# Patient Record
Sex: Female | Born: 1950 | Race: Black or African American | Hispanic: No | Smoking: Never smoker
Health system: Southern US, Community
[De-identification: ages and names within clinical notes are randomized; demographics above are authoritative.]

## PROBLEM LIST (undated history)

## (undated) DIAGNOSIS — M2550 Pain in unspecified joint: Secondary | ICD-10-CM

## (undated) DIAGNOSIS — Z531 Procedure and treatment not carried out because of patient's decision for reasons of belief and group pressure: Secondary | ICD-10-CM

## (undated) DIAGNOSIS — R112 Nausea with vomiting, unspecified: Secondary | ICD-10-CM

## (undated) DIAGNOSIS — J329 Chronic sinusitis, unspecified: Secondary | ICD-10-CM

## (undated) DIAGNOSIS — K219 Gastro-esophageal reflux disease without esophagitis: Secondary | ICD-10-CM

## (undated) DIAGNOSIS — IMO0001 Reserved for inherently not codable concepts without codable children: Secondary | ICD-10-CM

## (undated) DIAGNOSIS — H6690 Otitis media, unspecified, unspecified ear: Secondary | ICD-10-CM

## (undated) DIAGNOSIS — Z9289 Personal history of other medical treatment: Secondary | ICD-10-CM

## (undated) DIAGNOSIS — T7840XA Allergy, unspecified, initial encounter: Secondary | ICD-10-CM

## (undated) DIAGNOSIS — M1711 Unilateral primary osteoarthritis, right knee: Secondary | ICD-10-CM

## (undated) DIAGNOSIS — Z8489 Family history of other specified conditions: Secondary | ICD-10-CM

## (undated) DIAGNOSIS — M542 Cervicalgia: Secondary | ICD-10-CM

## (undated) DIAGNOSIS — M1990 Unspecified osteoarthritis, unspecified site: Secondary | ICD-10-CM

## (undated) DIAGNOSIS — T4145XA Adverse effect of unspecified anesthetic, initial encounter: Secondary | ICD-10-CM

## (undated) DIAGNOSIS — M549 Dorsalgia, unspecified: Secondary | ICD-10-CM

## (undated) DIAGNOSIS — R030 Elevated blood-pressure reading, without diagnosis of hypertension: Secondary | ICD-10-CM

## (undated) DIAGNOSIS — B360 Pityriasis versicolor: Secondary | ICD-10-CM

## (undated) DIAGNOSIS — T8859XA Other complications of anesthesia, initial encounter: Secondary | ICD-10-CM

## (undated) DIAGNOSIS — R002 Palpitations: Secondary | ICD-10-CM

## (undated) DIAGNOSIS — I10 Essential (primary) hypertension: Secondary | ICD-10-CM

## (undated) DIAGNOSIS — E2839 Other primary ovarian failure: Secondary | ICD-10-CM

## (undated) DIAGNOSIS — Z9889 Other specified postprocedural states: Secondary | ICD-10-CM

## (undated) DIAGNOSIS — Z8619 Personal history of other infectious and parasitic diseases: Secondary | ICD-10-CM

## (undated) DIAGNOSIS — R7611 Nonspecific reaction to tuberculin skin test without active tuberculosis: Secondary | ICD-10-CM

## (undated) DIAGNOSIS — E7800 Pure hypercholesterolemia, unspecified: Secondary | ICD-10-CM

## (undated) DIAGNOSIS — B353 Tinea pedis: Secondary | ICD-10-CM

## (undated) HISTORY — PX: APPENDECTOMY: SHX54

## (undated) HISTORY — DX: Personal history of other infectious and parasitic diseases: Z86.19

## (undated) HISTORY — PX: BACK SURGERY: SHX140

## (undated) HISTORY — DX: Nonspecific reaction to tuberculin skin test without active tuberculosis: R76.11

## (undated) HISTORY — DX: Palpitations: R00.2

## (undated) HISTORY — DX: Other primary ovarian failure: E28.39

## (undated) HISTORY — DX: Pure hypercholesterolemia, unspecified: E78.00

## (undated) HISTORY — PX: JOINT REPLACEMENT: SHX530

## (undated) HISTORY — DX: Allergy, unspecified, initial encounter: T78.40XA

---
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
## 1993-09-18 HISTORY — PX: MENISCUS REPAIR: SHX5179

## 1995-09-19 HISTORY — PX: KNEE ARTHROSCOPY: SHX127

## 1996-09-18 HISTORY — PX: ABDOMINAL HYSTERECTOMY: SHX81

## 1997-09-18 HISTORY — PX: OTHER SURGICAL HISTORY: SHX169

## 1998-04-14 ENCOUNTER — Ambulatory Visit (HOSPITAL_COMMUNITY): Admission: RE | Admit: 1998-04-14 | Discharge: 1998-04-14 | Payer: Self-pay | Admitting: Infectious Diseases

## 2009-03-25 ENCOUNTER — Emergency Department (HOSPITAL_BASED_OUTPATIENT_CLINIC_OR_DEPARTMENT_OTHER): Admission: EM | Admit: 2009-03-25 | Discharge: 2009-03-25 | Payer: Self-pay | Admitting: Emergency Medicine

## 2009-09-20 ENCOUNTER — Emergency Department (HOSPITAL_BASED_OUTPATIENT_CLINIC_OR_DEPARTMENT_OTHER): Admission: EM | Admit: 2009-09-20 | Discharge: 2009-09-20 | Payer: Self-pay | Admitting: Emergency Medicine

## 2009-09-20 ENCOUNTER — Ambulatory Visit: Payer: Self-pay | Admitting: Diagnostic Radiology

## 2010-07-21 ENCOUNTER — Ambulatory Visit: Payer: Self-pay | Admitting: Diagnostic Radiology

## 2010-07-21 ENCOUNTER — Emergency Department (HOSPITAL_BASED_OUTPATIENT_CLINIC_OR_DEPARTMENT_OTHER): Admission: EM | Admit: 2010-07-21 | Discharge: 2010-07-21 | Payer: Self-pay | Admitting: Emergency Medicine

## 2010-08-06 ENCOUNTER — Ambulatory Visit: Payer: Self-pay | Admitting: Diagnostic Radiology

## 2010-08-06 ENCOUNTER — Emergency Department (HOSPITAL_BASED_OUTPATIENT_CLINIC_OR_DEPARTMENT_OTHER): Admission: EM | Admit: 2010-08-06 | Discharge: 2010-08-06 | Payer: Self-pay | Admitting: Emergency Medicine

## 2010-11-29 LAB — POCT CARDIAC MARKERS
Myoglobin, poc: 49.5 ng/mL (ref 12–200)
Myoglobin, poc: 58.8 ng/mL (ref 12–200)
Troponin i, poc: <0.05 ng/mL (ref 0.00–0.09)

## 2010-11-29 LAB — BASIC METABOLIC PANEL
BUN: 11 mg/dL (ref 6–23)
CO2: 25 mEq/L (ref 19–32)
Calcium: 9.4 mg/dL (ref 8.4–10.5)
Chloride: 108 mEq/L (ref 96–112)
Creatinine, Ser: 0.9 mg/dL (ref 0.4–1.2)
Glucose, Bld: 85 mg/dL (ref 70–99)

## 2010-11-29 LAB — DIFFERENTIAL
Basophils Relative: 1 % (ref 0–1)
Eosinophils Absolute: 0.3 10*3/uL (ref 0.0–0.7)
Eosinophils Relative: 5 % (ref 0–5)
Lymphs Abs: 1.9 10*3/uL (ref 0.7–4.0)
Neutrophils Relative %: 52 % (ref 43–77)

## 2010-11-29 LAB — CBC
MCH: 31.5 pg (ref 26.0–34.0)
MCHC: 34.4 g/dL (ref 30.0–36.0)
MCV: 91.5 fL (ref 78.0–100.0)
Platelets: 250 10*3/uL (ref 150–400)
RBC: 4.09 MIL/uL (ref 3.87–5.11)

## 2011-12-08 ENCOUNTER — Encounter (HOSPITAL_BASED_OUTPATIENT_CLINIC_OR_DEPARTMENT_OTHER): Payer: Self-pay | Admitting: *Deleted

## 2011-12-08 ENCOUNTER — Emergency Department (HOSPITAL_BASED_OUTPATIENT_CLINIC_OR_DEPARTMENT_OTHER)
Admission: EM | Admit: 2011-12-08 | Discharge: 2011-12-08 | Disposition: A | Discharge status: Home / Self Care | Payer: BC Managed Care – PPO | Attending: Emergency Medicine | Admitting: Emergency Medicine

## 2011-12-08 DIAGNOSIS — J069 Acute upper respiratory infection, unspecified: Secondary | ICD-10-CM | POA: Insufficient documentation

## 2011-12-08 MED ORDER — HYDROCODONE-ACETAMINOPHEN 5-325 MG PO TABS
1.0000 | ORAL_TABLET | Freq: Once | ORAL | Status: AC
  Administered 2011-12-08: 1 via ORAL
  Filled 2011-12-08: qty 1

## 2011-12-08 MED ORDER — AZITHROMYCIN 250 MG PO TABS: 250.0000 mg | ORAL_TABLET | Freq: Every day | ORAL | Status: AC

## 2011-12-08 MED ORDER — IBUPROFEN 600 MG PO TABS: 600.0000 mg | ORAL_TABLET | Freq: Three times a day (TID) | ORAL | Status: AC | PRN

## 2011-12-08 NOTE — ED Notes (Signed)
MD at bedside. 

## 2011-12-08 NOTE — ED Provider Notes (Signed)
History     CSN: [id number]  Arrival date & time 12/08/11  4098   First MD Initiated Contact with Patient 12/08/11 [phone]      Chief Complaint  Patient presents with  . Hypertension     The history is provided by the patient.   patient reports having mild headache and sinus pressure over the past several days.  She checked her blood pressure and found it to be in the 170s over 100 range.  She rejected this morning he continued to be elevated and that she presented the ER for evaluation.  She still reports sinus discomfort and nasal congestion.  She's had no fever or chills.  She denies no unilateral arm or leg weakness.  She has no changes in her vision.  She has no primary care doctor.  She has a family history of high blood pressure.  The pressure on arrival to the ER is 145/78.  She has no photophobia or phonophobia.  There's been no report of altered mental status or confusion.  No nausea or vomiting.  No chest pain or shortness of breath.  No exertional chest pain or shortness of breath.  Denies abdominal pain nausea vomiting or diarrhea.  She's had no fever chills.  Nothing worsens or improves her symptoms.  Her symptoms are constant.  She is very concerned about her high blood pressure  History reviewed. No pertinent past medical history.  History reviewed. No pertinent past surgical history.  History reviewed. No pertinent family history.  History  Substance Use Topics  . Smoking status: Never Smoker   . Smokeless tobacco: Not on file  . Alcohol Use:     OB History    Grav Para Term Preterm Abortions TAB SAB Ect Mult Living                  Review of Systems  All other systems reviewed and are negative.    Allergies  Review of patient's allergies indicates no known allergies.  Home Medications   Current Outpatient Rx  Name Route Sig Dispense Refill  . AZITHROMYCIN 250 MG PO TABS Oral Take 1 tablet (250 mg total) by mouth daily. Take 2 tabs for first dose, then 1  tab for each additional dose 6 tablet 0    BP 145/78  Pulse 81  Temp(Src) 98.1 F (36.7 C) (Oral)  Resp 20  Ht 5\' 5"  (1.651 m)  Wt 170 lb (77.111 kg)  BMI 28.29 kg/m2  SpO2 99%  Physical Exam  Nursing note and vitals reviewed. Constitutional: She is oriented to person, place, and time. She appears well-developed and well-nourished. No distress.  HENT:  Head: Normocephalic and atraumatic.  Right Ear: External ear normal.  Left Ear: External ear normal.  Nose: Nose normal.  Mouth/Throat: Oropharynx is clear and moist.       Mild tenderness of maxillary sinuses bilaterally  Eyes: EOM are normal.  Neck: Normal range of motion.  Cardiovascular: Normal rate, regular rhythm and normal heart sounds.   Pulmonary/Chest: Effort normal and breath sounds normal.  Abdominal: Soft. She exhibits no distension. There is no tenderness.  Musculoskeletal: Normal range of motion.  Neurological: She is alert and oriented to person, place, and time.  Skin: Skin is warm and dry.  Psychiatric: She has a normal mood and affect. Judgment normal.    ED Course  Procedures (including critical care time)  Labs Reviewed - No data to display No results found.   1. Upper respiratory tract  infection       MDM  Patient presents with symptoms consistent with upper respiratory tract infection.  She's convention and a sinus infection.  Him on the fence at this time.  She will be written a prescription for azithromycin.  She's been encouraged to followup with aprimary care physician and has been given numbers to contact them.  She understands the importance of recheck of her blood pressures in an office setting        Lyanne Co, MD 12/08/11 [phone]

## 2011-12-08 NOTE — ED Notes (Addendum)
Pt requests prescription for pain medication "vicodin" for "neck pain". Pt waiting in room at this time.

## 2011-12-08 NOTE — ED Notes (Signed)
Pt amb to room 7 with quick steady gait smiling in nad. Pt reports her bp this am at home was 180's over 120's. bp now is 145/78. Pt states she has no hx of htn and has had some headaches off and on over the last few weeks.

## 2011-12-19 ENCOUNTER — Ambulatory Visit: Payer: BC Managed Care – PPO | Admitting: Family

## 2011-12-25 ENCOUNTER — Ambulatory Visit (HOSPITAL_BASED_OUTPATIENT_CLINIC_OR_DEPARTMENT_OTHER)
Admission: RE | Admit: 2011-12-25 | Discharge: 2011-12-25 | Disposition: A | Discharge status: Home / Self Care | Payer: BC Managed Care – PPO | Source: Ambulatory Visit | Attending: Family | Admitting: Family

## 2011-12-25 ENCOUNTER — Ambulatory Visit (INDEPENDENT_AMBULATORY_CARE_PROVIDER_SITE_OTHER): Payer: BC Managed Care – PPO | Admitting: Family

## 2011-12-25 ENCOUNTER — Encounter: Payer: Self-pay | Admitting: Family

## 2011-12-25 VITALS — BP 128/76 | HR 81 | Temp 98.1°F | Resp 16 | Ht 65.0 in | Wt 182.1 lb

## 2011-12-25 DIAGNOSIS — M47812 Spondylosis without myelopathy or radiculopathy, cervical region: Secondary | ICD-10-CM

## 2011-12-25 DIAGNOSIS — R030 Elevated blood-pressure reading, without diagnosis of hypertension: Secondary | ICD-10-CM | POA: Insufficient documentation

## 2011-12-25 DIAGNOSIS — M542 Cervicalgia: Secondary | ICD-10-CM

## 2011-12-25 DIAGNOSIS — J329 Chronic sinusitis, unspecified: Secondary | ICD-10-CM | POA: Insufficient documentation

## 2011-12-25 DIAGNOSIS — Z9289 Personal history of other medical treatment: Secondary | ICD-10-CM

## 2011-12-25 DIAGNOSIS — Z228 Carrier of other infectious diseases: Secondary | ICD-10-CM

## 2011-12-25 DIAGNOSIS — J45909 Unspecified asthma, uncomplicated: Secondary | ICD-10-CM

## 2011-12-25 DIAGNOSIS — J4520 Mild intermittent asthma, uncomplicated: Secondary | ICD-10-CM | POA: Diagnosis present

## 2011-12-25 HISTORY — DX: Elevated blood-pressure reading, without diagnosis of hypertension: R03.0

## 2011-12-25 HISTORY — DX: Unspecified asthma, uncomplicated: J45.909

## 2011-12-25 HISTORY — DX: Cervicalgia: M54.2

## 2011-12-25 HISTORY — DX: Personal history of other medical treatment: Z92.89

## 2011-12-25 MED ORDER — AMOXICILLIN-POT CLAVULANATE 875-125 MG PO TABS: 1.0000 | ORAL_TABLET | Freq: Two times a day (BID) | ORAL | Status: DC

## 2011-12-25 MED ORDER — FLUTICASONE PROPIONATE 50 MCG/ACT NA SUSP: 2.0000 | Freq: Every day | NASAL | Status: DC

## 2011-12-25 MED ORDER — MELOXICAM 7.5 MG PO TABS: 7.5000 mg | ORAL_TABLET | Freq: Every day | ORAL | Status: AC

## 2011-12-25 NOTE — Assessment & Plan Note (Signed)
BP looks good today.  Will monitor.

## 2011-12-25 NOTE — Patient Instructions (Signed)
Please complete your x-ray on the first floor.  Please follow up in 1 month (fasting) for complete physical. Welcome to [facility]!

## 2011-12-25 NOTE — Assessment & Plan Note (Signed)
Plan short term trial of mobic.  Will also obtain cervical x-ray to further evaluate.  Suspect DDD.

## 2011-12-25 NOTE — Assessment & Plan Note (Signed)
She received treatment as a child and is monitored with serial chest x-rays.

## 2011-12-25 NOTE — Assessment & Plan Note (Signed)
Pt with acute on chronic sinusitis.  Will rx with augmentin.  Will also add zyrtec and flonase to help with her chronic symptoms.
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
## 2011-12-25 NOTE — Assessment & Plan Note (Signed)
Currently stable with PRN albuterol.

## 2012-03-19 ENCOUNTER — Encounter: Payer: Self-pay | Admitting: Family

## 2012-03-19 ENCOUNTER — Ambulatory Visit (HOSPITAL_BASED_OUTPATIENT_CLINIC_OR_DEPARTMENT_OTHER)
Admission: RE | Admit: 2012-03-19 | Discharge: 2012-03-19 | Disposition: A | Discharge status: Home / Self Care | Payer: BC Managed Care – PPO | Source: Ambulatory Visit | Attending: Family | Admitting: Family

## 2012-03-19 ENCOUNTER — Other Ambulatory Visit: Payer: Self-pay | Admitting: Family

## 2012-03-19 ENCOUNTER — Encounter: Payer: Self-pay | Admitting: Internal Medicine

## 2012-03-19 ENCOUNTER — Ambulatory Visit (HOSPITAL_BASED_OUTPATIENT_CLINIC_OR_DEPARTMENT_OTHER): Admission: RE | Admit: 2012-03-19 | Payer: BC Managed Care – PPO | Source: Ambulatory Visit

## 2012-03-19 ENCOUNTER — Other Ambulatory Visit (HOSPITAL_COMMUNITY)
Admission: RE | Admit: 2012-03-19 | Discharge: 2012-03-19 | Disposition: A | Discharge status: Home / Self Care | Payer: BC Managed Care – PPO | Source: Ambulatory Visit | Attending: Family | Admitting: Family

## 2012-03-19 ENCOUNTER — Ambulatory Visit (INDEPENDENT_AMBULATORY_CARE_PROVIDER_SITE_OTHER): Payer: BC Managed Care – PPO | Admitting: Family

## 2012-03-19 VITALS — BP 114/80 | HR 74 | Temp 97.7°F | Resp 16 | Ht 65.0 in | Wt 179.0 lb

## 2012-03-19 DIAGNOSIS — R030 Elevated blood-pressure reading, without diagnosis of hypertension: Secondary | ICD-10-CM

## 2012-03-19 DIAGNOSIS — M25552 Pain in left hip: Secondary | ICD-10-CM

## 2012-03-19 DIAGNOSIS — Z01419 Encounter for gynecological examination (general) (routine) without abnormal findings: Secondary | ICD-10-CM | POA: Insufficient documentation

## 2012-03-19 DIAGNOSIS — Z0000 Encounter for general adult medical examination without abnormal findings: Secondary | ICD-10-CM

## 2012-03-19 DIAGNOSIS — H6690 Otitis media, unspecified, unspecified ear: Secondary | ICD-10-CM

## 2012-03-19 DIAGNOSIS — B353 Tinea pedis: Secondary | ICD-10-CM

## 2012-03-19 DIAGNOSIS — B360 Pityriasis versicolor: Secondary | ICD-10-CM

## 2012-03-19 DIAGNOSIS — Z1231 Encounter for screening mammogram for malignant neoplasm of breast: Secondary | ICD-10-CM

## 2012-03-19 DIAGNOSIS — M2550 Pain in unspecified joint: Secondary | ICD-10-CM

## 2012-03-19 DIAGNOSIS — M542 Cervicalgia: Secondary | ICD-10-CM

## 2012-03-19 DIAGNOSIS — M25559 Pain in unspecified hip: Secondary | ICD-10-CM | POA: Insufficient documentation

## 2012-03-19 HISTORY — DX: Pityriasis versicolor: B36.0

## 2012-03-19 HISTORY — DX: Pain in unspecified joint: M25.50

## 2012-03-19 HISTORY — DX: Tinea pedis: B35.3

## 2012-03-19 HISTORY — DX: Otitis media, unspecified, unspecified ear: H66.90

## 2012-03-19 LAB — HEPATIC FUNCTION PANEL
Alkaline Phosphatase: 58 U/L (ref 39–117)
Bilirubin, Direct: 0.1 mg/dL (ref 0.0–0.3)
Indirect Bilirubin: 0.3 mg/dL (ref 0.0–0.9)
Total Bilirubin: 0.4 mg/dL (ref 0.3–1.2)

## 2012-03-19 LAB — LIPID PANEL
HDL: 74 mg/dL (ref >39)
LDL Cholesterol: 121 mg/dL — ABNORMAL HIGH (ref 0–99)
Total CHOL/HDL Ratio: 2.8 Ratio
VLDL: 14 mg/dL (ref 0–40)

## 2012-03-19 LAB — CBC WITH DIFFERENTIAL/PLATELET
Basophils Absolute: 0 10*3/uL (ref 0.0–0.1)
HCT: 38.5 % (ref 36.0–46.0)
Lymphocytes Relative: 38 % (ref 12–46)
Lymphs Abs: 1.7 10*3/uL (ref 0.7–4.0)
Neutro Abs: 2.4 10*3/uL (ref 1.7–7.7)
Platelets: 266 10*3/uL (ref 150–400)
RBC: 4.34 MIL/uL (ref 3.87–5.11)
RDW: 13.8 % (ref 11.5–15.5)
WBC: 4.5 10*3/uL (ref 4.0–10.5)

## 2012-03-19 LAB — BASIC METABOLIC PANEL WITH GFR
CO2: 27 mEq/L (ref 19–32)
Chloride: 103 mEq/L (ref 96–112)
GFR, Est Non African American: 64 mL/min
Potassium: 4.6 mEq/L (ref 3.5–5.3)

## 2012-03-19 LAB — TSH: TSH: 1.153 u[IU]/mL (ref 0.350–4.500)

## 2012-03-19 MED ORDER — AMOXICILLIN 875 MG PO TABS: 875.0000 mg | ORAL_TABLET | Freq: Two times a day (BID) | ORAL | Status: AC

## 2012-03-19 MED ORDER — SELENIUM SULFIDE 2.5 % EX LOTN: TOPICAL_LOTION | Freq: Every day | CUTANEOUS | Status: DC | PRN

## 2012-03-19 NOTE — Progress Notes (Signed)
Subjective:    Patient ID: April Hunt, female    DOB: 06-Mar-1951, 61 y.o.   MRN: [id number]  HPI  April Hunt is a 61 yr old female who presents today for CPX. Pt states it has been years since her last colonoscopy and DEXA. Last pap smear in 2012?, last mammogram 75yrs ago. Up to date with tetanus and flu vaccine. Last pneumovax was 10 yrs ago.  Neck pain- Reports that this is very improved.  Hip pain- Reports left hip pain.  She puts a heating pad, pillow between knees during sleep helps.  This has been going on for a "while."   Skin rash- reports hyperpigmented rash on back.   Review of Systems  Constitutional: Negative for unexpected weight change.  HENT: Negative for congestion.   Eyes: Negative for visual disturbance.  Respiratory: Negative for cough.   Cardiovascular: Negative for chest pain and leg swelling.  Gastrointestinal: Negative for nausea, vomiting and diarrhea.  Genitourinary: Negative for dysuria and frequency.  Musculoskeletal: Positive for back pain and arthralgias.  Skin: Positive for rash.  Neurological:       Sinus headaches  Hematological: Negative for adenopathy.  Psychiatric/Behavioral:       Denies depression/anxiety   Neck pain is much better.    Past Medical History  Diagnosis Date  . Gestational diabetes   . Asthma     SEASONAL  . History of chicken pox   . Allergy     seasonal  . Positive PPD, treated   . History of shingles     History   Social History  . Marital Status: Divorced    Spouse Name: N/A    Number of Children: 1  . Years of Education: N/A   Occupational History  . TEACHER [employer]   Social History Main Topics  . Smoking status: Never Smoker   . Smokeless tobacco: Never Used  . Alcohol Use: Yes     rarely  . Drug Use: Not on file  . Sexually Active: Not on file   Other Topics Concern  . 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 Abdominal hysterectomy 1998    Still has cervix and both ovaries  . Meniscus repair 1995    left knee  . Knee arthroscopy 1997    right knee  . Lymph node resection 1999    benign    Family History  Problem Relation Age of Onset  . Hyperlipidemia Mother   . Hypertension Mother   . Diabetes Father   . Tuberculosis Father   . Heart disease Paternal Aunt   . Cancer Paternal Uncle     colon cancer  . Cancer Cousin     breast    Allergies  Allergen Reactions  . Aspirin Nausea Only  . Codeine Other (See Comments)    confusion    Current Outpatient Prescriptions on File Prior to Visit  Medication Sig Dispense Refill  . b complex vitamins capsule Take 2 capsules by mouth daily.      . Calcium Carbonate-Vitamin D (CALCIUM-VITAMIN D) 600-200 MG-UNIT CAPS Take 4 capsules by mouth daily.      . cetirizine (ZYRTEC) 10 MG tablet Take 10 mg by mouth daily.      Marland Kitchen  fluticasone (FLONASE) 50 MCG/ACT nasal spray Place 2 sprays into the nose daily.  1 g  2  . meloxicam (MOBIC) 7.5 MG tablet Take 1 tablet (7.5 mg total) by mouth daily.  30 tablet  0  . Multiple Vitamins-Minerals (MULTIVITAMIN WITH MINERALS) tablet Take 2 tablets by mouth daily.      . NON FORMULARY Rosemary capsule. 2-4 capsules daily.      . NON FORMULARY Horsetail capsule.  Take 2-4 daily.      . Omega-3 Fatty Acids (FISH OIL) 600 MG CAPS Take 2-4 capsules by mouth daily.        BP 114/80  Pulse 74  Temp 97.7 F (36.5 C) (Oral)  Resp 16  Ht 5\' 5"  (1.651 m)  Wt 179 lb (81.194 kg)  BMI 29.79 kg/m2  SpO2 97%       Objective:   Physical Exam   Physical Exam  Constitutional: She is oriented to person, place, and time. She appears well-developed and well-nourished. No distress.  HENT:  Head: Normocephalic and atraumatic.  Right  Ear: Tympanic membrane erythema is noted.  Canal is normal.   Left Ear: Tympanic membrane and ear canal normal.  Mouth/Throat: Oropharynx is clear and moist.  Eyes: Pupils are equal, round, and reactive to light. No scleral icterus.  Neck: Normal range of motion. No thyromegaly present.  Cardiovascular: Normal rate and regular rhythm.   No murmur heard. Pulmonary/Chest: Effort normal and breath sounds normal. No respiratory distress. He has no wheezes. She has no rales. She exhibits no tenderness.  Abdominal: Soft. Bowel sounds are normal. He exhibits no distension and no mass. There is no tenderness. There is no rebound and no guarding.  Musculoskeletal: She exhibits no edema.  Lymphadenopathy:    She has no cervical adenopathy.  Neurological: She is alert and oriented to person, place, and time. She has normal reflexes. She exhibits normal muscle tone. Coordination normal.  Skin: Skin is warm and dry. Hyperpigmented rash noted on back.  Dry peeling skin on soles of feet.  Fingernails are thick and hypertrophic Psychiatric: She has a normal mood and affect. Her behavior is normal. Judgment and thought content normal.  Breasts: Examined lying Right: Without masses, retractions, discharge or axillary adenopathy. R nipple inverted Left: Without masses, retractions, discharge or axillary adenopathy.  Inguinal/mons: Normal without inguinal adenopathy  External genitalia: Normal  BUS/Urethra/Skene's glands: Normal  Bladder: Normal  Vagina: Normal  Cervix: Normal (pap smear performed with chaperone) Uterus: surgically absent Adnexa/parametria:  Rt: Without masses or tenderness.  Lt: Without masses or tenderness.  Anus and perineum: Normal           Assessment & Plan:          Assessment & Plan:

## 2012-03-19 NOTE — Patient Instructions (Addendum)
Please complete your lab work prior to leaving. You will be contact about your referral for colonoscopy.  Please let us know if you have not heard back within 1 week about your referral. Please schedule your mammogram on the first floor and your hip x-ray. We will contact you with your results of your lab work and pap smear. Follow up in 6 months- sooner if problems/concerns.

## 2012-03-19 NOTE — Assessment & Plan Note (Signed)
Treat with amoxicillin

## 2012-03-19 NOTE — Addendum Note (Signed)
Addended by: Mervin Kung A on: 03/19/2012 01:13 PM   Modules accepted: Orders

## 2012-03-19 NOTE — Assessment & Plan Note (Signed)
Improved. Monitor.  

## 2012-03-19 NOTE — Assessment & Plan Note (Signed)
BP looks good today.  Continue to monitor.

## 2012-03-19 NOTE — Assessment & Plan Note (Addendum)
Pt with bilateral knee pain and hip pain.   Obtain x ray of left hip.  Tylenol PRN.  Handicapped placard applications completed today.

## 2012-03-19 NOTE — Assessment & Plan Note (Signed)
Trial of selsun.

## 2012-03-19 NOTE — Assessment & Plan Note (Signed)
Immunizations reviewed and up to date.  Pap performed.  Due for mammo, dexa, colo- ordered.  Obtain fasting labs.  EKG performed and reviewed- normal.

## 2012-03-19 NOTE — Assessment & Plan Note (Signed)
Trial of otc antifungal spray.

## 2012-03-20 LAB — URINALYSIS, ROUTINE W REFLEX MICROSCOPIC
Hgb urine dipstick: NEGATIVE
Ketones, ur: NEGATIVE mg/dL
Nitrite: NEGATIVE
Protein, ur: NEGATIVE mg/dL
Urobilinogen, UA: 0.2 mg/dL (ref 0.0–1.0)
pH: 5.5 (ref 5.0–8.0)

## 2012-03-22 ENCOUNTER — Encounter: Payer: Self-pay | Admitting: Family

## 2012-04-25 ENCOUNTER — Encounter: Payer: BC Managed Care – PPO | Admitting: Internal Medicine

## 2012-07-19 ENCOUNTER — Encounter: Payer: Self-pay | Admitting: Internal Medicine

## 2012-07-19 ENCOUNTER — Ambulatory Visit (INDEPENDENT_AMBULATORY_CARE_PROVIDER_SITE_OTHER): Payer: BC Managed Care – PPO | Admitting: Internal Medicine

## 2012-07-19 VITALS — BP 122/82 | HR 71 | Temp 98.2°F | Resp 16 | Wt 179.8 lb

## 2012-07-19 DIAGNOSIS — M549 Dorsalgia, unspecified: Secondary | ICD-10-CM

## 2012-07-19 MED ORDER — METHYLPREDNISOLONE 4 MG PO KIT: PACK | ORAL | Status: DC

## 2012-07-19 MED ORDER — KETOROLAC TROMETHAMINE 30 MG/ML IM SOLN: 30.0000 mg | Freq: Once | INTRAMUSCULAR | Status: DC

## 2012-07-19 MED ORDER — CYCLOBENZAPRINE HCL 5 MG PO TABS: 5.0000 mg | ORAL_TABLET | Freq: Three times a day (TID) | ORAL | Status: DC | PRN

## 2012-07-21 DIAGNOSIS — M549 Dorsalgia, unspecified: Secondary | ICD-10-CM | POA: Insufficient documentation

## 2012-07-21 HISTORY — DX: Dorsalgia, unspecified: M54.9

## 2012-07-21 NOTE — Assessment & Plan Note (Signed)
Provided with Toradol IM injection 30 mg. Attempt Flexeril 3 times a day when necessary-cautioned regarding possible sedating effect. Begin Medrol Dosepak. Followup if no improvement or worsening.

## 2012-07-21 NOTE — Progress Notes (Signed)
  Subjective:    Patient ID: April Hunt, female    DOB: 1951/05/03, 61 y.o.   MRN: [id number]  HPI patient presents to clinic for evaluation of back pain. Notes left low back pain for approximately 2 weeks. No associated leg radicular pain, paresthesia or leg weakness. Pain worsened by position change. No urinary symptoms. Also notes chronic intermittent tingling of left hand fingers is worsening. No focal arm or hand weakness.  Past Medical History  Diagnosis Date  . Gestational diabetes   . Asthma     SEASONAL  . History of chicken pox   . Allergy     seasonal  . Positive PPD, treated   . History of shingles    Past Surgical History  Procedure Date  . Abdominal hysterectomy 1998    Still has cervix and both ovaries  . Meniscus repair 1995    left knee  . Knee arthroscopy 1997    right knee  . Lymph node resection 1999    benign    reports that she has never smoked. She has never used smokeless tobacco. She reports that she drinks alcohol. Her drug history not on file. family history includes Cancer in her cousin and paternal uncle; Diabetes in her father; Heart disease in her paternal aunt; Hyperlipidemia in her mother; Hypertension in her mother; and Tuberculosis in her father. Allergies  Allergen Reactions  . Aspirin Nausea Only  . Codeine Other (See Comments)    confusion     Review of Systems see history of present illness     Objective:   Physical Exam  Nursing note and vitals reviewed. Constitutional: She appears well-developed and well-nourished. No distress.  HENT:  Head: Normocephalic and atraumatic.  Eyes: Conjunctivae normal are normal. No scleral icterus.  Musculoskeletal:       Full range of motion neck, left arm and bilateral legs. Bilateral lower extremity strength 5/5. Left distal MCP and intertriginous muscle strength 5/5. no muscle atrophy noted  Neurological: She is alert.  Skin: Skin is warm and dry. She is not diaphoretic.  Psychiatric:  She has a normal mood and affect.          Assessment & Plan:

## 2012-08-02 MED ORDER — KETOROLAC TROMETHAMINE 30 MG/ML IJ SOLN: 30.0000 mg | Freq: Once | INTRAMUSCULAR | Status: AC

## 2012-08-02 NOTE — Addendum Note (Signed)
Addended by: Regis Bill on: 08/02/2012 11:04 AM   Modules accepted: Orders

## 2012-10-28 ENCOUNTER — Encounter: Payer: Self-pay | Admitting: Family Medicine

## 2012-10-28 ENCOUNTER — Ambulatory Visit (INDEPENDENT_AMBULATORY_CARE_PROVIDER_SITE_OTHER): Payer: BC Managed Care – PPO | Admitting: Family Medicine

## 2012-10-28 VITALS — BP 136/70 | HR 90 | Temp 98.3°F | Ht 66.5 in | Wt 180.4 lb

## 2012-10-28 DIAGNOSIS — J329 Chronic sinusitis, unspecified: Secondary | ICD-10-CM

## 2012-10-28 MED ORDER — PROMETHAZINE-DM 6.25-15 MG/5ML PO SYRP: 5.0000 mL | ORAL_SOLUTION | Freq: Four times a day (QID) | ORAL | Status: DC | PRN

## 2012-10-28 MED ORDER — AMOXICILLIN-POT CLAVULANATE 875-125 MG PO TABS: 1.0000 | ORAL_TABLET | Freq: Two times a day (BID) | ORAL | Status: AC

## 2012-10-28 NOTE — Progress Notes (Signed)
  Subjective:    Patient ID: April Hunt, female    DOB: 1951-05-07, 62 y.o.   MRN: [id number]  HPI URI- 'i just feel real bad'.  sxs started last weekend w/ gradual onset but 'the bottom fell out on Wednesday'.  + HA, vertigo, chest congestion, dry cough, nasal congestion.  + facial pain/pressure, L ear pain- sharp.  Alternating tylenol/ibuprofen.  Having chills and sweats.  No documented fever.  Is a Runner, broadcasting/film/video, + sick contacts.  Has been out of work since Thursday.   Review of Systems For ROS see HPI     Objective:   Physical Exam  Constitutional: She appears well-developed and well-nourished. No distress.  HENT:  Head: Normocephalic and atraumatic.  Right Ear: Tympanic membrane normal.  Left Ear: Tympanic membrane normal.  Nose: Mucosal edema and rhinorrhea present. Right sinus exhibits maxillary sinus tenderness and frontal sinus tenderness. Left sinus exhibits maxillary sinus tenderness and frontal sinus tenderness.  Mouth/Throat: Uvula is midline and mucous membranes are normal. Posterior oropharyngeal erythema present. No oropharyngeal exudate.  Eyes: Conjunctivae and EOM are normal. Pupils are equal, round, and reactive to light.  Neck: Normal range of motion. Neck supple.  Cardiovascular: Normal rate, regular rhythm and normal heart sounds.   Pulmonary/Chest: Effort normal and breath sounds normal. No respiratory distress. She has no wheezes.  + hacking cough  Lymphadenopathy:    She has no cervical adenopathy.          Assessment & Plan:

## 2012-10-28 NOTE — Assessment & Plan Note (Signed)
New to provider.  Start abx.  Cough meds prn.  Reviewed supportive care and red flags that should prompt return.  Pt expressed understanding and is in agreement w/ plan.

## 2012-10-28 NOTE — Patient Instructions (Addendum)
Start the Augmentin twice daily- take w/ food- for the sinus infection Use the cough syrup as needed- will cause drowsiness OTC Mucinex to thin your congestion Drink plenty of fluids Continue to alternate tylenol and ibuprofen for pain/fever REST! Hang in there!!!

## 2013-03-04 ENCOUNTER — Ambulatory Visit (INDEPENDENT_AMBULATORY_CARE_PROVIDER_SITE_OTHER): Payer: BC Managed Care – PPO | Admitting: Family

## 2013-03-04 ENCOUNTER — Encounter: Payer: Self-pay | Admitting: Family

## 2013-03-04 VITALS — BP 130/84 | HR 75 | Temp 98.2°F | Wt 181.0 lb

## 2013-03-04 DIAGNOSIS — J329 Chronic sinusitis, unspecified: Secondary | ICD-10-CM

## 2013-03-04 DIAGNOSIS — M549 Dorsalgia, unspecified: Secondary | ICD-10-CM

## 2013-03-04 DIAGNOSIS — B360 Pityriasis versicolor: Secondary | ICD-10-CM

## 2013-03-04 HISTORY — DX: Chronic sinusitis, unspecified: J32.9

## 2013-03-04 MED ORDER — KETOROLAC TROMETHAMINE 30 MG/ML IJ SOLN
30.0000 mg | Freq: Once | INTRAMUSCULAR | Status: AC
  Administered 2013-03-04: 30 mg via INTRAMUSCULAR

## 2013-03-04 MED ORDER — CYCLOBENZAPRINE HCL 5 MG PO TABS: 5.0000 mg | ORAL_TABLET | Freq: Three times a day (TID) | ORAL | Status: DC | PRN

## 2013-03-04 MED ORDER — METHYLPREDNISOLONE 4 MG PO KIT: PACK | ORAL | Status: DC

## 2013-03-04 MED ORDER — AMOXICILLIN-POT CLAVULANATE 875-125 MG PO TABS: 1.0000 | ORAL_TABLET | Freq: Two times a day (BID) | ORAL | Status: DC

## 2013-03-04 MED ORDER — SELENIUM SULFIDE 2.5 % EX LOTN: TOPICAL_LOTION | Freq: Every day | CUTANEOUS | Status: DC | PRN

## 2013-03-04 NOTE — Progress Notes (Signed)
Subjective:    Patient ID: April Hunt, female    DOB: January 09, 1951, 61 y.o.   MRN: [id number]  HPI  April Hunt is a 62 yr old female who presents today with several concerns.   1) Back pain-  Pain is located on the left side of her back and radiates to her left leg.  Pain started 3 days ago and is described as severe 10/10.  Pains worsened by walking. She has a TENS unit which she tried and this worsened the pain. She was treated for similar symptoms 11/13 and was given IM toradol, medrol dose pak and flexeril PRN.  She reports that this regimen helped her a lot.    2) Back rash- located on back, upper arms and neck.  Rash is described as pruritic.  Was treated for tinea versicolor 2013 and has run out of the selsun lotion.  3) Sinus pressure-   Reports HA, facial pressure, congestion, eye itching.  Hearing is mildly impaired in the right ear. Eyes have been running.  + pain in both cheeks.  Unable to blow anything out of her nose. She has post nasal drip.   Review of Systems See HPI    Past Medical History  Diagnosis Date  . Gestational diabetes   . Asthma     SEASONAL  . History of chicken pox   . Allergy     seasonal  . Positive PPD, treated   . History of shingles     History   Social History  . Marital Status: Divorced    Spouse Name: N/A    Number of Children: 1  . Years of Education: N/A   Occupational History  . TEACHER [employer]   Social History Main Topics  . Smoking status: Never Smoker   . Smokeless tobacco: Never Used  . Alcohol Use: Yes     Comment: rarely  . Drug Use: Not on file  . Sexually Active: Not on file   Other Topics Concern  . Not on file   Social History Narrative   Caffeine Use: "all the time"   Regular exercise:  Walks daily.   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   Divorced   She has one son age 31.   Lives with her mother and her dog April Hunt    Past Surgical History  Procedure  Laterality Date  . Abdominal hysterectomy  1998    Still has cervix and both ovaries  . Meniscus repair  1995    left knee  . Knee arthroscopy  1997    right knee  . Lymph node resection  1999    benign    Family History  Problem Relation Age of Onset  . Hyperlipidemia Mother   . Hypertension Mother   . Diabetes Father   . Tuberculosis Father   . Heart disease Paternal Aunt   . Cancer Paternal Uncle     colon cancer  . Cancer Cousin     breast    Allergies  Allergen Reactions  . Aspirin Nausea Only  . Codeine Other (See Comments)    confusion    Current Outpatient Prescriptions on File Prior to Visit  Medication Sig Dispense Refill  . b complex vitamins capsule Take 2 capsules by mouth daily.      . Calcium Carbonate-Vitamin D (CALCIUM-VITAMIN D) 600-200 MG-UNIT CAPS Take 4 capsules by mouth daily.      . cetirizine (ZYRTEC) 10  MG tablet Take 10 mg by mouth daily.      . cyclobenzaprine (FLEXERIL) 5 MG tablet Take 1 tablet (5 mg total) by mouth 3 (three) times daily as needed for muscle spasms.  20 tablet  1  . Multiple Vitamins-Minerals (MULTIVITAMIN WITH MINERALS) tablet Take 2 tablets by mouth daily.      . NON FORMULARY Rosemary capsule. 2-4 capsules daily.      . NON FORMULARY Horsetail capsule.  Take 2-4 daily.      . Omega-3 Fatty Acids (FISH OIL) 600 MG CAPS Take 2-4 capsules by mouth daily.       No current facility-administered medications on file prior to visit.    BP 130/84  Pulse 75  Temp(Src) 98.2 F (36.8 C) (Oral)  Wt 181 lb (82.101 kg)  BMI 28.78 kg/m2  SpO2 98%        Objective:   Physical Exam  Constitutional: She is oriented to person, place, and time. She appears well-developed and well-nourished. No distress.  HENT:  Right Ear: Tympanic membrane and ear canal normal.  Left Ear: Tympanic membrane and ear canal normal.  Mouth/Throat: No oropharyngeal exudate, posterior oropharyngeal edema or posterior oropharyngeal erythema.   Cardiovascular: Normal rate and regular rhythm.   No murmur heard. Pulmonary/Chest: Effort normal and breath sounds normal. No respiratory distress. She has no wheezes. She has no rales. She exhibits no tenderness.  Musculoskeletal:       Thoracic back: She exhibits no tenderness.       Lumbar back: She exhibits no tenderness.  Bilateral LE strength 5/5  Neurological: She is alert and oriented to person, place, and time.  Reflex Scores:      Patellar reflexes are 2+ on the right side and 2+ on the left side. Skin:  Mild tinea versicolor noted on bilateral arms.            Assessment & Plan:

## 2013-03-04 NOTE — Patient Instructions (Addendum)
Please call if symptoms worsen, or if not improved in 1 week.

## 2013-03-04 NOTE — Assessment & Plan Note (Signed)
Will rx with augmentin.  

## 2013-03-04 NOTE — Assessment & Plan Note (Signed)
Refill provided for selsun. 

## 2013-03-04 NOTE — Assessment & Plan Note (Signed)
IM toradol given today in office. To be followed by medrol dose pak and flexeril prn- she is aware not to drive after taking flexeril.

## 2013-03-04 NOTE — Progress Notes (Signed)
  Subjective:    Patient ID: April Hunt, female    DOB: October 24, 1950, 62 y.o.   MRN: [id number]  HPI  April Hunt is a 62 year old female who presents with constant Left sided back pain radiating down her Left leg to toes for past 3 days. Pain has been increasing and described as severe 10/10 shooting pains. Pain worsened by walking. TENS system made pain worse. Treated for back pain 07/2012 with toradol, flexaril PRN, and medrol dose pack.  Back rash- on back, upper arms, and neck. At times rash is itchy, especially with moisture. Described as similar to Tinea vesicolor rash she has had in past in which [place] helped.   Sinus trouble- Headache, facial pressure, congestion, eye itching, ear fullness in right ear since spring. States hearing is mildly impaired in R ear. Long history of sinus problems and flares, especially in spring.   Review of Systems  Constitutional: Positive for activity change (fatigue ).  HENT: Positive for ear pain (fullness), congestion, sinus pressure and ear discharge.   Eyes: Positive for itching. Negative for pain and discharge.  Respiratory: Negative for cough and shortness of breath.   Musculoskeletal: Positive for back pain (left sided, radiating down left leg to toes).  Skin: Positive for rash.  Neurological: Negative for dizziness, light-headedness and numbness.  Psychiatric/Behavioral: Negative.        Objective:   Physical Exam  Constitutional: She is oriented to person, place, and time. She appears well-developed and well-nourished.  HENT:  Head: Normocephalic and atraumatic.  Eyes: Right eye exhibits no discharge. Left eye exhibits no discharge.  Cardiovascular: Normal rate and regular rhythm.   Pulmonary/Chest: Effort normal and breath sounds normal. No respiratory distress. She has no wheezes. She has no rales. She exhibits no tenderness.  Musculoskeletal:       Right shoulder: She exhibits pain.       Lumbar back: She exhibits tenderness (left  lower back) and pain.  Left lower back pain   Neurological: She is alert and oriented to person, place, and time. She has normal reflexes. Coordination normal.  Skin: Rash (recurrent tinea vesicolor on upper arms, upper back, and chest) noted.  Psychiatric: She has a normal mood and affect. Her behavior is normal. Judgment and thought content normal.          Assessment & Plan:

## 2013-03-05 ENCOUNTER — Telehealth: Payer: Self-pay | Admitting: Family

## 2013-03-05 MED ORDER — HYDROCODONE-ACETAMINOPHEN 5-325 MG PO TABS: 1.0000 | ORAL_TABLET | Freq: Four times a day (QID) | ORAL | Status: DC | PRN

## 2013-03-05 NOTE — Telephone Encounter (Signed)
Patient states that her pain is much worse today since yesterdays visit and would like to know what Melissa recommends she do?

## 2013-03-05 NOTE — Telephone Encounter (Signed)
Notified pt with Mellissa response. Called rx into [facility] @ [phone]. Spoke with Kathryn/pharmacist. Epic updated...Raechel Chute

## 2013-03-05 NOTE — Telephone Encounter (Signed)
Can add short course of hydrocodone/APAP 5/325 1 tab every 6 hours as needed for pain, #20 no refills.  Call if no improvement in 3 days, or if she develops leg numbness/weakness.  Don't drive after taking this medication.

## 2013-05-10 ENCOUNTER — Emergency Department (HOSPITAL_BASED_OUTPATIENT_CLINIC_OR_DEPARTMENT_OTHER): Payer: BC Managed Care – PPO

## 2013-05-10 ENCOUNTER — Encounter (HOSPITAL_BASED_OUTPATIENT_CLINIC_OR_DEPARTMENT_OTHER): Payer: Self-pay

## 2013-05-10 ENCOUNTER — Emergency Department (HOSPITAL_BASED_OUTPATIENT_CLINIC_OR_DEPARTMENT_OTHER)
Admission: EM | Admit: 2013-05-10 | Discharge: 2013-05-10 | Disposition: A | Discharge status: Home / Self Care | Payer: BC Managed Care – PPO | Attending: Emergency Medicine | Admitting: Emergency Medicine

## 2013-05-10 DIAGNOSIS — R6883 Chills (without fever): Secondary | ICD-10-CM | POA: Insufficient documentation

## 2013-05-10 DIAGNOSIS — Z8619 Personal history of other infectious and parasitic diseases: Secondary | ICD-10-CM | POA: Insufficient documentation

## 2013-05-10 DIAGNOSIS — Z79899 Other long term (current) drug therapy: Secondary | ICD-10-CM | POA: Insufficient documentation

## 2013-05-10 DIAGNOSIS — J45909 Unspecified asthma, uncomplicated: Secondary | ICD-10-CM | POA: Insufficient documentation

## 2013-05-10 DIAGNOSIS — J0100 Acute maxillary sinusitis, unspecified: Secondary | ICD-10-CM | POA: Insufficient documentation

## 2013-05-10 DIAGNOSIS — H53149 Visual discomfort, unspecified: Secondary | ICD-10-CM | POA: Insufficient documentation

## 2013-05-10 DIAGNOSIS — J3489 Other specified disorders of nose and nasal sinuses: Secondary | ICD-10-CM | POA: Insufficient documentation

## 2013-05-10 DIAGNOSIS — R110 Nausea: Secondary | ICD-10-CM | POA: Insufficient documentation

## 2013-05-10 MED ORDER — AMOXICILLIN-POT CLAVULANATE 875-125 MG PO TABS: 1.0000 | ORAL_TABLET | Freq: Two times a day (BID) | ORAL | Status: DC

## 2013-05-10 MED ORDER — FLUTICASONE PROPIONATE 50 MCG/ACT NA SUSP: 2.0000 | Freq: Every day | NASAL | Status: DC

## 2013-05-10 MED ORDER — DIPHENHYDRAMINE HCL 50 MG/ML IJ SOLN
25.0000 mg | Freq: Once | INTRAMUSCULAR | Status: AC
  Administered 2013-05-10: 25 mg via INTRAMUSCULAR
  Filled 2013-05-10: qty 1

## 2013-05-10 MED ORDER — KETOROLAC TROMETHAMINE 60 MG/2ML IM SOLN
60.0000 mg | Freq: Once | INTRAMUSCULAR | Status: AC
  Administered 2013-05-10: 60 mg via INTRAMUSCULAR
  Filled 2013-05-10: qty 2

## 2013-05-10 NOTE — ED Notes (Signed)
Patient here with right temporal headache x 1 week. Thought it was sinus infection and taking otc meds w/o relief. Denies trauma. Alert and oriented

## 2013-05-10 NOTE — ED Provider Notes (Signed)
Medical screening examination/treatment/procedure(s) were performed by non-physician practitioner and as supervising physician I was immediately available for consultation/collaboration.   Donald Jacque M Lakaya Tolen, MD 05/10/13 2024 

## 2013-05-10 NOTE — ED Provider Notes (Signed)
CSN: [id number]     Arrival date & time 05/10/13  1457 History     First MD Initiated Contact with Patient 05/10/13 1739     Chief Complaint  Patient presents with  . Headache   (Consider location/radiation/quality/duration/timing/severity/associated sxs/prior Treatment) HPI Comments: Patient is a 62 year old female with a past medical history of asthma who presents with a headache for the past week. Patient reports a gradual onset and progressive worsening of the headache. The pain is sharp, constant and is located in right temporal and right anterior face without radiation. Patient has tried amoxicillin, flonase, and OTC medication for symptoms without relief. No alleviating/aggravating factors. Patient reports associated nausea, chills and photophobia. Patient denies fever, vomiting, diarrhea, numbness/tingling, weakness, visual changes, congestion, chest pain, SOB, abdominal pain.     Patient is a 62 y.o. female presenting with headaches.  Headache Associated symptoms: congestion and sinus pressure     Past Medical History  Diagnosis Date  . Asthma     SEASONAL  . History of chicken pox   . Allergy     seasonal  . Positive PPD, treated   . History of shingles    Past Surgical History  Procedure Laterality Date  . Abdominal hysterectomy  1998    Still has cervix and both ovaries  . Meniscus repair  1995    left knee  . Knee arthroscopy  1997    right knee  . Lymph node resection  1999    benign   Family History  Problem Relation Age of Onset  . Hyperlipidemia Mother   . Hypertension Mother   . Diabetes Father   . Tuberculosis Father   . Heart disease Paternal Aunt   . Cancer Paternal Uncle     colon cancer  . Cancer Cousin     breast   History  Substance Use Topics  . Smoking status: Never Smoker   . Smokeless tobacco: Never Used  . Alcohol Use: Yes     Comment: rarely   OB History   Grav Para Term Preterm Abortions TAB SAB Ect Mult Living                  Review of Systems  HENT: Positive for congestion and sinus pressure.   Neurological: Positive for headaches.  All other systems reviewed and are negative.    Allergies  Aspirin and Codeine  Home Medications   Current Outpatient Rx  Name  Route  Sig  Dispense  Refill  . b complex vitamins capsule   Oral   Take 2 capsules by mouth daily.         . Calcium Carbonate-Vitamin D (CALCIUM-VITAMIN D) 600-200 MG-UNIT CAPS   Oral   Take 4 capsules by mouth daily.         . cetirizine (ZYRTEC) 10 MG tablet   Oral   Take 10 mg by mouth daily.         . Multiple Vitamins-Minerals (MULTIVITAMIN WITH MINERALS) tablet   Oral   Take 2 tablets by mouth daily.         . NON FORMULARY      Rosemary capsule. 2-4 capsules daily.         . NON FORMULARY      Horsetail capsule.  Take 2-4 daily.         . Omega-3 Fatty Acids (FISH OIL) 600 MG CAPS   Oral   Take 2-4 capsules by mouth daily.  BP 141/104  Pulse 93  Temp(Src) 99.5 F (37.5 C) (Oral)  Resp 18  SpO2 100% Physical Exam  Nursing note and vitals reviewed. Constitutional: She is oriented to person, place, and time. She appears well-developed and well-nourished. No distress.  HENT:  Head: Normocephalic and atraumatic.  Right maxillary sinus tenderness to palpation.   Eyes: Conjunctivae and EOM are normal. Pupils are equal, round, and reactive to light.  Neck: Normal range of motion.  Cardiovascular: Normal rate and regular rhythm.  Exam reveals no gallop and no friction rub.   No murmur heard. Pulmonary/Chest: Effort normal and breath sounds normal. She has no wheezes. She has no rales. She exhibits no tenderness.  Abdominal: Soft. She exhibits no distension. There is no tenderness. There is no rebound and no guarding.  Musculoskeletal: Normal range of motion.  Neurological: She is alert and oriented to person, place, and time. Coordination normal.  Speech is goal-oriented. Moves limbs without  ataxia.   Skin: Skin is warm and dry.  Psychiatric: She has a normal mood and affect. Her behavior is normal.    ED Course   Procedures (including critical care time)  Labs Reviewed - No data to display Ct Head Wo Contrast  05/10/2013   *RADIOLOGY REPORT*  Clinical Data: 1-week history of right temporal headaches associated with dizziness.  CT HEAD WITHOUT CONTRAST  Technique:  Contiguous axial images were obtained from the base of the skull through the vertex without contrast.  Comparison: None.  Findings: Ventricular system normal in size and appearance for age. No mass lesion.  No midline shift.  No acute hemorrhage or hematoma.  No extra-axial fluid collections.  No evidence of acute infarction.  No focal brain parenchymal abnormalities.  No focal osseous abnormalities involving the skull.  Visualized paranasal sinuses, mastoid air cells, and middle ear cavities well- aerated.  IMPRESSION: Normal examination.   Original Report Authenticated By: Hulan Saas, M.D.   1. Sinusitis, acute maxillary     MDM  6:00 PM Patient will have a CT head per request. Vitals stable and patient afebrile. Patient declines any medicine at this time. No meningeal signs noted.   8:12 PM Head CT unremarkable for acute changes. Patient will be treated for migraine with IM toradol and benadryl. Patient will be discharged with Augmentin and Flonase. Patient instructed to follow up with her doctor for further evaluation as needed. Vitals stable and patient afebrile.   Emilia Beck, PA-C 05/10/13 2013

## 2013-05-12 ENCOUNTER — Encounter (HOSPITAL_COMMUNITY): Payer: Self-pay | Admitting: *Deleted

## 2013-05-12 ENCOUNTER — Emergency Department (HOSPITAL_COMMUNITY)
Admission: EM | Admit: 2013-05-12 | Discharge: 2013-05-13 | Disposition: A | Discharge status: Home / Self Care | Payer: BC Managed Care – PPO | Attending: Emergency Medicine | Admitting: Emergency Medicine

## 2013-05-12 DIAGNOSIS — J45909 Unspecified asthma, uncomplicated: Secondary | ICD-10-CM | POA: Insufficient documentation

## 2013-05-12 DIAGNOSIS — G43909 Migraine, unspecified, not intractable, without status migrainosus: Secondary | ICD-10-CM | POA: Insufficient documentation

## 2013-05-12 DIAGNOSIS — Z8619 Personal history of other infectious and parasitic diseases: Secondary | ICD-10-CM | POA: Insufficient documentation

## 2013-05-12 DIAGNOSIS — R519 Headache, unspecified: Secondary | ICD-10-CM

## 2013-05-12 DIAGNOSIS — J3489 Other specified disorders of nose and nasal sinuses: Secondary | ICD-10-CM | POA: Insufficient documentation

## 2013-05-12 DIAGNOSIS — R42 Dizziness and giddiness: Secondary | ICD-10-CM | POA: Insufficient documentation

## 2013-05-12 DIAGNOSIS — Z79899 Other long term (current) drug therapy: Secondary | ICD-10-CM | POA: Insufficient documentation

## 2013-05-12 LAB — CBC
MCHC: 35.4 g/dL (ref 30.0–36.0)
RDW: 13 % (ref 11.5–15.5)

## 2013-05-12 LAB — BASIC METABOLIC PANEL
Calcium: 9.4 mg/dL (ref 8.4–10.5)
GFR calc Af Amer: 81 mL/min — ABNORMAL LOW (ref >90)
GFR calc non Af Amer: 70 mL/min — ABNORMAL LOW (ref >90)
Potassium: 4.1 mEq/L (ref 3.5–5.1)
Sodium: 137 mEq/L (ref 135–145)

## 2013-05-12 NOTE — ED Notes (Signed)
Pt with R parietal headache x 1 week.  She decided to come in today b/c she was worried she was going to have a stroke.  Also c/o R posterior neck pain that increases when she leans forward.  Pt photophobic and nauseated, though denies emesis.  PT also c/o tingling to first 3 R fingers.  No facial droop.  Strength equal bil.

## 2013-05-13 ENCOUNTER — Encounter (HOSPITAL_COMMUNITY): Payer: Self-pay | Admitting: Neurology

## 2013-05-13 ENCOUNTER — Emergency Department (HOSPITAL_COMMUNITY): Payer: BC Managed Care – PPO

## 2013-05-13 DIAGNOSIS — R519 Headache, unspecified: Secondary | ICD-10-CM

## 2013-05-13 DIAGNOSIS — R51 Headache: Secondary | ICD-10-CM

## 2013-05-13 HISTORY — DX: Headache, unspecified: R51.9

## 2013-05-13 LAB — URINALYSIS, ROUTINE W REFLEX MICROSCOPIC
Bilirubin Urine: NEGATIVE
Nitrite: NEGATIVE
Protein, ur: NEGATIVE mg/dL
Specific Gravity, Urine: 1.012 (ref 1.005–1.030)
Urobilinogen, UA: 0.2 mg/dL (ref 0.0–1.0)

## 2013-05-13 LAB — URINE MICROSCOPIC-ADD ON

## 2013-05-13 LAB — SEDIMENTATION RATE: Sed Rate: 38 mm/hr — ABNORMAL HIGH (ref 0–22)

## 2013-05-13 MED ORDER — DIPHENHYDRAMINE HCL 50 MG/ML IJ SOLN
12.5000 mg | Freq: Once | INTRAMUSCULAR | Status: AC
  Administered 2013-05-13: 12.5 mg via INTRAVENOUS
  Filled 2013-05-13: qty 1

## 2013-05-13 MED ORDER — METOCLOPRAMIDE HCL 5 MG/ML IJ SOLN
10.0000 mg | Freq: Once | INTRAMUSCULAR | Status: AC
  Administered 2013-05-13: 10 mg via INTRAVENOUS
  Filled 2013-05-13: qty 2

## 2013-05-13 MED ORDER — METHYLPREDNISOLONE 4 MG PO KIT: PACK | ORAL | Status: DC

## 2013-05-13 MED ORDER — SODIUM CHLORIDE 0.9 % IV BOLUS (SEPSIS)
500.0000 mL | Freq: Once | INTRAVENOUS | Status: AC
  Administered 2013-05-13: 500 mL via INTRAVENOUS

## 2013-05-13 MED ORDER — DEXTROSE 5 % IV SOLN
500.0000 mg | Freq: Once | INTRAVENOUS | Status: AC
  Administered 2013-05-13: 500 mg via INTRAVENOUS
  Filled 2013-05-13: qty 5

## 2013-05-13 MED ORDER — MAGNESIUM SULFATE 40 MG/ML IJ SOLN
2.0000 g | Freq: Once | INTRAMUSCULAR | Status: AC
  Administered 2013-05-13: 2 g via INTRAVENOUS
  Filled 2013-05-13: qty 50

## 2013-05-13 MED ORDER — KETOROLAC TROMETHAMINE 30 MG/ML IJ SOLN
30.0000 mg | Freq: Once | INTRAMUSCULAR | Status: AC
  Administered 2013-05-13: 30 mg via INTRAVENOUS
  Filled 2013-05-13: qty 1

## 2013-05-13 NOTE — ED Provider Notes (Addendum)
CSN: [id number]     Arrival date & time 05/12/13  1805 History   First MD Initiated Contact with Patient 05/13/13 0000     Chief Complaint  Patient presents with  . Headache   (Consider location/radiation/quality/duration/timing/severity/associated sxs/prior Treatment) Patient is a 62 y.o. female presenting with headaches. The history is provided by the patient.  Headache Pain location:  R parietal Quality:  Dull Radiates to:  Does not radiate Onset quality:  Gradual Timing:  Constant Progression:  Unchanged Chronicity:  New Context: not activity, not caffeine and not coughing   Relieved by:  Nothing Worsened by:  Nothing tried Ineffective treatments:  None tried Associated symptoms: dizziness and sinus pressure   Associated symptoms: no abdominal pain, no fever, no loss of balance, no neck pain, no neck stiffness, no seizures and no visual change   Risk factors: no family hx of SAH     Past Medical History  Diagnosis Date  . Asthma     SEASONAL  . History of chicken pox   . Allergy     seasonal  . Positive PPD, treated   . History of shingles    Past Surgical History  Procedure Laterality Date  . Abdominal hysterectomy  1998    Still has cervix and both ovaries  . Meniscus repair  1995    left knee  . Knee arthroscopy  1997    right knee  . Lymph node resection  1999    benign   Family History  Problem Relation Age of Onset  . Hyperlipidemia Mother   . Hypertension Mother   . Diabetes Father   . Tuberculosis Father   . Heart disease Paternal Aunt   . Cancer Paternal Uncle     colon cancer  . Cancer Cousin     breast   History  Substance Use Topics  . Smoking status: Never Smoker   . Smokeless tobacco: Never Used  . Alcohol Use: Yes     Comment: rarely   OB History   Grav Para Term Preterm Abortions TAB SAB Ect Mult Living                 Review of Systems  Constitutional: Negative for fever.  HENT: Positive for sinus pressure. Negative for  facial swelling, neck pain and neck stiffness.   Eyes: Negative for visual disturbance.  Respiratory: Negative for chest tightness and shortness of breath.   Cardiovascular: Negative for chest pain, palpitations and leg swelling.  Gastrointestinal: Negative for abdominal pain.  Neurological: Positive for dizziness and headaches. Negative for seizures, facial asymmetry, speech difficulty and loss of balance.  All other systems reviewed and are negative.    Allergies  Aspirin and Codeine  Home Medications   Current Outpatient Rx  Name  Route  Sig  Dispense  Refill  . amoxicillin-clavulanate (AUGMENTIN) 875-125 MG per tablet   Oral   Take 1 tablet by mouth every 12 (twelve) hours.   14 tablet   0   . b complex vitamins capsule   Oral   Take 2 capsules by mouth daily.         . Calcium Carbonate-Vitamin D (CALCIUM-VITAMIN D) 600-200 MG-UNIT CAPS   Oral   Take 4 capsules by mouth daily.         Marland Kitchen CINNAMON PO   Oral   Take 2 tablets by mouth daily.         . cyclobenzaprine (FLEXERIL) 5 MG tablet   Oral  Take 5 mg by mouth 3 (three) times daily as needed for muscle spasms. For muscle spasms         . fexofenadine (ALLEGRA) 180 MG tablet   Oral   Take 180 mg by mouth daily.         . fluticasone (FLONASE) 50 MCG/ACT nasal spray   Nasal   Place 2 sprays into the nose daily as needed. For allergies         . Hydrocodone-Acetaminophen 5-300 MG TABS   Oral   Take 1 tablet by mouth 2 (two) times daily as needed. For pain         . Multiple Vitamins-Minerals (MULTIVITAMIN WITH MINERALS) tablet   Oral   Take 2 tablets by mouth daily.         . NON FORMULARY   Oral   Take 2-4 capsules by mouth daily. Rosemary capsule         . NON FORMULARY   Oral   Take 2-4 capsules by mouth daily. Horsetail capsule          BP 150/73  Pulse 92  Temp(Src) 99.2 F (37.3 C) (Oral)  Resp 16  SpO2 100% Physical Exam  Constitutional: She is oriented to person,  place, and time. She appears well-developed and well-nourished. No distress.  HENT:  Head: Normocephalic and atraumatic.  Mouth/Throat: Oropharynx is clear and moist.  No temporal bulging nor tenderness  Eyes: Conjunctivae and EOM are normal. Pupils are equal, round, and reactive to light.  Neck: Normal range of motion. Neck supple.  No meningeal signs  Cardiovascular: Normal rate, regular rhythm and intact distal pulses.   Pulmonary/Chest: Effort normal and breath sounds normal. She has no wheezes. She has no rales.  Abdominal: Soft. Bowel sounds are normal. There is no tenderness. There is no rebound and no guarding.  Musculoskeletal: Normal range of motion.  Lymphadenopathy:    She has no cervical adenopathy.  Neurological: She is alert and oriented to person, place, and time. She has normal reflexes. No cranial nerve deficit.  Skin: Skin is warm and dry.  Psychiatric: She has a normal mood and affect.    ED Course  Procedures (including critical care time) Labs Review Labs Reviewed  BASIC METABOLIC PANEL - Abnormal; Notable for the following:    GFR calc non Af Amer 70 (*)    GFR calc Af Amer 81 (*)    All other components within normal limits  CBC  URINALYSIS, ROUTINE W REFLEX MICROSCOPIC   Imaging Review No results found.  MDM  Seen by Dr. Amada Jupiter who feels this is migraine.  Must follow up with neurology as outpatient.  No indication for MRI at this time.  Does not feel the symptoms are consistent with temporal arteritis.  D/c  With medrol dose patient    Argel Pablo K Dina Warbington-Rasch, MD 05/13/13 0304  Kelleigh Skerritt K Barbee Mamula-Rasch, MD 05/13/13 [phone]

## 2013-05-13 NOTE — ED Notes (Addendum)
Pt states that her head started hurting one week ago with no precipitator. Pt states she has had nausea and dizzyness.

## 2013-05-13 NOTE — ED Notes (Signed)
The patient is unable to give an urine specimen at this time. The pt has been advised to use the call light for assistance for the restroom. The RN  Is aware.

## 2013-05-13 NOTE — Consult Note (Addendum)
Neurology Consultation Reason for Consult: Headache Referring Physician: Palubo, A  CC: Headache  History is obtained from: Patient  HPI: April Hunt is a 62 y.o. female who reports a right parietal headache for the past week. She states that as is the first headache of this, she has ever had. Upon further questioning, she reports that she does have a history of headaches that would be bifrontal in nature, and last for up to several days. Her current headache is associated with photophobia, and she has been secluding herself in a dark room. She denies nausea or vomiting. She has not had any fever.  She also complains of bilateral hand tingling, as well as cramps. She has some pain that shoots from her neck down and her right arm, but this is long-standing problem   ROS: A 14 point ROS was performed and is negative except as noted in the HPI.  Past Medical History  Diagnosis Date  . Asthma     SEASONAL  . History of chicken pox   . Allergy     seasonal  . Positive PPD, treated   . History of shingles     Family History: Hyperlipidemia and hypertension in her mother  Social History: Tob: Denies  Exam: Current vital signs: BP 131/64  Pulse 86  Temp(Src) 99.2 F (37.3 C) (Oral)  Resp 16  SpO2 100% Vital signs in last 24 hours: Temp:  [99.2 F (37.3 C)] 99.2 F (37.3 C) (08/25 1853) Pulse Rate:  [86-99] 86 (08/26 0148) Resp:  [16-20] 16 (08/26 0023) BP: (131-150)/(64-82) 131/64 mmHg (08/26 0148) SpO2:  [99 %-100 %] 100 % (08/26 0148)  General: In bed, with the lights off and sunglasses on CV: RRR HEENT: no tenderness over occipital notch Mental Status: Patient is awake, alert, oriented to person, place, month, year, and situation. Immediate and remote memory are intact. Patient is able to give a clear and coherent history. No signs of aphasia or neglect Cranial Nerves: II: Visual Fields are full. Pupils are equal, round, and reactive to light.  Discs are  difficult to visualize. III,IV, VI: EOMI without ptosis or diploplia.  V: Facial sensation is symmetric to temperature VII: Facial movement is symmetric.  VIII: hearing is intact to voice X: Uvula elevates symmetrically XI: Shoulder shrug is symmetric. XII: tongue is midline without atrophy or fasciculations.  Motor: Tone is normal. Bulk is normal. 5/5 strength was present in all four extremities.  Sensory: Sensation is symmetric to light touch and temperature in the arms and legs. Deep Tendon Reflexes: 2+ and symmetric in the biceps and patellae.  Plantars: Toes are downgoing bilaterally.  Cerebellar: FNF and HKS are intact bilaterally Gait: Not assessed due to acute nature of evaluation and multiple medical monitors in ED setting.    I have reviewed labs in epic and the results pertinent to this consultation are: Normal cr  I have reviewed the images obtained:CT head - unremarkable  Impression: 62 year old female with new onset right-sided headache with photophobia. I suspect that her previous headaches were migraine in this current headache represents a severe migraine. Given her age and new onset headache, I would favor checking an ESR to rule out temporal arteritis, though my suspicion for this is relatively low. She does not have any weakness or numbness to suggest that she has an acute radiculopathy.  Recommendations: 1) magnesium, Depakote, Toradol, normal saline bolus. 2) if above does not provide adequate relief, could consider Medrol Dosepak. 3) patient is from [place] and would prefer to have followup there. She can call and make an appointment as an outpatient.   Ritta Slot, MD [facility] [phone]  If 7pm- 7am, please page neurology on call at [phone].    ESR mildly elevated at 38. Given the borderline value, recent diagnosis of sinusitis, and relatively low suspicion for temporal arteritis, I would favor treating for continued  migraine with a medrol dose pack. ESR could be rechecked as an outpatient if needed.   Ritta Slot, MD [facility] [phone]  If 7pm- 7am, please page neurology on call at [phone].

## 2013-07-18 ENCOUNTER — Other Ambulatory Visit: Payer: Self-pay | Admitting: Neurosurgery

## 2013-07-25 ENCOUNTER — Encounter (HOSPITAL_COMMUNITY): Payer: Self-pay | Admitting: Respiratory Therapy

## 2013-07-29 NOTE — Pre-Procedure Instructions (Signed)
April Hunt  07/29/2013   Your procedure is scheduled on:  Monday, November 17th   Report to [facility]  2 * 3 at 7:00 AM.  Call this number if you have problems the morning of surgery: [phone]   Remember:   Do not eat food or drink liquids after midnight Sunday.    Take these medicines the morning of surgery with A SIP OF WATER: flonase, Hydrocodone   Do not wear jewelry, make-up or nail polish.  Do not wear lotions, powders, or perfumes. You may wear deodorant.  Do not shave underarms & legs 48 hours prior to surgery.   Do not bring valuables to the hospital.  [facility] is not responsible for any belongings or valuables.               Contacts, dentures or bridgework may not be worn into surgery.  Leave suitcase in the car. After surgery it may be brought to your room.   For patients admitted to the hospital, discharge time is determined by your treatment team.               Name and phone number of your driver:    Special Instructions: Shower using CHG 2 nights before surgery and the night before surgery.  If you shower the day of surgery use CHG.  Use special wash - you have one bottle of CHG for all showers.  You should use approximately 1/3 of the bottle for each shower.   Please read over the following fact sheets that you were given: Pain Booklet, Blood Transfusion Information, MRSA Information and Surgical Site Infection Prevention

## 2013-07-30 ENCOUNTER — Encounter (HOSPITAL_COMMUNITY): Payer: Self-pay

## 2013-07-30 ENCOUNTER — Encounter (HOSPITAL_COMMUNITY)
Admission: RE | Admit: 2013-07-30 | Discharge: 2013-07-30 | Disposition: A | Discharge status: Home / Self Care | Payer: BC Managed Care – PPO | Source: Ambulatory Visit | Attending: Neurosurgery | Admitting: Neurosurgery

## 2013-07-30 DIAGNOSIS — Z01812 Encounter for preprocedural laboratory examination: Secondary | ICD-10-CM | POA: Insufficient documentation

## 2013-07-30 HISTORY — DX: Gastro-esophageal reflux disease without esophagitis: K21.9

## 2013-07-30 HISTORY — DX: Other specified postprocedural states: R11.2

## 2013-07-30 HISTORY — DX: Unspecified osteoarthritis, unspecified site: M19.90

## 2013-07-30 HISTORY — DX: Other specified postprocedural states: Z98.890

## 2013-07-30 HISTORY — DX: Adverse effect of unspecified anesthetic, initial encounter: T41.45XA

## 2013-07-30 HISTORY — DX: Other complications of anesthesia, initial encounter: T88.59XA

## 2013-07-30 LAB — COMPREHENSIVE METABOLIC PANEL
ALT: 34 U/L (ref 0–35)
AST: 36 U/L (ref 0–37)
Alkaline Phosphatase: 58 U/L (ref 39–117)
CO2: 26 mEq/L (ref 19–32)
Calcium: 9.8 mg/dL (ref 8.4–10.5)
Chloride: 101 mEq/L (ref 96–112)
GFR calc Af Amer: 83 mL/min — ABNORMAL LOW (ref >90)
GFR calc non Af Amer: 72 mL/min — ABNORMAL LOW (ref >90)
Glucose, Bld: 89 mg/dL (ref 70–99)
Potassium: 4.3 mEq/L (ref 3.5–5.1)
Sodium: 139 mEq/L (ref 135–145)
Total Bilirubin: 0.2 mg/dL — ABNORMAL LOW (ref 0.3–1.2)

## 2013-07-30 LAB — CBC
Hemoglobin: 13.8 g/dL (ref 12.0–15.0)
MCH: 32.1 pg (ref 26.0–34.0)
RBC: 4.3 MIL/uL (ref 3.87–5.11)
WBC: 5.6 10*3/uL (ref 4.0–10.5)

## 2013-07-30 LAB — NO BLOOD PRODUCTS

## 2013-07-30 LAB — SURGICAL PCR SCREEN: MRSA, PCR: NEGATIVE

## 2013-07-30 NOTE — Progress Notes (Addendum)
Denies any cardiac issues, no HTN or DM. Patient is Jehovah Witness--refuses blood, but will accept albumin--form faxed to blood bank.   DA

## 2013-08-03 MED ORDER — CEFAZOLIN SODIUM-DEXTROSE 2-3 GM-% IV SOLR
2.0000 g | INTRAVENOUS | Status: AC
  Administered 2013-08-04: 2 g via INTRAVENOUS

## 2013-08-04 ENCOUNTER — Inpatient Hospital Stay (HOSPITAL_COMMUNITY): Payer: BC Managed Care – PPO

## 2013-08-04 ENCOUNTER — Encounter (HOSPITAL_COMMUNITY)
Admission: RE | Disposition: A | Discharge status: Home / Self Care | Payer: BC Managed Care – PPO | Source: Ambulatory Visit | Attending: Neurosurgery

## 2013-08-04 ENCOUNTER — Inpatient Hospital Stay (HOSPITAL_COMMUNITY)
Admission: RE | Admit: 2013-08-04 | Discharge: 2013-08-06 | DRG: 460 | Disposition: A | Discharge status: Home / Self Care | Payer: BC Managed Care – PPO | Source: Ambulatory Visit | Attending: Neurosurgery | Admitting: Neurosurgery

## 2013-08-04 ENCOUNTER — Encounter (HOSPITAL_COMMUNITY): Payer: BC Managed Care – PPO | Admitting: Anesthesiology

## 2013-08-04 ENCOUNTER — Inpatient Hospital Stay (HOSPITAL_COMMUNITY): Payer: BC Managed Care – PPO | Admitting: Anesthesiology

## 2013-08-04 ENCOUNTER — Encounter (HOSPITAL_COMMUNITY): Payer: Self-pay | Admitting: *Deleted

## 2013-08-04 DIAGNOSIS — Z23 Encounter for immunization: Secondary | ICD-10-CM

## 2013-08-04 DIAGNOSIS — M4310 Spondylolisthesis, site unspecified: Secondary | ICD-10-CM | POA: Diagnosis present

## 2013-08-04 DIAGNOSIS — Z888 Allergy status to other drugs, medicaments and biological substances status: Secondary | ICD-10-CM

## 2013-08-04 DIAGNOSIS — Z886 Allergy status to analgesic agent status: Secondary | ICD-10-CM

## 2013-08-04 DIAGNOSIS — G8929 Other chronic pain: Secondary | ICD-10-CM | POA: Diagnosis present

## 2013-08-04 DIAGNOSIS — Z803 Family history of malignant neoplasm of breast: Secondary | ICD-10-CM

## 2013-08-04 DIAGNOSIS — Z800 Family history of malignant neoplasm of digestive organs: Secondary | ICD-10-CM

## 2013-08-04 DIAGNOSIS — J45909 Unspecified asthma, uncomplicated: Secondary | ICD-10-CM | POA: Diagnosis present

## 2013-08-04 DIAGNOSIS — Z79899 Other long term (current) drug therapy: Secondary | ICD-10-CM

## 2013-08-04 DIAGNOSIS — M47817 Spondylosis without myelopathy or radiculopathy, lumbosacral region: Principal | ICD-10-CM | POA: Diagnosis present

## 2013-08-04 DIAGNOSIS — K219 Gastro-esophageal reflux disease without esophagitis: Secondary | ICD-10-CM | POA: Diagnosis present

## 2013-08-04 DIAGNOSIS — Z833 Family history of diabetes mellitus: Secondary | ICD-10-CM

## 2013-08-04 DIAGNOSIS — Z8249 Family history of ischemic heart disease and other diseases of the circulatory system: Secondary | ICD-10-CM

## 2013-08-04 SURGERY — POSTERIOR LUMBAR FUSION 1 LEVEL
Anesthesia: General | Site: Back | Wound class: Clean

## 2013-08-04 MED ORDER — ACETAMINOPHEN 10 MG/ML IV SOLN
1000.0000 mg | Freq: Once | INTRAVENOUS | Status: AC
  Administered 2013-08-04: 1000 mg via INTRAVENOUS

## 2013-08-04 MED ORDER — MORPHINE SULFATE 4 MG/ML IJ SOLN: 4.0000 mg | INTRAMUSCULAR | Status: DC | PRN

## 2013-08-04 MED ORDER — SODIUM CHLORIDE 0.9 % IR SOLN
Status: DC | PRN
  Administered 2013-08-04: 11:00:00

## 2013-08-04 MED ORDER — INFLUENZA VAC SPLIT QUAD 0.5 ML IM SUSP
0.5000 mL | INTRAMUSCULAR | Status: AC
  Administered 2013-08-06: 0.5 mL via INTRAMUSCULAR
  Filled 2013-08-04 (×2): qty 0.5

## 2013-08-04 MED ORDER — LIDOCAINE-EPINEPHRINE 1 %-1:100000 IJ SOLN
INTRAMUSCULAR | Status: DC | PRN
  Administered 2013-08-04: 2 mL via INTRADERMAL

## 2013-08-04 MED ORDER — LACTATED RINGERS IV SOLN
INTRAVENOUS | Status: DC
  Administered 2013-08-04 (×2): via INTRAVENOUS

## 2013-08-04 MED ORDER — FENTANYL CITRATE 0.05 MG/ML IJ SOLN
INTRAMUSCULAR | Status: DC | PRN
  Administered 2013-08-04: 250 ug via INTRAVENOUS
  Administered 2013-08-04: 50 ug via INTRAVENOUS

## 2013-08-04 MED ORDER — HYDROCODONE-ACETAMINOPHEN 5-325 MG PO TABS
1.0000 | ORAL_TABLET | ORAL | Status: DC | PRN
  Administered 2013-08-04 – 2013-08-06 (×6): 2 via ORAL
  Filled 2013-08-04 (×6): qty 2

## 2013-08-04 MED ORDER — ONDANSETRON HCL 4 MG/2ML IJ SOLN
4.0000 mg | Freq: Four times a day (QID) | INTRAMUSCULAR | Status: DC | PRN
  Administered 2013-08-05 – 2013-08-06 (×3): 4 mg via INTRAVENOUS
  Filled 2013-08-04 (×3): qty 2

## 2013-08-04 MED ORDER — SODIUM CHLORIDE 0.9 % IJ SOLN: 3.0000 mL | INTRAMUSCULAR | Status: DC | PRN

## 2013-08-04 MED ORDER — HYDROMORPHONE HCL PF 1 MG/ML IJ SOLN
INTRAMUSCULAR | Status: AC
  Filled 2013-08-04: qty 1

## 2013-08-04 MED ORDER — HYDROMORPHONE HCL PF 1 MG/ML IJ SOLN
0.2500 mg | INTRAMUSCULAR | Status: DC | PRN
  Administered 2013-08-04 (×2): 0.5 mg via INTRAVENOUS

## 2013-08-04 MED ORDER — ACETAMINOPHEN 325 MG PO TABS: 650.0000 mg | ORAL_TABLET | ORAL | Status: DC | PRN

## 2013-08-04 MED ORDER — LIDOCAINE HCL (CARDIAC) 20 MG/ML IV SOLN
INTRAVENOUS | Status: DC | PRN
  Administered 2013-08-04: 40 mg via INTRAVENOUS

## 2013-08-04 MED ORDER — SUCCINYLCHOLINE CHLORIDE 20 MG/ML IJ SOLN
INTRAMUSCULAR | Status: DC | PRN
  Administered 2013-08-04: 100 mg via INTRAVENOUS

## 2013-08-04 MED ORDER — MENTHOL 3 MG MT LOZG: 1.0000 | LOZENGE | OROMUCOSAL | Status: DC | PRN

## 2013-08-04 MED ORDER — SCOPOLAMINE 1 MG/3DAYS TD PT72
MEDICATED_PATCH | TRANSDERMAL | Status: AC
  Filled 2013-08-04: qty 1

## 2013-08-04 MED ORDER — SODIUM CHLORIDE 0.9 % IV SOLN
10.0000 mg | INTRAVENOUS | Status: DC | PRN
  Administered 2013-08-04: 20 ug/min via INTRAVENOUS

## 2013-08-04 MED ORDER — MEPERIDINE HCL 25 MG/ML IJ SOLN: 6.2500 mg | INTRAMUSCULAR | Status: DC | PRN

## 2013-08-04 MED ORDER — ALUM & MAG HYDROXIDE-SIMETH 200-200-20 MG/5ML PO SUSP
30.0000 mL | Freq: Four times a day (QID) | ORAL | Status: DC | PRN
  Administered 2013-08-06: 30 mL via ORAL
  Filled 2013-08-04: qty 30

## 2013-08-04 MED ORDER — ACETAMINOPHEN 10 MG/ML IV SOLN
INTRAVENOUS | Status: AC
  Filled 2013-08-04: qty 100

## 2013-08-04 MED ORDER — ROCURONIUM BROMIDE 100 MG/10ML IV SOLN
INTRAVENOUS | Status: DC | PRN
  Administered 2013-08-04: 50 mg via INTRAVENOUS

## 2013-08-04 MED ORDER — MAGNESIUM HYDROXIDE 400 MG/5ML PO SUSP: 30.0000 mL | Freq: Every day | ORAL | Status: DC | PRN

## 2013-08-04 MED ORDER — LORATADINE 10 MG PO TABS
10.0000 mg | ORAL_TABLET | Freq: Every day | ORAL | Status: DC
  Administered 2013-08-05 – 2013-08-06 (×2): 10 mg via ORAL
  Filled 2013-08-04 (×3): qty 1

## 2013-08-04 MED ORDER — BISACODYL 10 MG RE SUPP
10.0000 mg | Freq: Every day | RECTAL | Status: DC | PRN
Start: 2013-08-04 — End: 2013-08-06

## 2013-08-04 MED ORDER — KETOROLAC TROMETHAMINE 30 MG/ML IJ SOLN
30.0000 mg | Freq: Four times a day (QID) | INTRAMUSCULAR | Status: DC
Start: 2013-08-04 — End: 2013-08-06
  Administered 2013-08-04 – 2013-08-06 (×7): 30 mg via INTRAVENOUS
  Filled 2013-08-04 (×10): qty 1

## 2013-08-04 MED ORDER — SCOPOLAMINE 1 MG/3DAYS TD PT72
MEDICATED_PATCH | TRANSDERMAL | Status: AC
  Administered 2013-08-04: 1 via TRANSDERMAL
  Filled 2013-08-04: qty 1

## 2013-08-04 MED ORDER — CYCLOBENZAPRINE HCL 10 MG PO TABS
10.0000 mg | ORAL_TABLET | Freq: Three times a day (TID) | ORAL | Status: DC | PRN
  Administered 2013-08-06: 10 mg via ORAL
  Filled 2013-08-04: qty 1

## 2013-08-04 MED ORDER — OXYCODONE HCL 5 MG/5ML PO SOLN: 5.0000 mg | Freq: Once | ORAL | Status: DC | PRN

## 2013-08-04 MED ORDER — ZOLPIDEM TARTRATE 5 MG PO TABS: 5.0000 mg | ORAL_TABLET | Freq: Every evening | ORAL | Status: DC | PRN

## 2013-08-04 MED ORDER — OXYCODONE-ACETAMINOPHEN 5-325 MG PO TABS
1.0000 | ORAL_TABLET | ORAL | Status: DC | PRN
  Administered 2013-08-04: 2 via ORAL
  Filled 2013-08-04 (×2): qty 2

## 2013-08-04 MED ORDER — PHENOL 1.4 % MT LIQD: 1.0000 | OROMUCOSAL | Status: DC | PRN

## 2013-08-04 MED ORDER — GLYCOPYRROLATE 0.2 MG/ML IJ SOLN
INTRAMUSCULAR | Status: DC | PRN
  Administered 2013-08-04: 0.4 mg via INTRAVENOUS

## 2013-08-04 MED ORDER — FLUTICASONE PROPIONATE 50 MCG/ACT NA SUSP: 2.0000 | Freq: Every day | NASAL | Status: DC | PRN

## 2013-08-04 MED ORDER — PHENYLEPHRINE HCL 10 MG/ML IJ SOLN
INTRAMUSCULAR | Status: DC | PRN
  Administered 2013-08-04 (×3): 80 ug via INTRAVENOUS
  Administered 2013-08-04 (×2): 40 ug via INTRAVENOUS

## 2013-08-04 MED ORDER — KETOROLAC TROMETHAMINE 30 MG/ML IJ SOLN
30.0000 mg | Freq: Once | INTRAMUSCULAR | Status: AC
  Administered 2013-08-04: 30 mg via INTRAVENOUS

## 2013-08-04 MED ORDER — SCOPOLAMINE 1 MG/3DAYS TD PT72: 1.0000 | MEDICATED_PATCH | TRANSDERMAL | Status: DC

## 2013-08-04 MED ORDER — SODIUM CHLORIDE 0.9 % IV SOLN: 250.0000 mL | INTRAVENOUS | Status: DC

## 2013-08-04 MED ORDER — HYDROXYZINE HCL 50 MG PO TABS
50.0000 mg | ORAL_TABLET | ORAL | Status: DC | PRN
  Filled 2013-08-04: qty 1

## 2013-08-04 MED ORDER — THROMBIN 20000 UNITS EX SOLR
CUTANEOUS | Status: DC | PRN
  Administered 2013-08-04: 11:00:00 via TOPICAL

## 2013-08-04 MED ORDER — DEXAMETHASONE SODIUM PHOSPHATE 4 MG/ML IJ SOLN
INTRAMUSCULAR | Status: DC | PRN
  Administered 2013-08-04: 8 mg via INTRAVENOUS

## 2013-08-04 MED ORDER — KETOROLAC TROMETHAMINE 30 MG/ML IJ SOLN
INTRAMUSCULAR | Status: AC
  Filled 2013-08-04: qty 1

## 2013-08-04 MED ORDER — ACETAMINOPHEN 650 MG RE SUPP: 650.0000 mg | RECTAL | Status: DC | PRN

## 2013-08-04 MED ORDER — PROMETHAZINE HCL 25 MG/ML IJ SOLN: 6.2500 mg | INTRAMUSCULAR | Status: DC | PRN

## 2013-08-04 MED ORDER — 0.9 % SODIUM CHLORIDE (POUR BTL) OPTIME
TOPICAL | Status: DC | PRN
  Administered 2013-08-04: 1000 mL

## 2013-08-04 MED ORDER — CEFAZOLIN SODIUM-DEXTROSE 2-3 GM-% IV SOLR
INTRAVENOUS | Status: AC
  Filled 2013-08-04: qty 50

## 2013-08-04 MED ORDER — ONDANSETRON HCL 4 MG/2ML IJ SOLN
INTRAMUSCULAR | Status: DC | PRN
  Administered 2013-08-04: 4 mg via INTRAVENOUS

## 2013-08-04 MED ORDER — MIDAZOLAM HCL 5 MG/5ML IJ SOLN
INTRAMUSCULAR | Status: DC | PRN
  Administered 2013-08-04: 1 mg via INTRAVENOUS

## 2013-08-04 MED ORDER — SODIUM CHLORIDE 0.9 % IJ SOLN
3.0000 mL | Freq: Two times a day (BID) | INTRAMUSCULAR | Status: DC
  Administered 2013-08-04 – 2013-08-06 (×4): 3 mL via INTRAVENOUS

## 2013-08-04 MED ORDER — KCL IN DEXTROSE-NACL 20-5-0.45 MEQ/L-%-% IV SOLN
INTRAVENOUS | Status: DC
  Filled 2013-08-04 (×6): qty 1000

## 2013-08-04 MED ORDER — OXYCODONE HCL 5 MG PO TABS: 5.0000 mg | ORAL_TABLET | Freq: Once | ORAL | Status: DC | PRN

## 2013-08-04 MED ORDER — BUPIVACAINE HCL (PF) 0.5 % IJ SOLN
INTRAMUSCULAR | Status: DC | PRN
  Administered 2013-08-04: 2 mL

## 2013-08-04 MED ORDER — NEOSTIGMINE METHYLSULFATE 1 MG/ML IJ SOLN
INTRAMUSCULAR | Status: DC | PRN
  Administered 2013-08-04: 3 mg via INTRAVENOUS

## 2013-08-04 MED ORDER — PROPOFOL 10 MG/ML IV BOLUS
INTRAVENOUS | Status: DC | PRN
  Administered 2013-08-04: 150 mg via INTRAVENOUS

## 2013-08-04 SURGICAL SUPPLY — 70 items
BAG DECANTER FOR FLEXI CONT (MISCELLANEOUS) ×2 IMPLANT
BLADE SURG ROTATE 9660 (MISCELLANEOUS) IMPLANT
BRUSH SCRUB EZ PLAIN DRY (MISCELLANEOUS) ×2 IMPLANT
BUR ACRON 5.0MM COATED (BURR) ×2 IMPLANT
BUR MATCHSTICK NEURO 3.0 LAGG (BURR) ×2 IMPLANT
CANISTER SUCT 3000ML (MISCELLANEOUS) ×2 IMPLANT
CAP LCK SPNE (Orthopedic Implant) ×4 IMPLANT
CAP LOCK SPINE RADIUS (Orthopedic Implant) ×4 IMPLANT
CAP LOCKING (Orthopedic Implant) ×4 IMPLANT
CONT SPEC 4OZ CLIKSEAL STRL BL (MISCELLANEOUS) ×2 IMPLANT
COVER BACK TABLE 24X17X13 BIG (DRAPES) IMPLANT
COVER TABLE BACK 60X90 (DRAPES) ×2 IMPLANT
DERMABOND ADVANCED (GAUZE/BANDAGES/DRESSINGS) ×2
DERMABOND ADVANCED .7 DNX12 (GAUZE/BANDAGES/DRESSINGS) ×2 IMPLANT
DRAPE C-ARM 42X72 X-RAY (DRAPES) ×4 IMPLANT
DRAPE LAPAROTOMY 100X72X124 (DRAPES) ×2 IMPLANT
DRAPE POUCH INSTRU U-SHP 10X18 (DRAPES) ×2 IMPLANT
DRAPE PROXIMA HALF (DRAPES) ×2 IMPLANT
DRSG EMULSION OIL 3X3 NADH (GAUZE/BANDAGES/DRESSINGS) IMPLANT
ELECT REM PT RETURN 9FT ADLT (ELECTROSURGICAL) ×2
ELECTRODE REM PT RTRN 9FT ADLT (ELECTROSURGICAL) ×1 IMPLANT
GAUZE SPONGE 4X4 16PLY XRAY LF (GAUZE/BANDAGES/DRESSINGS) IMPLANT
GLOVE BIOGEL PI IND STRL 8 (GLOVE) ×2 IMPLANT
GLOVE BIOGEL PI IND STRL 8.5 (GLOVE) ×1 IMPLANT
GLOVE BIOGEL PI INDICATOR 8 (GLOVE) ×2
GLOVE BIOGEL PI INDICATOR 8.5 (GLOVE) ×1
GLOVE ECLIPSE 7.5 STRL STRAW (GLOVE) ×8 IMPLANT
GLOVE ECLIPSE 8.0 STRL XLNG CF (GLOVE) ×2 IMPLANT
GLOVE EXAM NITRILE LRG STRL (GLOVE) IMPLANT
GLOVE EXAM NITRILE MD LF STRL (GLOVE) IMPLANT
GLOVE EXAM NITRILE XL STR (GLOVE) IMPLANT
GLOVE EXAM NITRILE XS STR PU (GLOVE) IMPLANT
GOWN BRE IMP SLV AUR LG STRL (GOWN DISPOSABLE) ×2 IMPLANT
GOWN BRE IMP SLV AUR XL STRL (GOWN DISPOSABLE) ×8 IMPLANT
GOWN STRL REIN 2XL LVL4 (GOWN DISPOSABLE) ×2 IMPLANT
KIT BASIN OR (CUSTOM PROCEDURE TRAY) ×2 IMPLANT
KIT INFUSE X SMALL 1.4CC (Orthopedic Implant) ×2 IMPLANT
KIT ROOM TURNOVER OR (KITS) ×2 IMPLANT
MILL MEDIUM DISP (BLADE) ×2 IMPLANT
NEEDLE BONE MARROW 8GAX6 (NEEDLE) ×2 IMPLANT
NEEDLE SPNL 18GX3.5 QUINCKE PK (NEEDLE) ×2 IMPLANT
NEEDLE SPNL 22GX3.5 QUINCKE BK (NEEDLE) ×2 IMPLANT
NS IRRIG 1000ML POUR BTL (IV SOLUTION) ×2 IMPLANT
PACK LAMINECTOMY NEURO (CUSTOM PROCEDURE TRAY) ×2 IMPLANT
PAD ARMBOARD 7.5X6 YLW CONV (MISCELLANEOUS) ×6 IMPLANT
PATTIES SURGICAL .5 X.5 (GAUZE/BANDAGES/DRESSINGS) ×2 IMPLANT
PATTIES SURGICAL .5 X1 (DISPOSABLE) IMPLANT
PATTIES SURGICAL 1X1 (DISPOSABLE) IMPLANT
PEEK PLIF AVS 11X25X4 (Peek) ×4 IMPLANT
ROD RADIUS 40MM (Neuro Prosthesis/Implant) ×1 IMPLANT
ROD RADIUS 45MM (Rod) ×1 IMPLANT
ROD SPNL 40X5.5XNS TI RDS (Neuro Prosthesis/Implant) ×1 IMPLANT
ROD SPNL 45X5.5XNS TI RDS (Rod) ×1 IMPLANT
SCREW 5.75X40M (Screw) ×8 IMPLANT
SPONGE GAUZE 4X4 12PLY (GAUZE/BANDAGES/DRESSINGS) ×2 IMPLANT
SPONGE LAP 4X18 X RAY DECT (DISPOSABLE) IMPLANT
SPONGE NEURO XRAY DETECT 1X3 (DISPOSABLE) IMPLANT
SPONGE SURGIFOAM ABS GEL 100 (HEMOSTASIS) ×2 IMPLANT
STRIP BIOACTIVE VITOSS 25X100X (Neuro Prosthesis/Implant) ×4 IMPLANT
SUT VIC AB 1 CT1 18XBRD ANBCTR (SUTURE) ×2 IMPLANT
SUT VIC AB 1 CT1 8-18 (SUTURE) ×2
SUT VIC AB 2-0 CP2 18 (SUTURE) ×4 IMPLANT
SYR 20ML ECCENTRIC (SYRINGE) ×2 IMPLANT
SYR 3ML LL SCALE MARK (SYRINGE) ×8 IMPLANT
SYR CONTROL 10ML LL (SYRINGE) ×2 IMPLANT
TAPE CLOTH SURG 4X10 WHT LF (GAUZE/BANDAGES/DRESSINGS) ×2 IMPLANT
TOWEL OR 17X24 6PK STRL BLUE (TOWEL DISPOSABLE) ×2 IMPLANT
TOWEL OR 17X26 10 PK STRL BLUE (TOWEL DISPOSABLE) ×2 IMPLANT
TRAY FOLEY CATH 14FRSI W/METER (CATHETERS) ×2 IMPLANT
WATER STERILE IRR 1000ML POUR (IV SOLUTION) ×2 IMPLANT

## 2013-08-04 NOTE — Anesthesia Preprocedure Evaluation (Addendum)
Anesthesia Evaluation  Patient identified by MRN, date of birth, ID band Patient awake    Reviewed: Allergy & Precautions, H&P , NPO status , Patient's Chart, lab work & pertinent test results  History of Anesthesia Complications (+) PONV and history of anesthetic complications  Airway Mallampati: II TM Distance: >3 FB Neck ROM: Full    Dental  (+) Teeth Intact and Dental Advisory Given   Pulmonary asthma ,  breath sounds clear to auscultation  Pulmonary exam normal       Cardiovascular negative cardio ROS  Rhythm:Regular Rate:Normal     Neuro/Psych  Headaches, Chronic back pain: narcotics    GI/Hepatic Neg liver ROS, GERD-  Poorly Controlled,  Endo/Other  negative endocrine ROS  Renal/GU negative Renal ROS     Musculoskeletal   Abdominal   Peds  Hematology  (+) REFUSES BLOOD PRODUCTS, JEHOVAH'S WITNESS  Anesthesia Other Findings   Reproductive/Obstetrics                         Anesthesia Physical Anesthesia Plan  ASA: II  Anesthesia Plan: General   Post-op Pain Management:    Induction: Intravenous  Airway Management Planned: Oral ETT  Additional Equipment:   Intra-op Plan:   Post-operative Plan: Extubation in OR  Informed Consent: I have reviewed the patients History and Physical, chart, labs and discussed the procedure including the risks, benefits and alternatives for the proposed anesthesia with the patient or authorized representative who has indicated his/her understanding and acceptance.   Dental advisory given  Plan Discussed with: CRNA, Anesthesiologist and Surgeon  Anesthesia Plan Comments: (Plan routine monitors, GETA)       Anesthesia Quick Evaluation

## 2013-08-04 NOTE — Transfer of Care (Signed)
Immediate Anesthesia Transfer of Care Note  Patient: April Hunt  Procedure(s) Performed: Procedure(s) with comments: Lumbar Four-Five Lumbar decompression/posterior lumbar interbody fusion/posterolateral arthrodesis (N/A) - Lumbar Four-Five Lumbar decompression/posterior lumbar interbody fusion/posterolateral arthrodesis  Patient Location: PACU  Anesthesia Type:General  Level of Consciousness: awake, alert  and oriented  Airway & Oxygen Therapy: Patient Spontanous Breathing and Patient connected to nasal cannula oxygen  Post-op Assessment: Report given to PACU RN, Post -op Vital signs reviewed and stable and Patient moving all extremities X 4  Post vital signs: Reviewed and stable  Complications: No apparent anesthesia complications

## 2013-08-04 NOTE — Preoperative (Signed)
Beta Blockers   Reason not to administer Beta Blockers:Not Applicable 

## 2013-08-04 NOTE — Op Note (Signed)
08/04/2013  1:44 PM  PATIENT:  April Hunt  62 y.o. female  PRE-OPERATIVE DIAGNOSIS:  L4-5 Lumbar stenosis, Lumbar spondylosis, L4-5 dynamic grade 1-2 degenerative Spondylolisthesis, Lumbar radiculopathy  POST-OPERATIVE DIAGNOSIS:  Lumbar stenosis, Lumbar spondylosis, L4-5 dynamic grade 1-2 degenerative Spondylolisthesis, Lumbar radiculopathy  PROCEDURE:  Procedure(s): Lumbar Four-Five Lumbar decompression/posterior lumbar interbody fusion/posterolateral arthrodesis: Bilateral L4-5 lumbar laminectomy, facetectomy, foraminotomy, and decompression of the canal stenosis and stenotic neural foraminal compression of the exiting L4 and L5 nerve roots, with decompression beyond that required for interbody arthrodesis; bilateral L4-5 posterior lumbar interbody arthrodesis with AVS peek interbody implants, Vitoss BA with bone marrow aspirate, and infuse; bilateral L4-5 posterior lateral arthrodesis with radius nonsegmental posterior instrumentation, Vitoss BA with bone marrow aspirate, and infuse  SURGEON:  Surgeon(s): Hewitt Shorts, MD Barnett Abu, MD  ASSISTANTS: Barnett Abu, M.D.  ANESTHESIA:   general  EBL:  Total I/O In: 1000 [I.V.:1000] Out: 540 [Urine:340; Blood:200]  BLOOD ADMINISTERED:none  CELL SAVER GIVEN: The Cell Saver technician felt that there was insufficient blood loss to process the collected blood  COUNT: Correct per nursing staff  DICTATION: Patient is brought to the operating room placed under general endotracheal anesthesia. The patient was turned to prone position the lumbar region was prepped with Betadine soap and solution and draped in a sterile fashion. The midline was infiltrated with local anesthesia with epinephrine. A localizing x-ray was taken and then a midline incision was made carried down through the subcutaneous tissue, bipolar cautery and electrocautery were used to maintain hemostasis. Dissection was carried down to the lumbar fascia. The fascia  was incised bilaterally and the paraspinal muscles were dissected with a spinous process and lamina in a subperiosteal fashion. Another x-ray was taken for localization and the L4-5 level was localized. Dissection was then carried out laterally over the facet complex and the transverse processes of L4 and L5 were exposed and decorticated. We began the decompression using the high-speed drill and Kerrison punches to perform it bilateral L4-5 decompressive lumbar laminectomy. The thickened ligamentum flavum was carefully removed, decompressing the stenotic compression of the thecal sac. Dissection was carried out laterally including facetectomy and foraminotomies with decompression of the stenotic compression of the L4 and L5 nerve roots. Once the decompression stenotic compression of the thecal sac and exiting nerve roots was completed we proceeded with the posterior lumbar interbody arthrodesis. The annulus was incised bilaterally and the disc space entered. A thorough discectomy was performed using pituitary rongeurs and curettes. Once the discectomy was completed we began to prepare the endplate surfaces removing the cartilaginous endplates surface. We then measured the height of the intervertebral disc space. We selected 11 x 25 x 4 AVS peek interbody implants.  The C-arm fluoroscope was then draped and brought in the field and we identified the pedicle entry points bilaterally at the L4-L5 levels. Each of the 4 pedicles was probed, we aspirated bone marrow aspirate from the vertebral bodies, this was injected over 2 10 cc strips of Vitoss BA. Then each of the pedicles was examined with the ball probe good bony surfaces were found and no bony cuts were found. Each of the pedicles was then tapped with a 5.25 mm tap, again examined with the ball probe good threading was found and no bony cuts were found. We then placed 5.75 by 40 millimeter screws bilaterally at each level.  We then packed the AVS peek  interbody implants with Vitoss BA with bone marrow aspirate and infuse, and then placed  the first implant and on the right side, carefully retracting the thecal sac and nerve root medially. We then went back to the left side and packed the midline with additional Vitoss BA with bone marrow aspirate and infuse, and then placed a second implant and on the left side again retracting the thecal sac and nerve root medially. Additional Vitoss BA with bone marrow aspirate was packed lateral to the implants.  We then packed the lateral gutter over the transverse processes and intertransverse space with Vitoss BA with bone marrow aspirate and infuse. We then selected pre-lordosed rods, using a 45 mm rod on the left side and a 40 mm rod on the right side.  They were placed within the screw heads and secured with locking caps, once all 4 locking caps were placed final tightening was performed against a counter torque.  The wound had been irrigated multiple times during the procedure with saline solution and bacitracin solution, good hemostasis was established with a combination of bipolar cautery and Gelfoam with thrombin. Once good hemostasis was confirmed we proceeded with closure paraspinal muscles deep fascia and Scarpa's fascia were closed with interrupted undyed 1 Vicryl sutures the subcutaneous and subcuticular closed with interrupted inverted 2-0 undyed Vicryl sutures the skin edges were approximated with Dermabond. The wounds dressed with sterile gauze and Hypafix.  Following surgery the patient was turned back to the supine position to be reversed and the anesthetic extubated and transferred to the recovery room for further care.   PLAN OF CARE: Admit to inpatient   PATIENT DISPOSITION:  PACU - hemodynamically stable.   Delay start of Pharmacological VTE agent (>24hrs) due to surgical blood loss or risk of bleeding:  yes

## 2013-08-04 NOTE — H&P (Signed)
Subjective: Patient is a 62 y.o. female who is admitted for treatment of low back and left lumbar radicular pain, associated with radicular numbness. 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 Patient is admitted now for a bilateral L4-5 lumbar decompression including laminectomy, facetectomy, and foraminotomy, and a bilateral L4-5 posterior lumbar interbody arthrodesis with interbody implants and bone graft, and a bilateral L4-5 posterior lateral arthrodesis with posterior instrumentation and bone graft. Patient's been treated with spinal injections, NSAIDS, and physical therapy without relief. She is admitted now for surgery.  Patient Active Problem List   Diagnosis Date Noted  . Headache 05/13/2013  . Sinusitis 03/04/2013  . Back pain 07/21/2012  . Routine general medical examination at a health care facility 03/19/2012  . Joint pain 03/19/2012  . Tinea versicolor 03/19/2012  . Tinea pedis 03/19/2012  . Otitis media 03/19/2012  . Neck pain 12/25/2011  . Asthma 12/25/2011  . 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 Elevated blood pressure reading without diagnosis of hypertension 12/25/2011   Past Medical History  Diagnosis Date  . Asthma     SEASONAL  . History of chicken pox   . Allergy     seasonal  . Positive PPD, treated   . History of shingles   . Complication of anesthesia     hard to wake up  . PONV (postoperative nausea and vomiting)   . GERD (gastroesophageal reflux disease)     OTC  . Arthritis     Past Surgical History  Procedure Laterality Date  . Abdominal hysterectomy  1998    Still has cervix and both ovaries  . Meniscus repair  1995    left knee  . Knee arthroscopy  1997    right knee  . Lymph node resection  1999    benign    Prescriptions prior to admission  Medication Sig Dispense Refill  . b complex vitamins capsule Take 2 capsules by mouth daily.       Marland Kitchen BIOTIN PO Take 1 tablet by mouth daily.      . Calcium Carbonate-Vitamin D (CALCIUM-VITAMIN D) 600-200 MG-UNIT CAPS Take 4 capsules by mouth daily.      Marland Kitchen CINNAMON PO Take 2 tablets by mouth daily.      . cyclobenzaprine (FLEXERIL) 5 MG tablet Take 5 mg by mouth 3 (three) times daily as needed for muscle spasms. For muscle spasms      . fexofenadine (ALLEGRA) 180 MG tablet Take 180 mg by mouth daily.      . fluticasone (FLONASE) 50 MCG/ACT nasal spray Place 2 sprays into the nose daily as needed. For allergies      . Hydrocodone-Acetaminophen 5-300 MG TABS Take 1 tablet by mouth 2 (two) times daily as needed. For pain      . ibuprofen (ADVIL,MOTRIN) 600 MG tablet Take 600 mg by mouth every 6 (six) hours as needed.      . L-LYSINE PO Take 1 tablet by mouth daily.      . Multiple Vitamins-Minerals (MULTIVITAMIN WITH MINERALS) tablet Take 2 tablets by mouth daily.      . nabumetone (RELAFEN) 500 MG tablet Take 500 mg by mouth 2 (two) times daily.      . NON FORMULARY Take 2-4 capsules by mouth daily. Rosemary capsule      . NON FORMULARY Take 2-4 capsules by mouth daily. Horsetail capsule  Allergies  Allergen Reactions  . Aspirin Nausea And Vomiting  . Codeine Other (See Comments)    Jittery  . Other     NO BLOOD OR BLOOD PRODUCTS    History  Substance Use Topics  . Smoking status: Never Smoker   . Smokeless tobacco: Never Used  . Alcohol Use: Yes     Comment: rarely    Family History  Problem Relation Age of Onset  . Hyperlipidemia Mother   . Hypertension Mother   . Diabetes Father   . Tuberculosis Father   . Heart disease Paternal Aunt   . Cancer Paternal Uncle     colon cancer  . Cancer Cousin     breast     Review of Systems A comprehensive review of systems was negative.  Objective: Vital signs in last 24 hours: Temp:  [97.8 F (36.6 C)] 97.8 F (36.6 C) (11/17 0712) Pulse Rate:  [84] 84 (11/17 0712) Resp:  [18] 18 (11/17 0712) BP: (148)/(82) 148/82  mmHg (11/17 0712) SpO2:  [100 %] 100 % (11/17 0712)  EXAM: Patient is a well-developed well-nourished female in no acute distress. Lungs are clear to auscultation , the patient has symmetrical respiratory excursion. Heart has a regular rate and rhythm normal S1 and S2 no murmur.   Abdomen is soft nontender nondistended bowel sounds are present. Extremity examination shows no clubbing cyanosis or edema. Musculoskeletal examination shows tenderness to palpation in the lower left paralumbar region, not over the lumbar spinous processes or over the right paralumbar region. Mobility is significantly limited due to pain. Flexion is limited about 20-30, extension is limited at about 5-10. She has a positive straight leg raising on the left, at about 30; she has a negative straight leg raising on the right. Motor examination shows 5 over 5 strength in the lower extremities including the iliopsoas quadriceps dorsiflexor extensor hallicus  longus and plantar flexor bilaterally. Sensation is decreased to pinprick in the lateral aspect of the left leg, most so than the lateral aspect of the left foot; sensation is intact to pinprick in the distal right extremity. Reflexes are symmetrical bilaterally. No pathologic reflexes are present. Patient uses a quad cane to give her support because of the severity of her pain.  Data Review:CBC    Component Value Date/Time   WBC 5.6 07/30/2013 1317   RBC 4.30 07/30/2013 1317   HGB 13.8 07/30/2013 1317   HCT 39.2 07/30/2013 1317   PLT 240 07/30/2013 1317   MCV 91.2 07/30/2013 1317   MCH 32.1 07/30/2013 1317   MCHC 35.2 07/30/2013 1317   RDW 12.2 07/30/2013 1317   LYMPHSABS 1.7 03/19/2012 1132   MONOABS 0.3 03/19/2012 1132   EOSABS 0.2 03/19/2012 1132   BASOSABS 0.0 03/19/2012 1132                          BMET    Component Value Date/Time   NA 139 07/30/2013 1317   K 4.3 07/30/2013 1317   CL 101 07/30/2013 1317   CO2 26 07/30/2013 1317   GLUCOSE 89 07/30/2013 1317    BUN 13 07/30/2013 1317   CREATININE 0.86 07/30/2013 1317   CREATININE 0.97 03/19/2012 1132   CALCIUM 9.8 07/30/2013 1317   GFRNONAA 72* 07/30/2013 1317   GFRAA 83* 07/30/2013 1317     Assessment/Plan: Patient with disabling low back and left lumbar radicular pain secondary to a dynamic degenerative spinal stenosis secondary to facet arthropathy. Patient  is admitted for lumbar decompression and arthrodesis.  I've discussed with the patient the nature of his condition, the nature the surgical procedure, the typical length of surgery, hospital stay, and overall recuperation, the limitations postoperatively, and risks of surgery. I discussed risks including risks of infection, bleeding, possibly need for transfusion, the risk of nerve root dysfunction with pain, weakness, numbness, or paresthesias, the risk of dural tear and CSF leakage and possible need for further surgery, the risk of failure of the arthrodesis and possibly for further surgery, the risk of anesthetic complications including myocardial infarction, stroke, pneumonia, and death. We discussed the need for postoperative immobilization in a lumbar brace. Understanding all this the patient does wish to proceed with surgery and is admitted for such.     Hewitt Shorts, MD 08/04/2013 8:43 AM

## 2013-08-04 NOTE — Anesthesia Procedure Notes (Signed)
Procedure Name: Intubation Date/Time: 08/04/2013 10:18 AM Performed by: Sharlene Dory E Pre-anesthesia Checklist: Patient identified, Emergency Drugs available, Suction available, Patient being monitored and Timeout performed Patient Re-evaluated:Patient Re-evaluated prior to inductionOxygen Delivery Method: Circle system utilized Preoxygenation: Pre-oxygenation with 100% oxygen Intubation Type: IV induction Ventilation: Mask ventilation without difficulty Laryngoscope Size: Mac and 3 Grade View: Grade III Tube type: Oral Tube size: 7.0 mm Number of attempts: 1 Airway Equipment and Method: Stylet Placement Confirmation: positive ETCO2 and breath sounds checked- equal and bilateral Secured at: 21 cm Tube secured with: Tape Dental Injury: Teeth and Oropharynx as per pre-operative assessment

## 2013-08-04 NOTE — Anesthesia Postprocedure Evaluation (Signed)
  Anesthesia Post-op Note  Patient: April Hunt  Procedure(s) Performed: Procedure(s) with comments: Lumbar Four-Five Lumbar decompression/posterior lumbar interbody fusion/posterolateral arthrodesis (N/A) - Lumbar Four-Five Lumbar decompression/posterior lumbar interbody fusion/posterolateral arthrodesis  Patient Location: PACU  Anesthesia Type:General  Level of Consciousness: awake, alert , oriented and patient cooperative  Airway and Oxygen Therapy: Patient Spontanous Breathing and Patient connected to nasal cannula oxygen  Post-op Pain: mild  Post-op Assessment: Post-op Vital signs reviewed, Patient's Cardiovascular Status Stable, Respiratory Function Stable, Patent Airway, No signs of Nausea or vomiting and Pain level controlled  Post-op Vital Signs: Reviewed and stable  Complications: No apparent anesthesia complications

## 2013-08-04 NOTE — Progress Notes (Signed)
Pt. Asleep , when awakened pt. States she is a 20 out of 10, then she goes back to sleep

## 2013-08-04 NOTE — Progress Notes (Signed)
Filed Vitals:   08/04/13 1417 08/04/13 1430 08/04/13 1516 08/04/13 1527  BP: 141/87 137/81  118/76  Pulse: 67 66  63  Temp:   97 F (36.1 C) 97.5 F (36.4 C)  TempSrc:      Resp: 15 16  16   SpO2: 100% 100%  93%    Patient resting in bed, has been groggy from the anesthetic, but clearing. Following commands. Moving all 4 extremities well. Dressing clean and dry. Foley to straight drainage. Has not yet been up and out of bed.  Plan: Encouraged to ambulate in the halls this evening, after dinner. And to increase ambulation further tomorrow. We'll continue to progress through postoperative recovery.  Hewitt Shorts, MD 08/04/2013, 5:48 PM

## 2013-08-05 NOTE — Progress Notes (Signed)
Filed Vitals:   08/04/13 1527 08/04/13 2000 08/05/13 0444 08/05/13 0756  BP: 118/76 126/80 114/66 109/67  Pulse: 63 77 82 84  Temp: 97.5 F (36.4 C) 98.4 F (36.9 C) 98.5 F (36.9 C) 99 F (37.2 C)  TempSrc:  Oral Oral   Resp: 16 18 18 18   SpO2: 93% 98% 98% 98%    Patient resting in bed, but has been up and ambulating. Mild discomfort. Dressing clean and dry. Foley DC'd at 0600, patient has not yet voided. Nursing staff to do bladder scan at 1200 if patient does not void. Encouraged to ambulate several more times today.  Plan: Monitor voiding function, continue to progress through postoperative recovery.  Hewitt Shorts, MD 08/05/2013, 11:06 AM

## 2013-08-06 MED ORDER — CYCLOBENZAPRINE HCL 10 MG PO TABS: 5.0000 mg | ORAL_TABLET | Freq: Three times a day (TID) | ORAL | Status: DC | PRN

## 2013-08-06 MED ORDER — HYDROCODONE-ACETAMINOPHEN 5-325 MG PO TABS: 1.0000 | ORAL_TABLET | ORAL | Status: DC | PRN

## 2013-08-06 MED FILL — Heparin Sodium (Porcine) Inj 1000 Unit/ML: INTRAMUSCULAR | Qty: 30 | Status: AC

## 2013-08-06 MED FILL — Sodium Chloride IV Soln 0.9%: INTRAVENOUS | Qty: 1000 | Status: AC

## 2013-08-06 NOTE — Discharge Summary (Signed)
Physician Discharge Summary  Patient ID: April Hunt MRN: [id number] DOB/AGE: 24-Aug-1951 62 y.o.  Admit date: 08/04/2013 Discharge date: 08/06/2013  Admission Diagnoses:  L4-5 Lumbar stenosis, Lumbar spondylosis, L4-5 dynamic grade 1-2 degenerative Spondylolisthesis, Lumbar radiculopathy  Discharge Diagnoses:  L4-5 Lumbar stenosis, Lumbar spondylosis, L4-5 dynamic grade 1-2 degenerative Spondylolisthesis, Lumbar radiculopathy  Discharged Condition: good  Hospital Course: Patient was admitted underwent a bilateral L4-5 lumbar decompression, PLIF, and PLA. Postoperatively she has made good progress. She is up and living in the halls. Her wound is healing well. He has been given instructions regarding wound care and activities. She is to return for followup in 3 weeks  Discharge Exam: Blood pressure 113/64, pulse 93, temperature 98.7 F (37.1 C), temperature source Oral, resp. rate 16, SpO2 98.00%.  Disposition: Home     Medication List         b complex vitamins capsule  Take 2 capsules by mouth daily.     BIOTIN PO  Take 1 tablet by mouth daily.     Calcium-Vitamin D 600-200 MG-UNIT Caps  Take 4 capsules by mouth daily.     CINNAMON PO  Take 2 tablets by mouth daily.     cyclobenzaprine 10 MG tablet  Commonly known as:  FLEXERIL  Take 0.5-1 tablets (5-10 mg total) by mouth 3 (three) times daily as needed for muscle spasms.     cyclobenzaprine 5 MG tablet  Commonly known as:  FLEXERIL  Take 5 mg by mouth 3 (three) times daily as needed for muscle spasms. For muscle spasms     fexofenadine 180 MG tablet  Commonly known as:  ALLEGRA  Take 180 mg by mouth daily.     fluticasone 50 MCG/ACT nasal spray  Commonly known as:  FLONASE  Place 2 sprays into the nose daily as needed. For allergies     HYDROcodone-acetaminophen 5-325 MG per tablet  Commonly known as:  NORCO/VICODIN  Take 1-2 tablets by mouth every 4 (four) hours as needed for moderate pain or severe pain.      Hydrocodone-Acetaminophen 5-300 MG Tabs  Take 1 tablet by mouth 2 (two) times daily as needed. For pain     ibuprofen 600 MG tablet  Commonly known as:  ADVIL,MOTRIN  Take 600 mg by mouth every 6 (six) hours as needed.     L-LYSINE PO  Take 1 tablet by mouth daily.     multivitamin with minerals tablet  Take 2 tablets by mouth daily.     nabumetone 500 MG tablet  Commonly known as:  RELAFEN  Take 500 mg by mouth 2 (two) times daily.     NON FORMULARY  Take 2-4 capsules by mouth daily. Rosemary capsule     NON FORMULARY  Take 2-4 capsules by mouth daily. Horsetail capsule         Signed: Hewitt Shorts, MD 08/06/2013, 12:46 PM

## 2013-08-06 NOTE — Progress Notes (Signed)
Pt and family given D/C instructions with Rx's, verbal understanding was given. Pt D/C'd home via wheelchair @ 1330 per MD order.  Pt stable @ D/C and had no other needs. Rema Fendt, RN

## 2013-11-27 ENCOUNTER — Other Ambulatory Visit (HOSPITAL_COMMUNITY): Payer: BC Managed Care – PPO

## 2013-12-03 ENCOUNTER — Other Ambulatory Visit: Payer: Self-pay | Admitting: Physician Assistant

## 2013-12-03 NOTE — H&P (Signed)
TOTAL KNEE ADMISSION H&P  Patient is being admitted for right total knee arthroplasty.  Subjective:  Chief Complaint:right knee pain.  HPI: April Hunt, 63 y.o. female, has a history of pain and functional disability in the right knee due to arthritis and has failed non-surgical conservative treatments for greater than 12 weeks to includeNSAID's and/or analgesics, corticosteriod injections, use of assistive devices and activity modification.  Onset of symptoms was gradual, starting 2 years ago with gradually worsening course since that time. The patient noted no past surgery on the right knee(s).  Patient currently rates pain in the right knee(s) at 10 out of 10 with activity. Patient has night pain, worsening of pain with activity and weight bearing, pain that interferes with activities of daily living, pain with passive range of motion, crepitus and joint swelling.  Patient has evidence of periarticular osteophytes and joint space narrowing by imaging studies.  There is no active infection.  Patient Active Problem List   Diagnosis Date Noted  . Headache 05/13/2013  . Sinusitis 03/04/2013  . Back pain 07/21/2012  . Routine general medical examination at a health care facility 03/19/2012  . Joint pain 03/19/2012  . Tinea versicolor 03/19/2012  . Tinea pedis 03/19/2012  . Otitis media 03/19/2012  . Neck pain 12/25/2011  . Asthma 12/25/2011  . 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 Elevated blood pressure reading without diagnosis of hypertension 12/25/2011   Past Medical History  Diagnosis Date  . Asthma     SEASONAL  . History of chicken pox   . Allergy     seasonal  . Positive PPD, treated   . History of shingles   . Complication of anesthesia     hard to wake up  . PONV (postoperative nausea and vomiting)   . GERD (gastroesophageal reflux disease)     OTC  . Arthritis     Past Surgical History  Procedure Laterality Date  . Abdominal hysterectomy  1998    Still has  cervix and both ovaries  . Meniscus repair  1995    left knee  . Knee arthroscopy  1997    right knee  . Lymph node resection  1999    benign     (Not in a hospital admission) Allergies  Allergen Reactions  . Aspirin Nausea And Vomiting  . Codeine Other (See Comments)    Jittery  . Other     NO BLOOD OR BLOOD PRODUCTS    History  Substance Use Topics  . Smoking status: Never Smoker   . Smokeless tobacco: Never Used  . Alcohol Use: Yes     Comment: rarely    Family History  Problem Relation Age of Onset  . Hyperlipidemia Mother   . Hypertension Mother   . Diabetes Father   . Tuberculosis Father   . Heart disease Paternal Aunt   . Cancer Paternal Uncle     colon cancer  . Cancer Cousin     breast     Review of Systems  Constitutional: Negative.   HENT: Negative for congestion, ear discharge, ear pain, hearing loss, nosebleeds, sore throat and tinnitus.   Eyes: Negative.   Respiratory: Positive for cough. Negative for hemoptysis, sputum production, shortness of breath, wheezing and stridor.   Cardiovascular: Positive for palpitations and leg swelling. Negative for chest pain, orthopnea, claudication and PND.  Gastrointestinal: Positive for heartburn, nausea, diarrhea and constipation. Negative for vomiting, abdominal pain, blood in stool and melena.  Genitourinary: Negative.  Musculoskeletal: Positive for back pain and joint pain. Negative for falls.  Skin: Positive for rash. Negative for itching.  Neurological: Positive for dizziness and headaches. Negative for tingling, tremors, sensory change, speech change, focal weakness, seizures and loss of consciousness.  Endo/Heme/Allergies: Negative.   Psychiatric/Behavioral: Negative.     Objective:  Physical Exam  Constitutional: She is oriented to person, place, and time. She appears well-developed and well-nourished. No distress.  HENT:  Head: Normocephalic and atraumatic.  Nose: Nose normal.  Eyes: Conjunctivae  and EOM are normal. Pupils are equal, round, and reactive to light.  Neck: Normal range of motion. Neck supple.  Cardiovascular: Normal rate, regular rhythm, normal heart sounds and intact distal pulses.   Respiratory: Effort normal and breath sounds normal. No respiratory distress. She has no wheezes. She has no rales. She exhibits no tenderness.  GI: Soft. Bowel sounds are normal. She exhibits no distension. There is no tenderness.  Musculoskeletal:       Right knee: She exhibits decreased range of motion and swelling. She exhibits no effusion, no erythema, no LCL laxity and no MCL laxity. Tenderness found. Medial joint line and lateral joint line tenderness noted.  Lymphadenopathy:    She has no cervical adenopathy.  Neurological: She is alert and oriented to person, place, and time. No cranial nerve deficit.  Skin: Skin is warm and dry. No rash noted. No erythema.  Psychiatric: She has a normal mood and affect. Her behavior is normal.    Vital signs in last 24 hours: @VSRANGES @  Labs:   Estimated body mass index is 30.54 kg/(m^2) as calculated from the following:   Height as of 07/30/13: 5\' 5"  (1.651 m).   Weight as of 07/30/13: 83.235 kg (183 lb 8 oz).   Imaging Review Plain radiographs demonstrate severe degenerative joint disease of the right knee(s). The overall alignment ismild varus. The bone quality appears to be good for age and reported activity level.  Assessment/Plan:  End stage arthritis, right knee   The patient history, physical examination, clinical judgment of the provider and imaging studies are consistent with end stage degenerative joint disease of the right knee(s) and total knee arthroplasty is deemed medically necessary. The treatment options including medical management, injection therapy arthroscopy and arthroplasty were discussed at length. The risks and benefits of total knee arthroplasty were presented and reviewed. The risks due to aseptic loosening,  infection, stiffness, patella tracking problems, thromboembolic complications and other imponderables were discussed. The patient acknowledged the explanation, agreed to proceed with the plan and consent was signed. Patient is being admitted for inpatient treatment for surgery, pain control, PT, OT, prophylactic antibiotics, VTE prophylaxis, progressive ambulation and ADL's and discharge planning. The patient is planning to be discharged home with home health services

## 2013-12-19 ENCOUNTER — Ambulatory Visit (HOSPITAL_COMMUNITY)
Admission: RE | Admit: 2013-12-19 | Discharge: 2013-12-19 | Disposition: A | Discharge status: Home / Self Care | Payer: BC Managed Care – PPO | Source: Ambulatory Visit | Attending: Physician Assistant | Admitting: Physician Assistant

## 2013-12-19 ENCOUNTER — Encounter (HOSPITAL_COMMUNITY): Payer: Self-pay

## 2013-12-19 ENCOUNTER — Encounter (HOSPITAL_COMMUNITY)
Admission: RE | Admit: 2013-12-19 | Discharge: 2013-12-19 | Disposition: A | Discharge status: Home / Self Care | Payer: BC Managed Care – PPO | Source: Ambulatory Visit | Attending: Orthopedic Surgery | Admitting: Orthopedic Surgery

## 2013-12-19 DIAGNOSIS — Z01818 Encounter for other preprocedural examination: Secondary | ICD-10-CM | POA: Insufficient documentation

## 2013-12-19 DIAGNOSIS — Z01812 Encounter for preprocedural laboratory examination: Secondary | ICD-10-CM | POA: Insufficient documentation

## 2013-12-19 HISTORY — DX: Essential (primary) hypertension: I10

## 2013-12-19 LAB — COMPREHENSIVE METABOLIC PANEL
ALK PHOS: 62 U/L (ref 39–117)
ALT: 30 U/L (ref 0–35)
AST: 31 U/L (ref 0–37)
Albumin: 3.9 g/dL (ref 3.5–5.2)
BILIRUBIN TOTAL: 0.2 mg/dL — AB (ref 0.3–1.2)
BUN: 8 mg/dL (ref 6–23)
CALCIUM: 9.9 mg/dL (ref 8.4–10.5)
CO2: 25 mEq/L (ref 19–32)
Chloride: 102 mEq/L (ref 96–112)
Creatinine, Ser: 0.79 mg/dL (ref 0.50–1.10)
GFR, EST NON AFRICAN AMERICAN: 89 mL/min — AB (ref >90)
GLUCOSE: 83 mg/dL (ref 70–99)
POTASSIUM: 4.1 meq/L (ref 3.7–5.3)
Sodium: 141 mEq/L (ref 137–147)
Total Protein: 7.7 g/dL (ref 6.0–8.3)

## 2013-12-19 LAB — CBC WITH DIFFERENTIAL/PLATELET
Basophils Absolute: 0 K/uL (ref 0.0–0.1)
Basophils Relative: 0 % (ref 0–1)
Eosinophils Absolute: 0.2 K/uL (ref 0.0–0.7)
Eosinophils Relative: 4 % (ref 0–5)
HCT: 38.8 % (ref 36.0–46.0)
Hemoglobin: 13.1 g/dL (ref 12.0–15.0)
Lymphocytes Relative: 43 % (ref 12–46)
Lymphs Abs: 2.1 K/uL (ref 0.7–4.0)
MCH: 30.5 pg (ref 26.0–34.0)
MCHC: 33.8 g/dL (ref 30.0–36.0)
MCV: 90.4 fL (ref 78.0–100.0)
Monocytes Absolute: 0.3 K/uL (ref 0.1–1.0)
Monocytes Relative: 5 % (ref 3–12)
Neutro Abs: 2.3 K/uL (ref 1.7–7.7)
Neutrophils Relative %: 48 % (ref 43–77)
Platelets: 251 K/uL (ref 150–400)
RBC: 4.29 MIL/uL (ref 3.87–5.11)
RDW: 13.5 % (ref 11.5–15.5)
WBC: 4.8 K/uL (ref 4.0–10.5)

## 2013-12-19 LAB — URINALYSIS, ROUTINE W REFLEX MICROSCOPIC
Bilirubin Urine: NEGATIVE
GLUCOSE, UA: NEGATIVE mg/dL
HGB URINE DIPSTICK: NEGATIVE
KETONES UR: NEGATIVE mg/dL
Leukocytes, UA: NEGATIVE
Nitrite: NEGATIVE
PH: 7 (ref 5.0–8.0)
PROTEIN: NEGATIVE mg/dL
Specific Gravity, Urine: 1.008 (ref 1.005–1.030)
Urobilinogen, UA: 0.2 mg/dL (ref 0.0–1.0)

## 2013-12-19 LAB — PROTIME-INR
INR: 0.88 (ref 0.00–1.49)
Prothrombin Time: 11.8 seconds (ref 11.6–15.2)

## 2013-12-19 LAB — APTT: aPTT: 29 seconds (ref 24–37)

## 2013-12-19 LAB — SURGICAL PCR SCREEN
MRSA, PCR: NEGATIVE
Staphylococcus aureus: NEGATIVE

## 2013-12-19 MED ORDER — CHLORHEXIDINE GLUCONATE 4 % EX LIQD: 60.0000 mL | Freq: Once | CUTANEOUS | Status: DC

## 2013-12-19 NOTE — Pre-Procedure Instructions (Signed)
April Hunt  12/19/2013   Your procedure is scheduled on:  12/29/13  [facility] at 530 AM.  Call this number if you have problems the morning of surgery: [phone]   Remember:   Do not eat food or drink liquids after midnight.   Take these medicines the morning of surgery with A SIP OF WATER: hydrocodone,flonase,allegra   Do not wear jewelry, make-up or nail polish.  Do not wear lotions, powders, or perfumes. You may wear deodorant.  Do not shave 48 hours prior to surgery. Men may shave face and neck.  Do not bring valuables to the hospital.  [facility] is not responsible                  for any belongings or valuables.               Contacts, dentures or bridgework may not be worn into surgery.  Leave suitcase in the car. After surgery it may be brought to your room.  For patients admitted to the hospital, discharge time is determined by your                treatment team.               Patients discharged the day of surgery will not be allowed to drive  home.  Name and phone number of your driver: family  Special Instructions: Incentive Spirometry - Practice and bring it with you on the day of surgery.   Please read over the following fact sheets that you were given: Pain Booklet, Coughing and Deep Breathing, Blood Transfusion Information, MRSA Information and Surgical Site Infection Prevention

## 2013-12-19 NOTE — Pre-Procedure Instructions (Signed)
April Hunt  12/19/2013   Your procedure is scheduled on:  12/26/13  [facility]  [facility] 3 at 530 AM.  Call this number if you have problems the morning of surgery: [phone]   Remember:   Do not eat food or drink liquids after midnight.   Take these medicines the morning of surgery with A SIP OF WATER: flexeril,flonase,hydrocodone   Do not wear jewelry, make-up or nail polish.  Do not wear lotions, powders, or perfumes. You may wear deodorant.  Do not shave 48 hours prior to surgery. Men may shave face and neck.  Do not bring valuables to the hospital.  [facility] is not responsible                  for any belongings or valuables.               Contacts, dentures or bridgework may not be worn into surgery.  Leave suitcase in the car. After surgery it may be brought to your room.  For patients admitted to the hospital, discharge time is determined by your                treatment team.               Patients discharged the day of surgery will not be allowed to drive  home.  Name and phone number of your driver: family  Special Instructions: Shower using CHG 2 nights before surgery and the night before surgery.  If you shower the day of surgery use CHG.  Use special wash - you have one bottle of CHG for all showers.  You should use approximately 1/3 of the bottle for each shower.   Please read over the following fact sheets that you were given: Pain Booklet, Coughing and Deep Breathing, Blood Transfusion Information, MRSA Information and Surgical Site Infection Prevention

## 2013-12-20 LAB — URINE CULTURE: Colony Count: 7000

## 2013-12-25 MED ORDER — SODIUM CHLORIDE 0.9 % IV SOLN
1000.0000 mg | INTRAVENOUS | Status: AC
  Administered 2013-12-26: 1000 mg via INTRAVENOUS
  Filled 2013-12-25 (×2): qty 10

## 2013-12-25 MED ORDER — CEFAZOLIN SODIUM-DEXTROSE 2-3 GM-% IV SOLR
2.0000 g | INTRAVENOUS | Status: AC
  Administered 2013-12-26: 2 g via INTRAVENOUS
  Filled 2013-12-25: qty 50

## 2013-12-26 ENCOUNTER — Inpatient Hospital Stay (HOSPITAL_COMMUNITY)
Admission: RE | Admit: 2013-12-26 | Discharge: 2013-12-29 | DRG: 470 | Disposition: A | Discharge status: Home / Self Care | Payer: BC Managed Care – PPO | Source: Ambulatory Visit | Attending: Orthopedic Surgery | Admitting: Orthopedic Surgery

## 2013-12-26 ENCOUNTER — Inpatient Hospital Stay (HOSPITAL_COMMUNITY): Payer: BC Managed Care – PPO | Admitting: Anesthesiology

## 2013-12-26 ENCOUNTER — Encounter (HOSPITAL_COMMUNITY): Payer: Self-pay | Admitting: Anesthesiology

## 2013-12-26 ENCOUNTER — Encounter (HOSPITAL_COMMUNITY)
Admission: RE | Disposition: A | Discharge status: Home / Self Care | Payer: Self-pay | Source: Ambulatory Visit | Attending: Orthopedic Surgery

## 2013-12-26 ENCOUNTER — Encounter (HOSPITAL_COMMUNITY): Payer: BC Managed Care – PPO | Admitting: Anesthesiology

## 2013-12-26 DIAGNOSIS — K219 Gastro-esophageal reflux disease without esophagitis: Secondary | ICD-10-CM | POA: Diagnosis present

## 2013-12-26 DIAGNOSIS — M1711 Unilateral primary osteoarthritis, right knee: Secondary | ICD-10-CM | POA: Diagnosis present

## 2013-12-26 DIAGNOSIS — M1710 Unilateral primary osteoarthritis, unspecified knee: Principal | ICD-10-CM | POA: Diagnosis present

## 2013-12-26 DIAGNOSIS — D62 Acute posthemorrhagic anemia: Secondary | ICD-10-CM | POA: Diagnosis not present

## 2013-12-26 HISTORY — DX: Unilateral primary osteoarthritis, right knee: M17.11

## 2013-12-26 HISTORY — DX: Family history of other specified conditions: Z84.89

## 2013-12-26 HISTORY — PX: TOTAL KNEE ARTHROPLASTY: SHX125

## 2013-12-26 HISTORY — DX: Reserved for inherently not codable concepts without codable children: IMO0001

## 2013-12-26 HISTORY — DX: Procedure and treatment not carried out because of patient's decision for reasons of belief and group pressure: Z53.1

## 2013-12-26 LAB — CREATININE, SERUM: Creatinine, Ser: 0.75 mg/dL (ref 0.50–1.10)

## 2013-12-26 LAB — CBC
HEMATOCRIT: 37.2 % (ref 36.0–46.0)
Hemoglobin: 12.6 g/dL (ref 12.0–15.0)
MCH: 30.3 pg (ref 26.0–34.0)
MCHC: 33.9 g/dL (ref 30.0–36.0)
MCV: 89.4 fL (ref 78.0–100.0)
Platelets: 237 10*3/uL (ref 150–400)
RBC: 4.16 MIL/uL (ref 3.87–5.11)
RDW: 13.1 % (ref 11.5–15.5)
WBC: 12.3 10*3/uL — ABNORMAL HIGH (ref 4.0–10.5)

## 2013-12-26 SURGERY — ARTHROPLASTY, KNEE, TOTAL
Anesthesia: General | Site: Knee | Laterality: Right

## 2013-12-26 MED ORDER — FENTANYL CITRATE 0.05 MG/ML IJ SOLN
INTRAMUSCULAR | Status: AC
  Filled 2013-12-26: qty 5

## 2013-12-26 MED ORDER — LIDOCAINE HCL (CARDIAC) 20 MG/ML IV SOLN
INTRAVENOUS | Status: DC | PRN
  Administered 2013-12-26: 80 mg via INTRAVENOUS

## 2013-12-26 MED ORDER — LACTATED RINGERS IV SOLN
INTRAVENOUS | Status: DC | PRN
  Administered 2013-12-26 (×2): via INTRAVENOUS

## 2013-12-26 MED ORDER — HYDROCHLOROTHIAZIDE 12.5 MG PO CAPS
12.5000 mg | ORAL_CAPSULE | Freq: Every day | ORAL | Status: DC
  Administered 2013-12-26 – 2013-12-29 (×4): 12.5 mg via ORAL
  Filled 2013-12-26 (×4): qty 1

## 2013-12-26 MED ORDER — HYDROMORPHONE HCL PF 1 MG/ML IJ SOLN
1.0000 mg | INTRAMUSCULAR | Status: DC | PRN
  Administered 2013-12-26 – 2013-12-27 (×3): 1 mg via INTRAVENOUS
  Filled 2013-12-26 (×3): qty 1

## 2013-12-26 MED ORDER — FLUTICASONE PROPIONATE 50 MCG/ACT NA SUSP
2.0000 | Freq: Every day | NASAL | Status: DC | PRN
  Filled 2013-12-26: qty 16

## 2013-12-26 MED ORDER — HYDROMORPHONE HCL PF 1 MG/ML IJ SOLN
0.2500 mg | INTRAMUSCULAR | Status: DC | PRN
  Administered 2013-12-26 (×4): 0.5 mg via INTRAVENOUS

## 2013-12-26 MED ORDER — MENTHOL 3 MG MT LOZG
1.0000 | LOZENGE | OROMUCOSAL | Status: DC | PRN
Start: 2013-12-26 — End: 2013-12-29

## 2013-12-26 MED ORDER — BUPIVACAINE-EPINEPHRINE PF 0.5-1:200000 % IJ SOLN
INTRAMUSCULAR | Status: DC | PRN
  Administered 2013-12-26: 25 mL

## 2013-12-26 MED ORDER — METOCLOPRAMIDE HCL 10 MG PO TABS: 5.0000 mg | ORAL_TABLET | Freq: Three times a day (TID) | ORAL | Status: DC | PRN

## 2013-12-26 MED ORDER — MIDAZOLAM HCL 2 MG/2ML IJ SOLN: 1.0000 mg | INTRAMUSCULAR | Status: DC | PRN

## 2013-12-26 MED ORDER — ADULT MULTIVITAMIN W/MINERALS CH
1.0000 | ORAL_TABLET | Freq: Every day | ORAL | Status: DC
  Administered 2013-12-26 – 2013-12-29 (×4): 1 via ORAL
  Filled 2013-12-26 (×4): qty 1

## 2013-12-26 MED ORDER — FENTANYL CITRATE 0.05 MG/ML IJ SOLN: 50.0000 ug | Freq: Once | INTRAMUSCULAR | Status: DC

## 2013-12-26 MED ORDER — OXYCODONE HCL 5 MG PO TABS
5.0000 mg | ORAL_TABLET | ORAL | Status: DC | PRN
  Administered 2013-12-26 – 2013-12-29 (×13): 10 mg via ORAL
  Filled 2013-12-26 (×7): qty 2
  Filled 2013-12-26: qty 1
  Filled 2013-12-26 (×6): qty 2

## 2013-12-26 MED ORDER — ONDANSETRON HCL 4 MG/2ML IJ SOLN
INTRAMUSCULAR | Status: DC | PRN
  Administered 2013-12-26: 4 mg via INTRAVENOUS

## 2013-12-26 MED ORDER — CALCIUM CARBONATE-VITAMIN D 500-200 MG-UNIT PO TABS
2.0000 | ORAL_TABLET | Freq: Every day | ORAL | Status: DC
  Administered 2013-12-27 – 2013-12-29 (×3): 2 via ORAL
  Filled 2013-12-26 (×4): qty 2

## 2013-12-26 MED ORDER — LORATADINE 10 MG PO TABS
10.0000 mg | ORAL_TABLET | Freq: Every day | ORAL | Status: DC
  Administered 2013-12-27 – 2013-12-29 (×3): 10 mg via ORAL
  Filled 2013-12-26 (×3): qty 1

## 2013-12-26 MED ORDER — SODIUM CHLORIDE 0.9 % IR SOLN
Status: DC | PRN
Start: 2013-12-26 — End: 2013-12-26
  Administered 2013-12-26: 1000 mL

## 2013-12-26 MED ORDER — PROMETHAZINE HCL 25 MG/ML IJ SOLN: 6.2500 mg | INTRAMUSCULAR | Status: DC | PRN

## 2013-12-26 MED ORDER — ENOXAPARIN SODIUM 30 MG/0.3ML ~~LOC~~ SOLN
30.0000 mg | Freq: Two times a day (BID) | SUBCUTANEOUS | Status: DC
  Administered 2013-12-27 – 2013-12-29 (×5): 30 mg via SUBCUTANEOUS
  Filled 2013-12-26 (×7): qty 0.3

## 2013-12-26 MED ORDER — ROCURONIUM BROMIDE 50 MG/5ML IV SOLN
INTRAVENOUS | Status: AC
  Filled 2013-12-26: qty 1

## 2013-12-26 MED ORDER — ENOXAPARIN SODIUM 30 MG/0.3ML ~~LOC~~ SOLN: 30.0000 mg | Freq: Two times a day (BID) | SUBCUTANEOUS | Status: DC

## 2013-12-26 MED ORDER — PHENYLEPHRINE 40 MCG/ML (10ML) SYRINGE FOR IV PUSH (FOR BLOOD PRESSURE SUPPORT)
PREFILLED_SYRINGE | INTRAVENOUS | Status: AC
  Filled 2013-12-26: qty 10

## 2013-12-26 MED ORDER — FLEET ENEMA 7-19 GM/118ML RE ENEM: 1.0000 | ENEMA | Freq: Once | RECTAL | Status: AC | PRN

## 2013-12-26 MED ORDER — ONDANSETRON HCL 4 MG/2ML IJ SOLN
INTRAMUSCULAR | Status: AC
  Filled 2013-12-26: qty 2

## 2013-12-26 MED ORDER — CYCLOBENZAPRINE HCL 10 MG PO TABS
5.0000 mg | ORAL_TABLET | Freq: Three times a day (TID) | ORAL | Status: DC | PRN
  Administered 2013-12-27 – 2013-12-28 (×5): 10 mg via ORAL
  Filled 2013-12-26 (×5): qty 1

## 2013-12-26 MED ORDER — DEXAMETHASONE 6 MG PO TABS
10.0000 mg | ORAL_TABLET | Freq: Three times a day (TID) | ORAL | Status: AC
  Administered 2013-12-26 – 2013-12-27 (×2): 10 mg via ORAL
  Filled 2013-12-26 (×3): qty 1

## 2013-12-26 MED ORDER — MIDAZOLAM HCL 2 MG/2ML IJ SOLN
INTRAMUSCULAR | Status: AC
  Filled 2013-12-26: qty 2

## 2013-12-26 MED ORDER — MIDAZOLAM HCL 5 MG/5ML IJ SOLN
INTRAMUSCULAR | Status: DC | PRN
  Administered 2013-12-26: 1 mg via INTRAVENOUS

## 2013-12-26 MED ORDER — METOCLOPRAMIDE HCL 5 MG/ML IJ SOLN: 5.0000 mg | Freq: Three times a day (TID) | INTRAMUSCULAR | Status: DC | PRN

## 2013-12-26 MED ORDER — PROPOFOL 10 MG/ML IV BOLUS
INTRAVENOUS | Status: DC | PRN
  Administered 2013-12-26: 150 mg via INTRAVENOUS

## 2013-12-26 MED ORDER — FENTANYL CITRATE 0.05 MG/ML IJ SOLN
INTRAMUSCULAR | Status: DC | PRN
  Administered 2013-12-26: 50 ug via INTRAVENOUS
  Administered 2013-12-26: 100 ug via INTRAVENOUS
  Administered 2013-12-26 (×2): 50 ug via INTRAVENOUS

## 2013-12-26 MED ORDER — CALCIUM-VITAMIN D 600-200 MG-UNIT PO CAPS: 4.0000 | ORAL_CAPSULE | Freq: Every day | ORAL | Status: DC

## 2013-12-26 MED ORDER — DEXAMETHASONE SODIUM PHOSPHATE 10 MG/ML IJ SOLN
INTRAMUSCULAR | Status: DC | PRN
  Administered 2013-12-26: 4 mg

## 2013-12-26 MED ORDER — HYDROMORPHONE HCL PF 1 MG/ML IJ SOLN
INTRAMUSCULAR | Status: AC
  Administered 2013-12-26: 0.5 mg via INTRAVENOUS
  Filled 2013-12-26: qty 1

## 2013-12-26 MED ORDER — DEXAMETHASONE SODIUM PHOSPHATE 10 MG/ML IJ SOLN
INTRAMUSCULAR | Status: DC | PRN
  Administered 2013-12-26: 10 mg via INTRAVENOUS

## 2013-12-26 MED ORDER — OXYCODONE HCL 5 MG PO TABS: ORAL_TABLET | ORAL | Status: DC

## 2013-12-26 MED ORDER — BISACODYL 10 MG RE SUPP: 10.0000 mg | Freq: Every day | RECTAL | Status: DC | PRN

## 2013-12-26 MED ORDER — SODIUM CHLORIDE 0.9 % IV SOLN
INTRAVENOUS | Status: DC
  Administered 2013-12-27: 03:00:00 via INTRAVENOUS

## 2013-12-26 MED ORDER — SENNOSIDES-DOCUSATE SODIUM 8.6-50 MG PO TABS: 1.0000 | ORAL_TABLET | Freq: Every evening | ORAL | Status: DC | PRN

## 2013-12-26 MED ORDER — PHENOL 1.4 % MT LIQD: 1.0000 | OROMUCOSAL | Status: DC | PRN

## 2013-12-26 MED ORDER — DEXAMETHASONE SODIUM PHOSPHATE 10 MG/ML IJ SOLN
10.0000 mg | Freq: Three times a day (TID) | INTRAMUSCULAR | Status: AC
  Administered 2013-12-26: 10 mg via INTRAVENOUS
  Filled 2013-12-26 (×3): qty 1

## 2013-12-26 MED ORDER — ACETAMINOPHEN 650 MG RE SUPP: 650.0000 mg | Freq: Four times a day (QID) | RECTAL | Status: DC | PRN

## 2013-12-26 MED ORDER — EPHEDRINE SULFATE 50 MG/ML IJ SOLN
INTRAMUSCULAR | Status: AC
  Filled 2013-12-26: qty 1

## 2013-12-26 MED ORDER — ONDANSETRON HCL 4 MG/2ML IJ SOLN
4.0000 mg | Freq: Four times a day (QID) | INTRAMUSCULAR | Status: DC | PRN
  Administered 2013-12-26: 4 mg via INTRAVENOUS
  Filled 2013-12-26: qty 2

## 2013-12-26 MED ORDER — OXYCODONE HCL 5 MG PO TABS
ORAL_TABLET | ORAL | Status: AC
  Administered 2013-12-26: 10 mg via ORAL
  Filled 2013-12-26: qty 2

## 2013-12-26 MED ORDER — ONDANSETRON HCL 4 MG PO TABS: 4.0000 mg | ORAL_TABLET | Freq: Four times a day (QID) | ORAL | Status: DC | PRN

## 2013-12-26 MED ORDER — LABETALOL HCL 5 MG/ML IV SOLN
INTRAVENOUS | Status: DC | PRN
  Administered 2013-12-26: 5 mg via INTRAVENOUS
  Administered 2013-12-26 (×2): 10 mg via INTRAVENOUS

## 2013-12-26 MED ORDER — LIDOCAINE HCL (CARDIAC) 20 MG/ML IV SOLN
INTRAVENOUS | Status: AC
  Filled 2013-12-26: qty 5

## 2013-12-26 MED ORDER — SODIUM CHLORIDE 0.9 % IJ SOLN
INTRAMUSCULAR | Status: AC
Start: 2013-12-26 — End: 2013-12-26
  Filled 2013-12-26: qty 10

## 2013-12-26 MED ORDER — SUCCINYLCHOLINE CHLORIDE 20 MG/ML IJ SOLN
INTRAMUSCULAR | Status: DC | PRN
  Administered 2013-12-26: 100 mg via INTRAVENOUS

## 2013-12-26 MED ORDER — 0.9 % SODIUM CHLORIDE (POUR BTL) OPTIME
TOPICAL | Status: DC | PRN
  Administered 2013-12-26: 1000 mL

## 2013-12-26 MED ORDER — PROPOFOL 10 MG/ML IV BOLUS
INTRAVENOUS | Status: AC
  Filled 2013-12-26: qty 20

## 2013-12-26 MED ORDER — MULTI-VITAMIN/MINERALS PO TABS: 2.0000 | ORAL_TABLET | Freq: Every day | ORAL | Status: DC

## 2013-12-26 MED ORDER — CHLORHEXIDINE GLUCONATE 4 % EX LIQD
60.0000 mL | Freq: Once | CUTANEOUS | Status: DC
  Filled 2013-12-26: qty 60

## 2013-12-26 MED ORDER — CEFAZOLIN SODIUM 1-5 GM-% IV SOLN
1.0000 g | Freq: Four times a day (QID) | INTRAVENOUS | Status: AC
  Administered 2013-12-26 (×2): 1 g via INTRAVENOUS
  Filled 2013-12-26 (×2): qty 50

## 2013-12-26 MED ORDER — SODIUM CHLORIDE 0.9 % IV SOLN: INTRAVENOUS | Status: DC

## 2013-12-26 MED ORDER — ACETAMINOPHEN 325 MG PO TABS: 650.0000 mg | ORAL_TABLET | Freq: Four times a day (QID) | ORAL | Status: DC | PRN

## 2013-12-26 MED ORDER — SUCCINYLCHOLINE CHLORIDE 20 MG/ML IJ SOLN
INTRAMUSCULAR | Status: AC
  Filled 2013-12-26: qty 1

## 2013-12-26 MED ORDER — DOCUSATE SODIUM 100 MG PO CAPS
100.0000 mg | ORAL_CAPSULE | Freq: Two times a day (BID) | ORAL | Status: DC
  Administered 2013-12-26 – 2013-12-29 (×6): 100 mg via ORAL
  Filled 2013-12-26 (×7): qty 1

## 2013-12-26 SURGICAL SUPPLY — 68 items
BANDAGE ELASTIC 4 VELCRO ST LF (GAUZE/BANDAGES/DRESSINGS) ×3 IMPLANT
BANDAGE ELASTIC 6 VELCRO ST LF (GAUZE/BANDAGES/DRESSINGS) ×3 IMPLANT
BANDAGE ESMARK 6X9 LF (GAUZE/BANDAGES/DRESSINGS) ×1 IMPLANT
BLADE SAGITTAL 25.0X1.19X90 (BLADE) ×2 IMPLANT
BLADE SAGITTAL 25.0X1.19X90MM (BLADE) ×1
BLADE SAW SAG 90X13X1.27 (BLADE) ×3 IMPLANT
BNDG ESMARK 6X9 LF (GAUZE/BANDAGES/DRESSINGS) ×3
BOWL SMART MIX CTS (DISPOSABLE) ×3 IMPLANT
CAPT RP KNEE ×3 IMPLANT
CEMENT HV SMART SET (Cement) ×6 IMPLANT
COVER SURGICAL LIGHT HANDLE (MISCELLANEOUS) ×3 IMPLANT
CUFF TOURNIQUET SINGLE 34IN LL (TOURNIQUET CUFF) ×3 IMPLANT
CUFF TOURNIQUET SINGLE 44IN (TOURNIQUET CUFF) IMPLANT
DRAPE INCISE IOBAN 66X45 STRL (DRAPES) IMPLANT
DRAPE ORTHO SPLIT 77X108 STRL (DRAPES) ×4
DRAPE SURG ORHT 6 SPLT 77X108 (DRAPES) ×2 IMPLANT
DRAPE U-SHAPE 47X51 STRL (DRAPES) ×3 IMPLANT
DRSG ADAPTIC 3X8 NADH LF (GAUZE/BANDAGES/DRESSINGS) ×3 IMPLANT
DRSG PAD ABDOMINAL 8X10 ST (GAUZE/BANDAGES/DRESSINGS) ×6 IMPLANT
DURAPREP 26ML APPLICATOR (WOUND CARE) ×6 IMPLANT
ELECT REM PT RETURN 9FT ADLT (ELECTROSURGICAL) ×3
ELECTRODE REM PT RTRN 9FT ADLT (ELECTROSURGICAL) ×1 IMPLANT
EVACUATOR 1/8 PVC DRAIN (DRAIN) ×3 IMPLANT
FACESHIELD WRAPAROUND (MASK) ×3 IMPLANT
FLOSEAL 10ML (HEMOSTASIS) IMPLANT
GLOVE BIOGEL PI IND STRL 7.0 (GLOVE) ×1 IMPLANT
GLOVE BIOGEL PI IND STRL 8 (GLOVE) ×2 IMPLANT
GLOVE BIOGEL PI INDICATOR 7.0 (GLOVE) ×2
GLOVE BIOGEL PI INDICATOR 8 (GLOVE) ×4
GLOVE ECLIPSE 6.5 STRL STRAW (GLOVE) ×6 IMPLANT
GLOVE ECLIPSE 7.0 STRL STRAW (GLOVE) ×3 IMPLANT
GLOVE ORTHO TXT STRL SZ7.5 (GLOVE) ×6 IMPLANT
GLOVE SURG ORTHO 8.0 STRL STRW (GLOVE) ×3 IMPLANT
GOWN STRL REUS W/ TWL LRG LVL3 (GOWN DISPOSABLE) ×2 IMPLANT
GOWN STRL REUS W/ TWL XL LVL3 (GOWN DISPOSABLE) ×1 IMPLANT
GOWN STRL REUS W/TWL 2XL LVL3 (GOWN DISPOSABLE) ×3 IMPLANT
GOWN STRL REUS W/TWL LRG LVL3 (GOWN DISPOSABLE) ×4
GOWN STRL REUS W/TWL XL LVL3 (GOWN DISPOSABLE) ×2
HANDPIECE INTERPULSE COAX TIP (DISPOSABLE) ×2
HOOD PEEL AWAY FACE SHEILD DIS (HOOD) ×3 IMPLANT
IMMOBILIZER KNEE 22 UNIV (SOFTGOODS) ×3 IMPLANT
KIT BASIN OR (CUSTOM PROCEDURE TRAY) ×3 IMPLANT
KIT ROOM TURNOVER OR (KITS) ×3 IMPLANT
MANIFOLD NEPTUNE II (INSTRUMENTS) ×3 IMPLANT
NEEDLE 22X1 1/2 (OR ONLY) (NEEDLE) IMPLANT
NS IRRIG 1000ML POUR BTL (IV SOLUTION) ×3 IMPLANT
PACK TOTAL JOINT (CUSTOM PROCEDURE TRAY) ×3 IMPLANT
PAD ABD 8X10 STRL (GAUZE/BANDAGES/DRESSINGS) ×6 IMPLANT
PAD ARMBOARD 7.5X6 YLW CONV (MISCELLANEOUS) ×3 IMPLANT
PAD CAST 4YDX4 CTTN HI CHSV (CAST SUPPLIES) ×1 IMPLANT
PADDING CAST COTTON 4X4 STRL (CAST SUPPLIES) ×2
PADDING CAST COTTON 6X4 STRL (CAST SUPPLIES) ×3 IMPLANT
SET HNDPC FAN SPRY TIP SCT (DISPOSABLE) ×1 IMPLANT
SPONGE GAUZE 4X4 12PLY (GAUZE/BANDAGES/DRESSINGS) ×3 IMPLANT
STAPLER VISISTAT 35W (STAPLE) ×3 IMPLANT
SUCTION FRAZIER TIP 10 FR DISP (SUCTIONS) ×3 IMPLANT
SUT ETHIBOND NAB CT1 #1 30IN (SUTURE) ×9 IMPLANT
SUT VIC AB 0 CT1 27 (SUTURE) ×2
SUT VIC AB 0 CT1 27XBRD ANBCTR (SUTURE) ×1 IMPLANT
SUT VIC AB 1 CT1 27 (SUTURE) ×4
SUT VIC AB 1 CT1 27XBRD ANBCTR (SUTURE) ×2 IMPLANT
SUT VIC AB 2-0 CT1 27 (SUTURE) ×2
SUT VIC AB 2-0 CT1 TAPERPNT 27 (SUTURE) ×1 IMPLANT
SYR CONTROL 10ML LL (SYRINGE) IMPLANT
TOWEL OR 17X24 6PK STRL BLUE (TOWEL DISPOSABLE) ×3 IMPLANT
TOWEL OR 17X26 10 PK STRL BLUE (TOWEL DISPOSABLE) ×3 IMPLANT
TRAY FOLEY CATH 16FRSI W/METER (SET/KITS/TRAYS/PACK) IMPLANT
WATER STERILE IRR 1000ML POUR (IV SOLUTION) ×6 IMPLANT

## 2013-12-26 NOTE — Brief Op Note (Signed)
12/26/2013  10:00 AM  PATIENT:  April Hunt  63 y.o. female  PRE-OPERATIVE DIAGNOSIS:  OA RIGHT KNEE  POST-OPERATIVE DIAGNOSIS:  OA RIGHT KNEE  PROCEDURE:  Procedure(s): RIGHT TOTAL KNEE ARTHROPLASTY (Right)  SURGEON:  Surgeon(s) and Role:    * W D Carloyn Manneraffrey Jr., MD - Primary  PHYSICIAN ASSISTANT: Margart SicklesJoshua Ashaki Frosch, PA-C  ASSISTANTS:   ANESTHESIA:   regional and general  EBL:  Total I/O In: 1200 [I.V.:1200] Out: -   BLOOD ADMINISTERED:none  DRAINS: 1 hemovac drain lateral right knee self suction   LOCAL MEDICATIONS USED:  NONE  SPECIMEN:  No Specimen  DISPOSITION OF SPECIMEN:  N/A  COUNTS:  YES  TOURNIQUET:   Total Tourniquet Time Documented: Thigh (Right) - 66 minutes Total: Thigh (Right) - 66 minutes   DICTATION: .Other Dictation: Dictation Number unknown  PLAN OF CARE: Admit to inpatient   PATIENT DISPOSITION:  PACU - hemodynamically stable.   Delay start of Pharmacological VTE agent (>24hrs) due to surgical blood loss or risk of bleeding: yes

## 2013-12-26 NOTE — Interval H&P Note (Signed)
History and Physical Interval Note:  12/26/2013 7:18 AM  April Hunt  has presented today for surgery, with the diagnosis of OA RIGHT KNEE  The various methods of treatment have been discussed with the patient and family. After consideration of risks, benefits and other options for treatment, the patient has consented to  Procedure(s): RIGHT TOTAL KNEE ARTHROPLASTY (Right) as a surgical intervention .  The patient's history has been reviewed, patient examined, no change in status, stable for surgery.  I have reviewed the patient's chart and labs.  Questions were answered to the patient's satisfaction.     Thera FlakeW D Alleen Kehm Jr.

## 2013-12-26 NOTE — H&P (View-Only) (Signed)
TOTAL KNEE ADMISSION H&P  Patient is being admitted for right total knee arthroplasty.  Subjective:  Chief Complaint:right knee pain.  HPI: April Hunt, 63 y.o. female, has a history of pain and functional disability in the right knee due to arthritis and has failed non-surgical conservative treatments for greater than 12 weeks to includeNSAID's and/or analgesics, corticosteriod injections, use of assistive devices and activity modification.  Onset of symptoms was gradual, starting 2 years ago with gradually worsening course since that time. The patient noted no past surgery on the right knee(s).  Patient currently rates pain in the right knee(s) at 10 out of 10 with activity. Patient has night pain, worsening of pain with activity and weight bearing, pain that interferes with activities of daily living, pain with passive range of motion, crepitus and joint swelling.  Patient has evidence of periarticular osteophytes and joint space narrowing by imaging studies.  There is no active infection.  Patient Active Problem List   Diagnosis Date Noted  . Headache 05/13/2013  . Sinusitis 03/04/2013  . Back pain 07/21/2012  . Routine general medical examination at a health care facility 03/19/2012  . Joint pain 03/19/2012  . Tinea versicolor 03/19/2012  . Tinea pedis 03/19/2012  . Otitis media 03/19/2012  . Neck pain 12/25/2011  . Asthma 12/25/2011  . 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 Elevated blood pressure reading without diagnosis of hypertension 12/25/2011   Past Medical History  Diagnosis Date  . Asthma     SEASONAL  . History of chicken pox   . Allergy     seasonal  . Positive PPD, treated   . History of shingles   . Complication of anesthesia     hard to wake up  . PONV (postoperative nausea and vomiting)   . GERD (gastroesophageal reflux disease)     OTC  . Arthritis     Past Surgical History  Procedure Laterality Date  . Abdominal hysterectomy  1998    Still has  cervix and both ovaries  . Meniscus repair  1995    left knee  . Knee arthroscopy  1997    right knee  . Lymph node resection  1999    benign     (Not in a hospital admission) Allergies  Allergen Reactions  . Aspirin Nausea And Vomiting  . Codeine Other (See Comments)    Jittery  . Other     NO BLOOD OR BLOOD PRODUCTS    History  Substance Use Topics  . Smoking status: Never Smoker   . Smokeless tobacco: Never Used  . Alcohol Use: Yes     Comment: rarely    Family History  Problem Relation Age of Onset  . Hyperlipidemia Mother   . Hypertension Mother   . Diabetes Father   . Tuberculosis Father   . Heart disease Paternal Aunt   . Cancer Paternal Uncle     colon cancer  . Cancer Cousin     breast     Review of Systems  Constitutional: Negative.   HENT: Negative for congestion, ear discharge, ear pain, hearing loss, nosebleeds, sore throat and tinnitus.   Eyes: Negative.   Respiratory: Positive for cough. Negative for hemoptysis, sputum production, shortness of breath, wheezing and stridor.   Cardiovascular: Positive for palpitations and leg swelling. Negative for chest pain, orthopnea, claudication and PND.  Gastrointestinal: Positive for heartburn, nausea, diarrhea and constipation. Negative for vomiting, abdominal pain, blood in stool and melena.  Genitourinary: Negative.  Musculoskeletal: Positive for back pain and joint pain. Negative for falls.  Skin: Positive for rash. Negative for itching.  Neurological: Positive for dizziness and headaches. Negative for tingling, tremors, sensory change, speech change, focal weakness, seizures and loss of consciousness.  Endo/Heme/Allergies: Negative.   Psychiatric/Behavioral: Negative.     Objective:  Physical Exam  Constitutional: She is oriented to person, place, and time. She appears well-developed and well-nourished. No distress.  HENT:  Head: Normocephalic and atraumatic.  Nose: Nose normal.  Eyes: Conjunctivae  and EOM are normal. Pupils are equal, round, and reactive to light.  Neck: Normal range of motion. Neck supple.  Cardiovascular: Normal rate, regular rhythm, normal heart sounds and intact distal pulses.   Respiratory: Effort normal and breath sounds normal. No respiratory distress. She has no wheezes. She has no rales. She exhibits no tenderness.  GI: Soft. Bowel sounds are normal. She exhibits no distension. There is no tenderness.  Musculoskeletal:       Right knee: She exhibits decreased range of motion and swelling. She exhibits no effusion, no erythema, no LCL laxity and no MCL laxity. Tenderness found. Medial joint line and lateral joint line tenderness noted.  Lymphadenopathy:    She has no cervical adenopathy.  Neurological: She is alert and oriented to person, place, and time. No cranial nerve deficit.  Skin: Skin is warm and dry. No rash noted. No erythema.  Psychiatric: She has a normal mood and affect. Her behavior is normal.    Vital signs in last 24 hours: @VSRANGES @  Labs:   Estimated body mass index is 30.54 kg/(m^2) as calculated from the following:   Height as of 07/30/13: 5\' 5"  (1.651 m).   Weight as of 07/30/13: 83.235 kg (183 lb 8 oz).   Imaging Review Plain radiographs demonstrate severe degenerative joint disease of the right knee(s). The overall alignment ismild varus. The bone quality appears to be good for age and reported activity level.  Assessment/Plan:  End stage arthritis, right knee   The patient history, physical examination, clinical judgment of the provider and imaging studies are consistent with end stage degenerative joint disease of the right knee(s) and total knee arthroplasty is deemed medically necessary. The treatment options including medical management, injection therapy arthroscopy and arthroplasty were discussed at length. The risks and benefits of total knee arthroplasty were presented and reviewed. The risks due to aseptic loosening,  infection, stiffness, patella tracking problems, thromboembolic complications and other imponderables were discussed. The patient acknowledged the explanation, agreed to proceed with the plan and consent was signed. Patient is being admitted for inpatient treatment for surgery, pain control, PT, OT, prophylactic antibiotics, VTE prophylaxis, progressive ambulation and ADL's and discharge planning. The patient is planning to be discharged home with home health services

## 2013-12-26 NOTE — Progress Notes (Signed)
UR completed. Stashia Sia RN CCM Case Mgmt phone [phone] 

## 2013-12-26 NOTE — Anesthesia Procedure Notes (Addendum)
Anesthesia Regional Block:  Femoral nerve block  Pre-Anesthetic Checklist: ,, timeout performed, Correct Patient, Correct Site, Correct Laterality, Correct Procedure, Correct Position, site marked, Risks and benefits discussed,  Surgical consent,  Pre-op evaluation,  At surgeon's request and post-op pain management  Laterality: Right  Prep: chloraprep       Needles:  Injection technique: Single-shot  Needle Type: Echogenic Stimulator Needle     Needle Length: 9cm 9 cm Needle Gauge: 22 and 22 G    Additional Needles:  Procedures: nerve stimulator Femoral nerve block  Nerve Stimulator or Paresthesia:  Response: 0.48 mA,   Additional Responses:   Narrative:  Start time: 12/26/2013 7:10 AM End time: 12/26/2013 7:19 AM Injection made incrementally with aspirations every 5 mL. Anesthesiologist: Dr Gypsy BalsamKasik  Additional Notes: 4098-1191: 0710-0719 R FNB POP CHG prep, sterile tech Single stick w/out paresthesias Stim down to .48ma with #22 stim/echo needle Multiple neg asp April Hunt .5% w/epi 1:200000 25cc+decadron 4mg  infil No compl Dr Gypsy BalsamKasik   Procedure Name: Intubation Date/Time: 12/26/2013 7:36 AM Performed by: April Hunt, April Hunt Pre-anesthesia Checklist: Patient identified, Emergency Drugs available, Suction available, Patient being monitored and Timeout performed Patient Re-evaluated:Patient Re-evaluated prior to inductionOxygen Delivery Method: Circle system utilized Preoxygenation: Pre-oxygenation with 100% oxygen Intubation Type: IV induction, Cricoid Pressure applied and Rapid sequence Ventilation: Mask ventilation without difficulty Laryngoscope Size: Miller, 2, Mac and 3 Grade View: Grade IV Tube type: Oral Tube size: 7.5 mm Number of attempts: 2 Airway Equipment and Method: Stylet and Video-laryngoscopy Placement Confirmation: ETT inserted through vocal cords under direct vision,  positive ETCO2 and breath sounds checked- equal and bilateral Secured at: 22 cm Tube secured  with: Tape Dental Injury: Teeth and Oropharynx as per pre-operative assessment  Difficulty Due To: Difficult Airway- due to reduced neck mobility, Difficulty was unanticipated and Difficult Airway- due to anterior larynx Future Recommendations: Recommend- induction with short-acting agent, and alternative techniques readily available Comments: Smooth RSI crichoid held initial DL c Mac#3 unable to displace epiglottis, immediately switched to Miller#2 unable to visualize cords pt gently mask ventilated sats maintained at 100% glide scope obtained ett placed under direct vision through VC =BBS +etco2 atraumatic. Pt preop airway eval demonstrated good mouth opening, adequate thyromental distance but limited hyperextention of neck. Lack of visualization due to poor neck extention and anterior cords

## 2013-12-26 NOTE — Op Note (Signed)
NAMAlbertina [place]:  Nale, Valene              ACCOUNT NO.:  [id number]  MEDICAL RECORD NO.:  [id number]  LOCATION:  5N31C                        FACILITY:  [facility]  PHYSICIAN:  Dyke BrackettW. D. Amara Justen, M.D.    DATE OF BIRTH:  23-Jan-1951  DATE OF PROCEDURE:  12/26/2013 DATE OF DISCHARGE:                              OPERATIVE REPORT   INDICATIONS:  A 63 year old, intractable right knee pain, end-stage arthritis of the right knee thought to be amenable to total knee replacement.  PREOPERATIVE DIAGNOSIS:  Osteoarthritis, right knee.  POSTOPERATIVE DIAGNOSIS:  Osteoarthritis, right knee.  OPERATION:  Right total knee replacement (Sigma size 4 femur, size 3 tibia, 10-mm size 4 bearing with 38-mm all poly patella).  SURGEON:  Dyke BrackettW. D. Maury Bamba, M.D.  ASSISTANT:  Margart SicklesJoshua Chadwell, PA-C.  ANESTHESIA:  General with a femoral nerve block.  TOURNIQUET TIME:  64 minutes.  DESCRIPTION OF PROCEDURE:  Sterile prep and drape, exsanguination of leg inflation to 350.  Straight skin incision with medial parapatellar approach to the knee made.  We cut 11 mm with 5-degree valgus, cut off at distal femur followed by cutting about 2-3 mm below the most diseased medial compartment.  Extension gap was measured at 10 mm.  The femur was then sized to be a size 4 followed by placement of the pins in appropriate degree of external rotation for the alignment of the anterior, posterior, and chamfer cuts.  These were accomplished without difficulty.  Excess menisci removed from the posterior aspect of the knee.  PCL was completely released.  Flexion gap equal to extension gap at 10 mm.  Kevin FentonKeyhole was cut for the tibia, followed by trial of tibial base plate. Then, the box cut for the femur, then trialed the femur and tibia with full extension, excellent balance medial and laterally with no tendency for bearing spin out.  The patella was cut leaving 14 mm of native patella for 38 mm all poly patella.  All trials were checked and  deemed to be acceptable with full range of motion and no instability noted.  The final components were inserted, tibia followed by femur, patella, we elected to leave the trial bearing in initially.  Once the cement hardened, removed the trial bearing, checked for any excess cement in the posterior aspect of the knee.  Tourniquet was released.  No excessive bleeding was noted in the posterior aspect of the knee. Small bleeders were coagulated.  Closure was affected with Hemovac drain placed superolaterally with #1 Ethibond, 0 and 2-0 Vicryl, and skin clips.  Light compressive sterile dressing applied.  Taken to recovery room in stable condition.     Dyke BrackettW. D. Atlas Crossland, M.D.     WDC/MEDQ  D:  12/26/2013  T:  12/26/2013  Job:  [id number]

## 2013-12-26 NOTE — Evaluation (Signed)
Physical Therapy Evaluation Patient Details Name: April Hunt MRN: [id number] DOB: [birth year] Today's Date: 12/26/2013   History of Present Illness  Patient is a 63 yo female s/p Rt TKA.  Clinical Impression  Patient presents with problems listed below.  Will benefit from acute PT to maximize independence prior to return home with family.    Follow Up Recommendations Home health PT;Supervision/Assistance - 24 hour    Equipment Recommendations  Rolling walker with 5" wheels;3in1 (PT)    Recommendations for Other Services       Precautions / Restrictions Precautions Precautions: Knee Precaution Booklet Issued: Yes (comment) Precaution Comments: Reviewed precautions with patient  Required Braces or Orthoses: Knee Immobilizer - Right Knee Immobilizer - Right: On when out of bed or walking Restrictions Weight Bearing Restrictions: Yes RLE Weight Bearing: Weight bearing as tolerated      Mobility  Bed Mobility Overal bed mobility: Needs Assistance Bed Mobility: Supine to Sit;Sit to Supine     Supine to sit: Min assist Sit to supine: Mod assist   General bed mobility comments: Instructions on use of KI on RLE.  Verbal cues for technique for bed mobility.  Assist to move RLE off of bed and to raise trunk to sitting.  Once upright, patient able to maintain balance with min guard assist.  Patient sat EOB x 6 minutes and became nauseated.  Returned to supine with mod assist.  Transfers                    Ambulation/Gait                Stairs            Wheelchair Mobility    Modified Rankin (Stroke Patients Only)       Balance                                             Pertinent Vitals/Pain Pain 10/10 with mobility, and patient nauseated.    Home Living Family/patient expects to be discharged to:: Private residence Living Arrangements: Parent Available Help at Discharge: Family;Available 24 hours/day (mother) Type of  Home: House Home Access: Level entry     Home Layout: Two level;Able to live on main level with bedroom/bathroom (1 step down to patient's room; mother upstairs) Home Equipment: None      Prior Function Level of Independence: Independent               Hand Dominance        Extremity/Trunk Assessment   Upper Extremity Assessment: Overall WFL for tasks assessed           Lower Extremity Assessment: RLE deficits/detail RLE Deficits / Details: Decreased strength and ROM due to surgery/pain.  Able to assist with bringing RLE off of bed.    Cervical / Trunk Assessment: Normal  Communication   Communication: No difficulties  Cognition Arousal/Alertness: Lethargic;Suspect due to medications Behavior During Therapy: [facility] for tasks assessed/performed Overall Cognitive Status: Within Functional Limits for tasks assessed                      General Comments      Exercises Total Joint Exercises Ankle Circles/Pumps: AROM;Both;10 reps;Supine      Assessment/Plan    PT Assessment Patient needs continued PT services  PT Diagnosis Difficulty walking;Acute pain  PT Problem List Decreased strength;Decreased range of motion;Decreased activity tolerance;Decreased balance;Decreased mobility;Decreased knowledge of use of DME;Decreased knowledge of precautions;Pain  PT Treatment Interventions DME instruction;Gait training;Functional mobility training;Stair training;Therapeutic exercise;Patient/family education   PT Goals (Current goals can be found in the Care Plan section) Acute Rehab PT Goals Patient Stated Goal: To decrease pain PT Goal Formulation: With patient/family Time For Goal Achievement: 01/02/14 Potential to Achieve Goals: Good    Frequency 7X/week   Barriers to discharge        Co-evaluation               End of Session Equipment Utilized During Treatment: Right knee immobilizer Activity Tolerance: Patient limited by lethargy;Patient  limited by pain Patient left: in bed;with call bell/phone within reach;with bed alarm set;with family/visitor present Nurse Communication: Mobility status (Nausea )         Time: [id number] PT Time Calculation (min): 19 min   Charges:   PT Evaluation $Initial PT Evaluation Tier I: 1 Procedure PT Treatments $Therapeutic Activity: 8-22 mins   PT G Codes:          Vena AustriaSusan H Ryann Pauli 12/26/2013, 5:52 PM Durenda HurtSusan H. Renaldo Fiddleravis, PT, [facility] Acute Rehab Services Pager [phone]

## 2013-12-26 NOTE — Anesthesia Postprocedure Evaluation (Signed)
  Anesthesia Post-op Note  Patient: April Hunt  Procedure(s) Performed: Procedure(s): RIGHT TOTAL KNEE ARTHROPLASTY (Right)  Patient Location: PACU  Anesthesia Type:GA combined with regional for post-op pain  Level of Consciousness: awake  Airway and Oxygen Therapy: Patient Spontanous Breathing  Post-op Pain: mild  Post-op Assessment: Post-op Vital signs reviewed, Patient's Cardiovascular Status Stable, Respiratory Function Stable, Patent Airway, No signs of Nausea or vomiting and Pain level controlled  Post-op Vital Signs: Reviewed and stable  Last Vitals:  Filed Vitals:   12/26/13 1030  BP: 148/89  Pulse: 66  Temp:   Resp: 15    Complications: No apparent anesthesia complications

## 2013-12-26 NOTE — Anesthesia Preprocedure Evaluation (Signed)
Anesthesia Evaluation  Patient identified by MRN, date of birth, ID band Patient awake    Reviewed: Allergy & Precautions, H&P , NPO status , Patient's Chart, lab work & pertinent test results  History of Anesthesia Complications (+) PONV and history of anesthetic complications  Airway Mallampati: II TM Distance: >3 FB Neck ROM: Full    Dental  (+) Teeth Intact, Dental Advisory Given   Pulmonary asthma ,  breath sounds clear to auscultation  Pulmonary exam normal       Cardiovascular hypertension, negative cardio ROS  Rhythm:Regular Rate:Normal     Neuro/Psych  Headaches, Chronic back pain: narcotics    GI/Hepatic Neg liver ROS, GERD-  Poorly Controlled,  Endo/Other  negative endocrine ROS  Renal/GU negative Renal ROS     Musculoskeletal   Abdominal (+) + obese,   Peds  Hematology  (+) REFUSES BLOOD PRODUCTS, JEHOVAH'S WITNESS  Anesthesia Other Findings   Reproductive/Obstetrics                           Anesthesia Physical Anesthesia Plan  ASA: II  Anesthesia Plan: General   Post-op Pain Management:    Induction: Intravenous  Airway Management Planned: Oral ETT  Additional Equipment:   Intra-op Plan:   Post-operative Plan: Extubation in OR  Informed Consent: I have reviewed the patients History and Physical, chart, labs and discussed the procedure including the risks, benefits and alternatives for the proposed anesthesia with the patient or authorized representative who has indicated his/her understanding and acceptance.     Plan Discussed with: CRNA and Surgeon  Anesthesia Plan Comments:         Anesthesia Quick Evaluation

## 2013-12-26 NOTE — Transfer of Care (Signed)
Immediate Anesthesia Transfer of Care Note  Patient: April Hunt  Procedure(s) Performed: Procedure(s): RIGHT TOTAL KNEE ARTHROPLASTY (Right)  Patient Location: PACU  Anesthesia Type:General and GA combined with regional for post-op pain  Level of Consciousness: awake, alert , oriented and patient cooperative  Airway & Oxygen Therapy: Patient Spontanous Breathing and Patient connected to nasal cannula oxygen  Post-op Assessment: Report given to PACU RN, Post -op Vital signs reviewed and stable and Patient moving all extremities  Post vital signs: Reviewed and stable  Complications: No apparent anesthesia complications

## 2013-12-27 LAB — BASIC METABOLIC PANEL
BUN: 10 mg/dL (ref 6–23)
CO2: 22 mEq/L (ref 19–32)
Calcium: 10 mg/dL (ref 8.4–10.5)
Chloride: 98 mEq/L (ref 96–112)
Creatinine, Ser: 0.74 mg/dL (ref 0.50–1.10)
GFR calc Af Amer: >90 mL/min (ref >90)
GFR calc non Af Amer: >90 mL/min (ref >90)
GLUCOSE: 129 mg/dL — AB (ref 70–99)
POTASSIUM: 4.3 meq/L (ref 3.7–5.3)
SODIUM: 138 meq/L (ref 137–147)

## 2013-12-27 LAB — CBC
HEMATOCRIT: 33.2 % — AB (ref 36.0–46.0)
HEMOGLOBIN: 11.4 g/dL — AB (ref 12.0–15.0)
MCH: 30.5 pg (ref 26.0–34.0)
MCHC: 34.3 g/dL (ref 30.0–36.0)
MCV: 88.8 fL (ref 78.0–100.0)
Platelets: 228 10*3/uL (ref 150–400)
RBC: 3.74 MIL/uL — ABNORMAL LOW (ref 3.87–5.11)
RDW: 13 % (ref 11.5–15.5)
WBC: 11.8 10*3/uL — ABNORMAL HIGH (ref 4.0–10.5)

## 2013-12-27 NOTE — Plan of Care (Signed)
Problem: Consults Goal: Diagnosis- Total Joint Replacement Outcome: Completed/Met Date Met:  12/27/13 Primary Total Knee Right

## 2013-12-27 NOTE — Progress Notes (Signed)
Physical Therapy Treatment Patient Details Name: Nathanial RancherBermell J Solano MRN: [id number] DOB: 04/14/1951 Today's Date: 12/27/2013    History of Present Illness Patient is a 63 yo female s/p Rt TKA.    PT Comments    Pt continues to move well and progress mobility despite rating her current knee pain as 10/10. Pt premedicated.  Follow Up Recommendations  Home health PT;Supervision/Assistance - 24 hour     Equipment Recommendations  Rolling walker with 5" wheels;3in1 (PT)    Precautions / Restrictions Precautions Precautions: Knee Precaution Comments: Reviewed precautions with pt- no pillow under knee. Required Braces or Orthoses: Knee Immobilizer - Right Knee Immobilizer - Right: On when out of bed or walking Restrictions Weight Bearing Restrictions: Yes RLE Weight Bearing: Weight bearing as tolerated    Mobility  Bed Mobility    Supine to sit: Min assist Sit to supine: Min assist   General bed mobility comments: bed flat and no rails used. min assist to clear bed surface with right leg. then educated pt in use of belt to move right leg off/on bed with pt return demo of technique.  Transfers Overall transfer level: Needs assistance Equipment used: Rolling walker (2 wheeled) Transfers: Sit to/from Stand Sit to Stand: Supervision         General transfer comment: to sit to toilet and bed and stand from recliner and toilet. min cues on hand and right leg placement with transfers.  Ambulation/Gait Ambulation/Gait assistance: Supervision Ambulation Distance (Feet): 40 Feet Assistive device: Rolling walker (2 wheeled) Gait Pattern/deviations: Step-to pattern;Decreased step length - left;Decreased stance time - right;Antalgic Gait velocity: decreased Gait velocity interpretation: Below normal speed for age/gender General Gait Details: cues for sequence. walker position with gait and on posture with gait. no knee buckling noted with KI on.      Cognition Arousal/Alertness:  Awake/alert Behavior During Therapy: WFL for tasks assessed/performed Overall Cognitive Status: Within Functional Limits for tasks assessed        Exercises Total Joint Exercises Ankle Circles/Pumps: AROM;Both;10 reps;Supine Quad Sets: AROM;Strengthening;Right;10 reps;Supine Heel Slides: AAROM;Strengthening;Right;10 reps;Supine Hip ABduction/ADduction: AAROM;Strengthening;Right;10 reps;Supine Straight Leg Raises: AAROM;Strengthening;Right;10 reps;Supine      Home Living Family/patient expects to be discharged to:: Private residence Living Arrangements: Parent Available Help at Discharge: Family;Available 24 hours/day (mother) Type of Home: House Home Access: Level entry   Home Layout: Two level;Able to live on main level with bedroom/bathroom (1 step down to patient's room; shower upstairs) Home Equipment: Bedside commode;Walker - 2 wheels      Prior Function Level of Independence: Independent          PT Goals (current goals can now be found in the care plan section) Acute Rehab PT Goals Patient Stated Goal: not stated PT Goal Formulation: With patient/family Time For Goal Achievement: 01/02/14 Potential to Achieve Goals: Good Progress towards PT goals: Progressing toward goals    Frequency  7X/week    PT Plan Current plan remains appropriate       End of Session Equipment Utilized During Treatment: Gait belt;Right knee immobilizer Activity Tolerance: Patient tolerated treatment well Patient left: in chair;with call bell/phone within reach     Time: [id number] PT Time Calculation (min): 26 min  Charges:  $Gait Training: 8-22 mins $Therapeutic Exercise: 8-22 mins                    G Codes:      Sallyanne KusterKathy Ashlye Oviedo 12/27/2013, 2:58 PM  Sallyanne KusterKathy Margrett Kalb, PTA Office- [phone]

## 2013-12-27 NOTE — Progress Notes (Signed)
Orthopaedic Trauma Service (OTS)  Subjective: 1 Day Post-Op Procedure(s) (LRB): RIGHT TOTAL KNEE ARTHROPLASTY (Right) Patient reports pain as moderate.   N/V post op prevented working with PT earlier today, but plans to resume this pm.  Objective: Current Vitals Blood pressure 152/58, pulse 84, temperature 98.7 F (37.1 C), temperature source Oral, resp. rate 18, SpO2 98.00%. Vital signs in last 24 hours: Temp:  [97.1 F (36.2 C)-98.7 F (37.1 C)] 98.7 F (37.1 C) (04/11 0615) Pulse Rate:  [59-84] 84 (04/11 0615) Resp:  [11-18] 18 (04/11 0615) BP: (134-167)/(58-89) 152/58 mmHg (04/11 0615) SpO2:  [96 %-100 %] 98 % (04/11 0615)  Intake/Output from previous day: 04/10 0701 - 04/11 0700 In: 1975 [P.O.:300; I.V.:1675] Out: 1200 [Urine:900; Drains:300]  LABS  Recent Labs  12/26/13 1540 12/27/13 0625  HGB 12.6 11.4*    Recent Labs  12/26/13 1540 12/27/13 0625  WBC 12.3* 11.8*  RBC 4.16 3.74*  HCT 37.2 33.2*  PLT 237 228    Recent Labs  12/26/13 1540 12/27/13 0625  NA  --  138  K  --  4.3  CL  --  98  CO2  --  22  BUN  --  10  CREATININE 0.75 0.74  GLUCOSE  --  129*  CALCIUM  --  10.0   No results found for this basename: LABPT, INR,  in the last 72 hours  Physical Exam  RLE Drsg clean, dry, intact  Drain in place and functional  Swelling well controlled  Intact sens DPN, SPN, TN  F/E great and lesser toes intact  DP2+   Imaging No results found.  Assessment/Plan: 1 Day Post-Op Procedure(s) (LRB): RIGHT TOTAL KNEE ARTHROPLASTY (Right) Labs stable Pillows placed under heel and encouraged extension PT/OT Lovenox D/c drain and change drsg tom  Myrene GalasMichael Kennard Fildes, MD [facility] [phone] (p)   12/27/2013, 10:01 AM

## 2013-12-27 NOTE — Evaluation (Signed)
Occupational Therapy Evaluation Patient Details Name: April Hunt MRN: [id number] DOB: 11/19/1950 Today's Date: 12/27/2013    History of Present Illness Patient is a 63 yo female s/p Rt TKA.   Clinical Impression   Pt s/p above procedure. Pt independent with ADLs, PTA. Feel pt will benefit from acute OT to increase independence prior to d/c.     Follow Up Recommendations  No OT follow up;Supervision - Intermittent (when OOB/mobility)    Equipment Recommendations  Other (comment) (may need AE)    Recommendations for Other Services       Precautions / Restrictions Precautions Precautions: Knee Precaution Comments: Reviewed precautions with pt Required Braces or Orthoses: Knee Immobilizer - Right Knee Immobilizer - Right: On when out of bed or walking Restrictions Weight Bearing Restrictions: Yes RLE Weight Bearing: Weight bearing as tolerated      Mobility Bed Mobility                  Transfers Overall transfer level: Needs assistance Equipment used: Rolling walker (2 wheeled) Transfers: Sit to/from Stand Sit to Stand: Min guard         General transfer comment: Cues for technique.    Balance                                            ADL Overall ADL's : Needs assistance/impaired     Grooming: Wash/dry hands;Supervision/safety;Standing;Set up (pt would be setup/supervision for overall grooming tasks)           Upper Body Dressing : Set up;Sitting   Lower Body Dressing: Minimal assistance;Sit to/from stand;With adaptive equipment   Toilet Transfer: Min guard;Ambulation;RW (3 in 1 over commode)   Toileting- Clothing Manipulation and Hygiene: Min guard;Sit to/from stand       Functional mobility during ADLs: Min guard;Rolling walker General ADL Comments: Educated on use of bag on walker to carry items. Educated on dressing technique. Explained benefit of reaching to don/doff right sock as it increases ROM in knee. Pt  practiced with reacher and sockaid. Educated on safety tips during session.      Vision                     Perception     Praxis      Pertinent Vitals/Pain Pain 8/10. Nurse notified.      Hand Dominance     Extremity/Trunk Assessment Upper Extremity Assessment Upper Extremity Assessment: Overall WFL for tasks assessed   Lower Extremity Assessment Lower Extremity Assessment: Defer to PT evaluation       Communication Communication Communication: No difficulties   Cognition Arousal/Alertness: Awake/alert Behavior During Therapy: WFL for tasks assessed/performed Overall Cognitive Status: Within Functional Limits for tasks assessed                     General Comments       Exercises       Shoulder Instructions      Home Living Family/patient expects to be discharged to:: Private residence Living Arrangements: Parent Available Help at Discharge: Family;Available 24 hours/day (mother) Type of Home: House Home Access: Level entry     Home Layout: Two level;Able to live on main level with bedroom/bathroom (1 step down to patient's room; shower upstairs)     Bathroom Shower/Tub: Walk-in shower;Tub/shower unit   Bathroom Toilet: Handicapped height  Home Equipment: Bedside commode;Walker - 2 wheels          Prior Functioning/Environment Level of Independence: Independent             OT Diagnosis: Acute pain   OT Problem List: Decreased strength;Decreased range of motion;Decreased knowledge of use of DME or AE;Decreased knowledge of precautions;Pain   OT Treatment/Interventions: Self-care/ADL training;DME and/or AE instruction;Therapeutic activities;Patient/family education;Balance training    OT Goals(Current goals can be found in the care plan section) Acute Rehab OT Goals Patient Stated Goal: not stated OT Goal Formulation: With patient Time For Goal Achievement: 01/03/14 Potential to Achieve Goals: Good ADL Goals Pt Will  Perform Lower Body Dressing: with modified independence;sit to/from stand Pt Will Transfer to Toilet: with modified independence;ambulating (3 in 1 over commode)  OT Frequency: Min 2X/week   Barriers to D/C:            Co-evaluation              End of Session Equipment Utilized During Treatment: Gait belt;Rolling walker;Right knee immobilizer CPM Right Knee CPM Right Knee: Off Nurse Communication: Other (comment);Mobility status (pain level)  Activity Tolerance: Patient tolerated treatment well Patient left: in chair;with call bell/phone within reach   Time: 1225-1248 OT Time Calculation (min): 23 min Charges:  OT General Charges $OT Visit: 1 Procedure OT Evaluation $Initial OT Evaluation Tier I: 1 Procedure OT Treatments $Self Care/Home Management : 8-22 mins G-Codes:    Earlie Raveling OTR/L [phone] 12/27/2013, 1:19 PM

## 2013-12-27 NOTE — Progress Notes (Signed)
Physical Therapy Treatment Patient Details Name: April Hunt J Coulibaly MRN: [id number] DOB: 07/15/1951 Today's Date: 12/27/2013    History of Present Illness Patient is a 63 yo female s/p Rt TKA.    PT Comments    Pt making great progress with mobility today.  Follow Up Recommendations  Home health PT;Supervision/Assistance - 24 hour     Equipment Recommendations  Rolling walker with 5" wheels;3in1 (PT)    Precautions / Restrictions Precautions Precautions: Knee Precaution Comments: Reviewed precautions with pt- no pillow under knee. Required Braces or Orthoses: Knee Immobilizer - Right Knee Immobilizer - Right: On when out of bed or walking Restrictions Weight Bearing Restrictions: Yes RLE Weight Bearing: Weight bearing as tolerated    Mobility  Supine to sit: Min assist     General bed mobility comments: bed flat and rail used: min assist for right leg management. cues on techique/sequence with going from supine to sit. pt needed supervision to scoot to edge of bed.  Transfers Overall transfer level: Needs assistance Equipment used: Rolling walker (2 wheeled) Transfers: Sit to/from Stand Sit to Stand: Min guard         General transfer comment: cues for right leg/hand placement with transfers from/to toilet and recliner.   Ambulation/Gait Ambulation/Gait assistance: Min guard Ambulation Distance (Feet): 30 Feet Assistive device: Rolling walker (2 wheeled) Gait Pattern/deviations: Step-to pattern;Decreased stance time - right;Decreased step length - left;Antalgic Gait velocity: decreased Gait velocity interpretation: Below normal speed for age/gender General Gait Details: cues for sequence. walker position with gait and on posture with gait. no knee buckling noted with KI on.        Cognition Arousal/Alertness: Awake/alert Behavior During Therapy: WFL for tasks assessed/performed Overall Cognitive Status: Within Functional Limits for tasks assessed        Exercises Total Joint Exercises Ankle Circles/Pumps: AROM;Both;10 reps;Seated Quad Sets: AROM;Strengthening;Right;10 reps;Seated Heel Slides: AAROM;Strengthening;Right;10 reps;Seated Hip ABduction/ADduction: AAROM;Strengthening;Right;10 reps;Seated       Home Living Family/patient expects to be discharged to:: Private residence Living Arrangements: Parent Available Help at Discharge: Family;Available 24 hours/day (mother) Type of Home: House Home Access: Level entry   Home Layout: Two level;Able to live on main level with bedroom/bathroom (1 step down to patient's room; shower upstairs) Home Equipment: Bedside commode;Walker - 2 wheels      Prior Function Level of Independence: Independent       PT Goals (current goals can now be found in the care plan section) Acute Rehab PT Goals Patient Stated Goal: not stated PT Goal Formulation: With patient/family Time For Goal Achievement: 01/02/14 Potential to Achieve Goals: Good Progress towards PT goals: Progressing toward goals    Frequency  7X/week    PT Plan Current plan remains appropriate    End of Session Equipment Utilized During Treatment: Gait belt;Right knee immobilizer Activity Tolerance: Patient tolerated treatment well Patient left: in chair;with call bell/phone within reach;with nursing/sitter in room     Time: 4098-11911046-1115 PT Time Calculation (min): 29 min  Charges:  $Gait Training: 8-22 mins $Therapeutic Exercise: 8-22 mins                    G Codes:      Sallyanne KusterKathy Bury 12/27/2013, 1:28 PM  Sallyanne KusterKathy Bury, PTA Office- [phone]

## 2013-12-28 LAB — CBC
HEMATOCRIT: 34.8 % — AB (ref 36.0–46.0)
HEMOGLOBIN: 11.8 g/dL — AB (ref 12.0–15.0)
MCH: 30.3 pg (ref 26.0–34.0)
MCHC: 33.9 g/dL (ref 30.0–36.0)
MCV: 89.5 fL (ref 78.0–100.0)
Platelets: 226 10*3/uL (ref 150–400)
RBC: 3.89 MIL/uL (ref 3.87–5.11)
RDW: 13.2 % (ref 11.5–15.5)
WBC: 14.4 10*3/uL — ABNORMAL HIGH (ref 4.0–10.5)

## 2013-12-28 NOTE — Progress Notes (Signed)
Orthopedic Tech Progress Note Patient Details:  April Hunt 09/04/1951 [id number]  Patient ID: April Hunt, female   DOB: 06/05/1951, 63 y.o.   MRN: [id number] Placed pt's rle in cpm @ 0-65 degrees @ 1525  April Hunt 12/28/2013, 3:26 PM

## 2013-12-28 NOTE — Progress Notes (Signed)
Physical Therapy Treatment Patient Details Name: LASHEENA FRIEZE MRN: [id number] DOB: [birth year] Today's Date: 12/28/2013    History of Present Illness Patient is a 63 yo female s/p Rt TKA.    PT Comments    Continuing progress despite more pain this PM; Stair training complete, although pt may opt to proactice again with mother tomorrow before dc  Follow Up Recommendations  Home health PT;Supervision/Assistance - 24 hour     Equipment Recommendations  Rolling walker with 5" wheels;3in1 (PT)    Recommendations for Other Services       Precautions / Restrictions Precautions Precautions: Knee Precaution Comments: Reviewed precautions with pt Required Braces or Orthoses: Knee Immobilizer - Right Knee Immobilizer - Right: On when out of bed or walking Restrictions RLE Weight Bearing: Weight bearing as tolerated    Mobility  Bed Mobility Overal bed mobility: Needs Assistance Bed Mobility: Supine to Sit;Sit to Supine     Supine to sit: Min assist Sit to supine: Min assist   General bed mobility comments: More painful this afternoon, so pt requested assist for RLE  Transfers Overall transfer level: Needs assistance Equipment used: Rolling walker (2 wheeled) Transfers: Sit to/from Stand Sit to Stand: Supervision         General transfer comment: cues to reinforce technique.  Ambulation/Gait Ambulation/Gait assistance: Supervision Ambulation Distance (Feet): 20 Feet Assistive device: Rolling walker (2 wheeled) Gait Pattern/deviations: Step-to pattern Gait velocity: decreased   General Gait Details: cues for posture and to work towards reciprocal gait pattern   Stairs Stairs: Yes Stairs assistance: Min guard Stair Management: No rails;Backwards;One rail Right;With cane;Forwards;Step to pattern Number of Stairs: 2 (x2) General stair comments: Verbal and demo cues for technique; Worked on no rails backwards with RW, and one rail L with cane; Painful, but  managing well  Wheelchair Mobility    Modified Rankin (Stroke Patients Only)       Balance                                    Cognition Arousal/Alertness: Awake/alert Behavior During Therapy: WFL for tasks assessed/performed Overall Cognitive Status: Within Functional Limits for tasks assessed                      Exercises      General Comments        Pertinent Vitals/Pain 8/10 L knee patient repositioned for comfort Was premedicated for pain     Home Living                      Prior Function            PT Goals (current goals can now be found in the care plan section) Acute Rehab PT Goals Patient Stated Goal: not stated PT Goal Formulation: With patient/family Time For Goal Achievement: 01/02/14 Potential to Achieve Goals: Good Progress towards PT goals: Progressing toward goals    Frequency  7X/week    PT Plan Current plan remains appropriate    Co-evaluation             End of Session Equipment Utilized During Treatment: Right knee immobilizer Activity Tolerance: Patient tolerated treatment well Patient left: in bed;with call bell/phone within reach     Time: 4098-1191 PT Time Calculation (min): 26 min  Charges:  $Gait Training: 23-37 mins  G Codes:      [facility] Hamff DukedomGarrigan 12/28/2013, 5:18 PM  Van ClinesHolly Katsumi Wisler, PT  Acute Rehabilitation Services Pager [phone] Office [phone]'

## 2013-12-28 NOTE — Progress Notes (Signed)
Orthopaedic Trauma Service Progress Note  Subjective  Doing well Sitting in chair Pain improved No additional complaints   Objective   BP 153/85  Pulse 80  Temp(Src) 98.5 F (36.9 C) (Oral)  Resp 16  Ht 5\' 5"  (1.651 m)  Wt 86.183 kg (190 lb)  BMI 31.62 kg/m2  SpO2 95%  Intake/Output     04/11 0701 - 04/12 0700 04/12 0701 - 04/13 0700   P.O. 960    I.V. (mL/kg) 1387.5 (16.1)    Total Intake(mL/kg) 2347.5 (27.2)    Urine (mL/kg/hr)     Drains 75 (0)    Total Output 75     Net +2272.5          Urine Occurrence 4 x      Labs  Results for April Hunt, April Hunt (MRN [id number]) as of 12/28/2013 11:30  Ref. Range 12/28/2013 06:00  WBC Latest Range: 4.0-10.5 K/uL 14.4 (H)  RBC Latest Range: 3.87-5.11 MIL/uL 3.89  Hemoglobin Latest Range: 12.0-15.0 g/dL 36.611.8 (L)  HCT Latest Range: 36.0-46.0 % 34.8 (L)  MCV Latest Range: 78.0-100.0 fL 89.5  MCH Latest Range: 26.0-34.0 pg 30.3  MCHC Latest Range: 30.0-36.0 g/dL 44.033.9  RDW Latest Range: 11.5-15.5 % 13.2  Platelets Latest Range: 150-400 K/uL 226    Exam  Gen: awake and alert, NAD, comfortable Ext:      Right Lower Extremity    Incision looks excellent   No drainage   No signs of infection   Drain removed   Distal motor and sensory functions intact   Ext warm   Swelling well controlled   No DCT    Assessment and Plan   POD/HD#: 2  63 y/o female s/p R TKA  1. R TKA   WBAT  Dressing changed, can change PRN  Will get TED hose  Ice prn  Elevate  No pillows under knee at rest  CPM   PT/OT   2. Medical issues  Continue home meds     3. Pain management:  Continue current pain regimen  4. ABL anemia/Hemodynamics  Stable   5. DVT/PE prophylaxis:  Lovenox   6. FEN/Foley/Lines:  Diet as tolerated  NSL  Dc IVF  7. Dispo:  Continue with therapies  Dc tomorrow     Mearl LatinKeith W. Enrigue Hashimi, PA-C [facility] [phone] (P) 12/28/2013 11:29 AM

## 2013-12-28 NOTE — Progress Notes (Addendum)
Occupational Therapy Treatment Patient Details Name: April Hunt J Biskup MRN: [id number] DOB: 11/07/1950 Today's Date: 12/28/2013    History of present illness Patient is a 63 yo female s/p Rt TKA.   OT comments  Education provided during session. Pt practiced shower transfer, LB dressing, toileting, and grooming.   Follow Up Recommendations  No OT follow up;Supervision - Intermittent (when OOB/mobility)    Equipment Recommendations  None recommended by OT    Recommendations for Other Services      Precautions / Restrictions Precautions Precautions: Knee Precaution Comments: Reviewed precautions with pt Required Braces or Orthoses: Knee Immobilizer - Right Knee Immobilizer - Right: On when out of bed or walking Restrictions Weight Bearing Restrictions: Yes RLE Weight Bearing: Weight bearing as tolerated       Mobility Bed Mobility Overal bed mobility:  (pt sitting in chair)       Supine to sit: Supervision     General bed mobility comments: bed flat and no rails used. pt used belt to advance right leg off bed and then supervision to scoot to edge of bed.  Transfers Overall transfer level: Needs assistance Equipment used: Rolling walker (2 wheeled) Transfers: Sit to/from Stand Sit to Stand: Supervision         General transfer comment: cues to reinforce technique.    Balance                                   ADL Overall ADL's : Needs assistance/impaired     Grooming: Wash/dry hands;Supervision/safety;Standing               Lower Body Dressing: Sitting/lateral leans;Minimal assistance (sock)   Toilet Transfer: Supervision/safety;Ambulation (3 in 1 over commode)   Toileting- Clothing Manipulation and Hygiene: Min guard;Sit to/from stand   Tub/ Engineer, structuralhower Transfer: Walk-in shower;Ambulation;Rolling walker;Min guard   Functional mobility during ADLs: Supervision/safety;Rolling walker General ADL Comments: Explained benefit of reaching down  to right sock as it increases ROM in knee. Practiced shower transfer technique-pt unsure if walker will fit with shower chair. Reviewed use of bag on walker.  Pt practiced with reacher to doff sock-OT assisted pt in donning right sock (pt says her mom will help her with this).      Vision                     Perception     Praxis      Cognition   Behavior During Therapy: [facility], IncWFL for tasks assessed/performed Overall Cognitive Status: Within Functional Limits for tasks assessed                       Extremity/Trunk Assessment                  Shoulder Instructions       General Comments      Pertinent Vitals/ Pain       Pain 8/10. Increased activity during session and repositioned.  Home Living                                          Prior Functioning/Environment              Frequency Min 2X/week     Progress Toward Goals  OT Goals(current goals can now be found in  the care plan section)  Progress towards OT goals: Progressing toward goals  Acute Rehab OT Goals Patient Stated Goal: not stated OT Goal Formulation: With patient Time For Goal Achievement: 01/03/14 Potential to Achieve Goals: Good ADL Goals Pt Will Perform Lower Body Dressing: with modified independence;sit to/from stand Pt Will Transfer to Toilet: with modified independence;ambulating (3 in 1 over commode)  Plan Discharge plan remains appropriate    Co-evaluation                 End of Session Equipment Utilized During Treatment: Gait belt;Rolling walker;Right knee immobilizer   Activity Tolerance Patient tolerated treatment well   Patient Left in chair;with call bell/phone within reach   Nurse Communication          Time: 2956-2130 OT Time Calculation (min): 15 min  Charges: OT General Charges $OT Visit: 1 Procedure OT Treatments $Self Care/Home Management : 8-22 mins  Earlie Raveling OTR/L [phone] 12/28/2013, 11:16 AM

## 2013-12-28 NOTE — Progress Notes (Signed)
Physical Therapy Treatment Patient Details Name: April RancherBermell J Hunt MRN: [id number] DOB: 12/24/1950 Today's Date: 12/28/2013    History of Present Illness Patient is a 63 yo female s/p Rt TKA.    PT Comments    Pt making great progress with mobility despite still reporting increased pain levels. Increased activity today with less assistance needed. Will plan on stair education at next session as pt's pain allows.  Follow Up Recommendations  Home health PT;Supervision/Assistance - 24 hour     Equipment Recommendations  Rolling walker with 5" wheels;3in1 (PT)    Precautions / Restrictions Precautions Precautions: Knee Required Braces or Orthoses: Knee Immobilizer - Right Knee Immobilizer - Right: On when out of bed or walking Restrictions RLE Weight Bearing: Weight bearing as tolerated    Mobility  Bed Mobility   Supine to sit: Supervision     General bed mobility comments: bed flat and no rails used. pt used belt to advance right leg off bed and then supervision to scoot to edge of bed.  Transfers   Equipment used: Rolling walker (2 wheeled) Transfers: Sit to/from Stand Sit to Stand: Supervision         General transfer comment: no cues needed, demo'd safe technique.  Ambulation/Gait Ambulation/Gait assistance: Supervision Ambulation Distance (Feet): 100 Feet Assistive device: Rolling walker (2 wheeled) Gait Pattern/deviations: Step-to pattern;Antalgic;Decreased stance time - right;Decreased step length - left Gait velocity: decreased Gait velocity interpretation: Below normal speed for age/gender General Gait Details: cues for posture and to work towards reciprocal gait pattern        Cognition Arousal/Alertness: Awake/alert Behavior During Therapy: WFL for tasks assessed/performed Overall Cognitive Status: Within Functional Limits for tasks assessed        Exercises Total Joint Exercises Ankle Circles/Pumps: AROM;Both;10 reps;Supine Quad Sets:  AROM;Strengthening;Right;10 reps;Supine Heel Slides: AAROM;Strengthening;Right;10 reps;Supine;Limitations Heel Slides Limitations: pt left with belt to work on knee flexion while in recliner and educated to do this through out day to help improve active/AA knee flexion post surgery Hip ABduction/ADduction: AAROM;Strengthening;Right;10 reps;Supine Straight Leg Raises: AAROM;Strengthening;Right;10 reps;Supine Goniometric ROM: supine AAROM with belt: 40 degrees flexion with increased time/contract relax to achieve this motion     Pertinent Vitals/Pain Reports pain as 8/10. Premedicated around 1 hour ago by [employer].     PT Goals (current goals can now be found in the care plan section) Acute Rehab PT Goals Patient Stated Goal: not stated PT Goal Formulation: With patient/family Time For Goal Achievement: 01/02/14 Potential to Achieve Goals: Good Progress towards PT goals: Progressing toward goals    Frequency  7X/week    PT Plan Current plan remains appropriate    End of Session Equipment Utilized During Treatment: Gait belt;Right knee immobilizer Activity Tolerance: Patient tolerated treatment well Patient left: in chair;with call bell/phone within reach     Time: 0857-0930 PT Time Calculation (min): 33 min  Charges:  $Gait Training: 8-22 mins $Therapeutic Exercise: 8-22 mins                    G Codes:      Sallyanne KusterKathy Nyzier Boivin 12/28/2013, 9:39 AM

## 2013-12-29 ENCOUNTER — Encounter (HOSPITAL_COMMUNITY): Payer: Self-pay | Admitting: Orthopedic Surgery

## 2013-12-29 LAB — CBC
HEMATOCRIT: 36.3 % (ref 36.0–46.0)
Hemoglobin: 12.5 g/dL (ref 12.0–15.0)
MCH: 31 pg (ref 26.0–34.0)
MCHC: 34.4 g/dL (ref 30.0–36.0)
MCV: 90.1 fL (ref 78.0–100.0)
PLATELETS: 233 10*3/uL (ref 150–400)
RBC: 4.03 MIL/uL (ref 3.87–5.11)
RDW: 13.2 % (ref 11.5–15.5)
WBC: 10.8 10*3/uL — ABNORMAL HIGH (ref 4.0–10.5)

## 2013-12-29 NOTE — Care Management Note (Signed)
CARE MANAGEMENT NOTE 12/29/2013  Patient:  April Hunt,April Hunt   Account Number:  [id number]  Date Initiated:  12/26/2013  Documentation initiated by:  [facility] Surg[facility] CenterHAVIS,ALESIA  Subjective/Objective Assessment:   right total knee arthroplasty     [facility]   Anticipated DC Date:  12/29/2013   Anticipated DC Plan:  HOME W HOME HEALTH SERVICES      DC Planning Services  CM consult      [facility]  [facility]  DURABLE MEDICAL EQUIPMENT   Choice offered to / List presented to:  C-1 Patient   DME arranged  [facility]  3-N-1      DME agency  [employer]     HH arranged  HH-1 RN  HH-2 PT      HH agency  [facility]   Status of service:  Completed, signed off Medicare Important Message given?   (If response is "NO", the following Medicare IM given date fields will be blank) Date Medicare IM given:   Date Additional Medicare IM given:    Discharge Disposition:  HOME W HOME HEALTH SERVICES

## 2013-12-29 NOTE — Progress Notes (Signed)
Physical Therapy Treatment Patient Details Name: April RancherBermell J Hunt MRN: [id number] DOB: 02/16/1951 Today's Date: 12/29/2013    History of Present Illness Patient is a 63 yo female s/p Rt TKA.    PT Comments    PT session focused on reinforcing safe mobility for safe dc home and giving pt instruction for her therex and to independently pregoress her ROM and strengthening of that knee; OK for dc home from PT standpoint   Follow Up Recommendations  Home health PT;Supervision/Assistance - 24 hour     Equipment Recommendations  Rolling walker with 5" wheels;3in1 (PT)    Recommendations for Other Services       Precautions / Restrictions Precautions Precautions: Knee Precaution Comments: Reviewed precautions with pt Required Braces or Orthoses: Knee Immobilizer - Right Knee Immobilizer - Right: On when out of bed or walking Restrictions Weight Bearing Restrictions: Yes RLE Weight Bearing: Weight bearing as tolerated    Mobility  Bed Mobility Overal bed mobility: Needs Assistance Bed Mobility: Supine to Sit;Sit to Supine     Supine to sit: Min guard Sit to supine: Min guard   General bed mobility comments: Cues for managing independently -- i.e. using LLE to assist RLE, or using a sheet roll to assist  Transfers Overall transfer level: Needs assistance Equipment used: Rolling walker (2 wheeled) Transfers: Sit to/from Stand Sit to Stand: Supervision         General transfer comment: only needing cues for hand placement once  Ambulation/Gait Ambulation/Gait assistance: Supervision Ambulation Distance (Feet): 100 Feet Assistive device: Rolling walker (2 wheeled) Gait Pattern/deviations: Step-through pattern Gait velocity: decreased   General Gait Details: cues for posture and to work towards reciprocal gait pattern   Stairs Stairs: Yes Stairs assistance: Min guard Stair Management: No rails;Step to pattern;Backwards;With walker Number of Stairs: 2 General  stair comments: Pt able to correctly instruct this PT in how to assist her and steady the RW; She should be able to direct her mother in how to assist her  Wheelchair Mobility    Modified Rankin (Stroke Patients Only)       Balance                                    Cognition Arousal/Alertness: Awake/alert Behavior During Therapy: WFL for tasks assessed/performed Overall Cognitive Status: Within Functional Limits for tasks assessed                      Exercises Total Joint Exercises Quad Sets: AROM;Strengthening;Right;10 reps;Supine Heel Slides: AAROM;Right;15 reps;Supine;Other (comment) (Instructed pt in use of a sheet roll for AAROM ) Heel Slides Limitations: pt left with belt to work on knee flexion while in recliner and educated to do this through out day to help improve active/AA knee flexion post surgery Straight Leg Raises: AAROM;Strengthening;Right;10 reps;Supine Goniometric ROM: 10-75 deg    General Comments        Pertinent Vitals/Pain 7/10 R knee with mobility patient repositioned for comfort in CPM    Home Living                      Prior Function            PT Goals (current goals can now be found in the care plan section) Acute Rehab PT Goals Patient Stated Goal: Independence PT Goal Formulation: With patient/family Time For Goal Achievement: 01/02/14 Potential to Achieve Goals:  Good Progress towards PT goals: Progressing toward goals    Frequency  7X/week    PT Plan Current plan remains appropriate    Co-evaluation             End of Session Equipment Utilized During Treatment: Right knee immobilizer;Gait belt Activity Tolerance: Patient tolerated treatment well Patient left: in bed;in CPM;with call bell/phone within reach     Time: 0910-1000 (minus approx 5-7 mins to meet with Case Mgr) PT Time Calculation (min): 50 min  Charges:  $Gait Training: 23-37 mins $Therapeutic Exercise: 8-22 mins                     G Codes:      [facility] Hamff GorhamGarrigan 12/29/2013, 10:15 AM Van ClinesHolly Tasia Liz, PT  Acute Rehabilitation Services Pager [phone] Office [phone]

## 2013-12-29 NOTE — Progress Notes (Signed)
OT Cancellation Note  Patient Details Name: April Hunt MRN: [id number] DOB: 10/10/1950   Cancelled Treatment:    Reason Eval/Treat Not Completed: Patient declined, no reason specified. OT made visit this date to practice LB ADLs and transfers. Pt reports no further concerns. Reviewed precautions and discussed LB ADLs with facilitation of knee flexion. Acute OT to sign off at this time.   Rae LipsLeeann M Jaylon Boylen [phone] 12/29/2013, 9:04 AM

## 2013-12-29 NOTE — Discharge Summary (Signed)
PATIENT ID: April Hunt        MRN:  [id number]          DOB/AGE: 61/01/1951 / 63 y.o.    DISCHARGE SUMMARY  ADMISSION DATE:    12/26/2013 DISCHARGE DATE:   12/29/2013   ADMISSION DIAGNOSIS: OA RIGHT KNEE    DISCHARGE DIAGNOSIS:  OA RIGHT KNEE    ADDITIONAL DIAGNOSIS: Active Problems:   Osteoarthritis of right knee  Past Medical History  Diagnosis Date  . Asthma     SEASONAL  . History of chicken pox   . Allergy     seasonal  . Positive PPD, treated   . History of shingles   . Complication of anesthesia     hard to wake up  . PONV (postoperative nausea and vomiting)   . GERD (gastroesophageal reflux disease)     OTC  . Arthritis   . Hypertension   . Family history of anesthesia complication     BROTHER HAS HARD TIME WAKING UP  . Refusal of blood transfusions as patient is Jehovah's Witness     PROCEDURE: Procedure(s): RIGHT TOTAL KNEE ARTHROPLASTY Right on 12/26/2013  CONSULTS: none     HISTORY:  See H&P in chart  HOSPITAL COURSE:  April Hunt is a 63 y.o. admitted on 12/26/2013 and found to have a diagnosis of OA RIGHT KNEE.  After appropriate laboratory studies were obtained  they were taken to the operating room on 12/26/2013 and underwent  Procedure(s): RIGHT TOTAL KNEE ARTHROPLASTY  Right.   They were given perioperative antibiotics:  Anti-infectives   Start     Dose/Rate Route Frequency Ordered Stop   12/26/13 1400  ceFAZolin (ANCEF) IVPB 1 g/50 mL premix     1 g 100 mL/hr over 30 Minutes Intravenous Every 6 hours 12/26/13 1213 12/26/13 2040   12/26/13 0600  ceFAZolin (ANCEF) IVPB 2 g/50 mL premix     2 g 100 mL/hr over 30 Minutes Intravenous On call to O.R. 12/25/13 1502 12/26/13 0805    .  Tolerated the procedure well.   POD #1, allowed out of bed to a chair.  PT for ambulation and exercise program.  IV saline locked.  O2 discontionued.  POD #2, continued PT and ambulation.   Hemovac pulled. .  The remainder of the hospital course was  dedicated to ambulation and strengthening.   The patient was discharged on 3 Days Post-Op in  Stable condition.  Blood products given:none  DIAGNOSTIC STUDIES: Recent vital signs: Patient Vitals for the past 24 hrs:  BP Temp Temp src Pulse Resp SpO2  12/29/13 0735 133/76 mmHg 98.5 F (36.9 C) Oral 141 18 100 %  12/28/13 2039 116/68 mmHg 99.3 F (37.4 C) Oral 101 18 99 %  12/28/13 1731 131/83 mmHg 97.9 F (36.6 C) Oral 101 18 96 %       Recent laboratory studies:  Recent Labs  12/26/13 1540 12/27/13 0625 12/28/13 0600 12/29/13 0420  WBC 12.3* 11.8* 14.4* 10.8*  HGB 12.6 11.4* 11.8* 12.5  HCT 37.2 33.2* 34.8* 36.3  PLT 237 228 226 233    Recent Labs  12/26/13 1540 12/27/13 0625  NA  --  138  K  --  4.3  CL  --  98  CO2  --  22  BUN  --  10  CREATININE 0.75 0.74  GLUCOSE  --  129*  CALCIUM  --  10.0   Lab Results  Component Value Date  INR 0.88 12/19/2013     Recent Radiographic Studies :  Dg Chest 2 View  12/19/2013   CLINICAL DATA:  Preop for knee replacement.  Hypertension.  EXAM: CHEST  2 VIEW  COMPARISON:  08/06/2010  FINDINGS: The heart size and mediastinal contours are within normal limits. Both lungs are clear. The visualized skeletal structures are unremarkable.  IMPRESSION: No active cardiopulmonary disease.   Electronically Signed   By: Amie Portlandavid  Ormond M.D.   On: 12/19/2013 11:43    DISCHARGE INSTRUCTIONS:   DISCHARGE MEDICATIONS:     Medication List    STOP taking these medications       HYDROcodone-acetaminophen 5-325 MG per tablet  Commonly known as:  NORCO/VICODIN     nabumetone 500 MG tablet  Commonly known as:  RELAFEN      TAKE these medications       b complex vitamins capsule  Take 2 capsules by mouth daily.     BIOTIN PO  Take 1 tablet by mouth daily.     Calcium-Vitamin D 600-200 MG-UNIT Caps  Take 4 capsules by mouth daily.     CINNAMON PO  Take 2 tablets by mouth daily.     cyclobenzaprine 10 MG tablet  Commonly known  as:  FLEXERIL  Take 0.5-1 tablets (5-10 mg total) by mouth 3 (three) times daily as needed for muscle spasms.     enoxaparin 30 MG/0.3ML injection  Commonly known as:  LOVENOX  Inject 0.3 mLs (30 mg total) into the skin every 12 (twelve) hours.     fexofenadine 180 MG tablet  Commonly known as:  ALLEGRA  Take 180 mg by mouth daily.     fluticasone 50 MCG/ACT nasal spray  Commonly known as:  FLONASE  Place 2 sprays into the nose daily as needed. For allergies     hydrochlorothiazide 12.5 MG capsule  Commonly known as:  MICROZIDE  Take 12.5 mg by mouth daily.     L-LYSINE PO  Take 1 tablet by mouth daily.     multivitamin with minerals tablet  Take 2 tablets by mouth daily.     NON FORMULARY  Take 2-4 capsules by mouth daily. Rosemary capsule     NON FORMULARY  Take 2-4 capsules by mouth daily. Horsetail capsule     oxyCODONE 5 MG immediate release tablet  Commonly known as:  ROXICODONE  1-2 tabs po q4-6hrs prn pain     vitamin C 1000 MG tablet  Take 1,000 mg by mouth daily.        FOLLOW UP VISIT:       Follow-up Information   Follow up with CAFFREY JR,W D, MD. Schedule an appointment as soon as possible for a visit in 2 weeks.   Specialty:  Orthopedic Surgery   Contact information:   [address] [place] [address] [phone]       DISPOSITION:   Home  CONDITION:  Stable   Margart SicklesJoshua Rya Rausch 12/29/2013, 8:09 AM

## 2013-12-29 NOTE — Discharge Instructions (Signed)
Diet: As you were doing prior to hospitalization   Activity: Increase activity slowly as tolerated  No lifting or driving for 6 weeks   Shower: May shower without a dressing on post op day #5, NO SOAKING in tub   Dressing: You may change your dressing on post op day #3.  Then change the dressing daily with sterile 4"x4"s gauze dressing  And TED hose for knees. Use paper tape to hold dressing in place  For hips. You may clean the incision with alcohol prior to redressing.   Weight Bearing: weight bearing as taught in physical therapy. Use a walker or  Crutches as instructed.   To prevent constipation: you may use a stool softener such as -  Colace ( over the counter) 100 mg by mouth twice a day  Drink plenty of fluids ( prune juice may be helpful) and high fiber foods  Miralax ( over the counter) for constipation as needed.   Precautions: If you experience chest pain or shortness of breath - call 911 immediately For transfer to the hospital emergency department!!  If you develop a fever greater that 101 F, purulent drainage from wound, increased redness or drainage from wound, or calf pain -- Call the office   Follow- Up Appointment: Please call for an appointment to be seen in 2 weeks  [place] [phone]  [facility] will be providing physical therapist and registered nurse.  Their phone # is [phone]

## 2014-01-12 ENCOUNTER — Other Ambulatory Visit (HOSPITAL_COMMUNITY): Payer: Self-pay | Admitting: Cardiology

## 2014-01-12 ENCOUNTER — Ambulatory Visit (HOSPITAL_COMMUNITY): Payer: BC Managed Care – PPO | Attending: Internal Medicine | Admitting: Cardiology

## 2014-01-12 ENCOUNTER — Encounter: Payer: Self-pay | Admitting: Internal Medicine

## 2014-01-12 DIAGNOSIS — M7989 Other specified soft tissue disorders: Secondary | ICD-10-CM | POA: Insufficient documentation

## 2014-01-12 DIAGNOSIS — M79609 Pain in unspecified limb: Secondary | ICD-10-CM | POA: Insufficient documentation

## 2014-01-12 NOTE — Progress Notes (Signed)
Lower venous duplex limited completed 

## 2014-09-14 ENCOUNTER — Ambulatory Visit: Payer: 59 | Admitting: Physical Therapy

## 2014-09-21 ENCOUNTER — Ambulatory Visit: Payer: 59 | Attending: Psychiatry | Admitting: Physical Therapy

## 2014-09-21 DIAGNOSIS — Z4789 Encounter for other orthopedic aftercare: Secondary | ICD-10-CM | POA: Insufficient documentation

## 2014-09-21 DIAGNOSIS — M542 Cervicalgia: Secondary | ICD-10-CM | POA: Insufficient documentation

## 2014-09-29 ENCOUNTER — Ambulatory Visit: Payer: 59 | Admitting: Physical Therapy

## 2014-09-29 DIAGNOSIS — Z4789 Encounter for other orthopedic aftercare: Secondary | ICD-10-CM | POA: Diagnosis not present

## 2014-10-01 ENCOUNTER — Ambulatory Visit: Payer: 59 | Admitting: Physical Therapy

## 2014-10-01 DIAGNOSIS — Z4789 Encounter for other orthopedic aftercare: Secondary | ICD-10-CM | POA: Diagnosis not present

## 2014-10-06 ENCOUNTER — Ambulatory Visit: Payer: 59 | Admitting: Physical Therapy

## 2014-10-06 DIAGNOSIS — Z4789 Encounter for other orthopedic aftercare: Secondary | ICD-10-CM | POA: Diagnosis not present

## 2014-10-09 ENCOUNTER — Ambulatory Visit: Payer: 59 | Admitting: Physical Therapy

## 2014-11-16 DIAGNOSIS — I10 Essential (primary) hypertension: Secondary | ICD-10-CM | POA: Diagnosis present

## 2015-11-03 ENCOUNTER — Emergency Department (HOSPITAL_BASED_OUTPATIENT_CLINIC_OR_DEPARTMENT_OTHER): Payer: Medicaid Other

## 2015-11-03 ENCOUNTER — Emergency Department (HOSPITAL_BASED_OUTPATIENT_CLINIC_OR_DEPARTMENT_OTHER)
Admission: EM | Admit: 2015-11-03 | Discharge: 2015-11-03 | Disposition: A | Discharge status: Home / Self Care | Payer: Medicaid Other | Attending: Emergency Medicine | Admitting: Emergency Medicine

## 2015-11-03 ENCOUNTER — Encounter (HOSPITAL_BASED_OUTPATIENT_CLINIC_OR_DEPARTMENT_OTHER): Payer: Self-pay | Admitting: Emergency Medicine

## 2015-11-03 DIAGNOSIS — Z8619 Personal history of other infectious and parasitic diseases: Secondary | ICD-10-CM | POA: Diagnosis not present

## 2015-11-03 DIAGNOSIS — J3489 Other specified disorders of nose and nasal sinuses: Secondary | ICD-10-CM

## 2015-11-03 DIAGNOSIS — Z79899 Other long term (current) drug therapy: Secondary | ICD-10-CM | POA: Diagnosis not present

## 2015-11-03 DIAGNOSIS — J45909 Unspecified asthma, uncomplicated: Secondary | ICD-10-CM | POA: Diagnosis not present

## 2015-11-03 DIAGNOSIS — H9202 Otalgia, left ear: Secondary | ICD-10-CM | POA: Diagnosis not present

## 2015-11-03 DIAGNOSIS — Z7951 Long term (current) use of inhaled steroids: Secondary | ICD-10-CM | POA: Diagnosis not present

## 2015-11-03 DIAGNOSIS — R0981 Nasal congestion: Secondary | ICD-10-CM

## 2015-11-03 DIAGNOSIS — R059 Cough, unspecified: Secondary | ICD-10-CM

## 2015-11-03 DIAGNOSIS — M1990 Unspecified osteoarthritis, unspecified site: Secondary | ICD-10-CM | POA: Insufficient documentation

## 2015-11-03 DIAGNOSIS — J029 Acute pharyngitis, unspecified: Secondary | ICD-10-CM | POA: Diagnosis present

## 2015-11-03 DIAGNOSIS — I10 Essential (primary) hypertension: Secondary | ICD-10-CM | POA: Diagnosis not present

## 2015-11-03 DIAGNOSIS — R0789 Other chest pain: Secondary | ICD-10-CM | POA: Diagnosis not present

## 2015-11-03 DIAGNOSIS — H61892 Other specified disorders of left external ear: Secondary | ICD-10-CM | POA: Insufficient documentation

## 2015-11-03 DIAGNOSIS — R05 Cough: Secondary | ICD-10-CM

## 2015-11-03 MED ORDER — AMOXICILLIN-POT CLAVULANATE 875-125 MG PO TABS: 1.0000 | ORAL_TABLET | Freq: Two times a day (BID) | ORAL | Status: DC

## 2015-11-03 MED ORDER — ONDANSETRON 4 MG PO TBDP: 4.0000 mg | ORAL_TABLET | Freq: Three times a day (TID) | ORAL | Status: DC | PRN

## 2015-11-03 MED ORDER — GUAIFENESIN ER 600 MG PO TB12: 600.0000 mg | ORAL_TABLET | Freq: Two times a day (BID) | ORAL | Status: AC

## 2015-11-03 MED ORDER — AMOXICILLIN-POT CLAVULANATE 875-125 MG PO TABS
1.0000 | ORAL_TABLET | Freq: Once | ORAL | Status: AC
  Administered 2015-11-03: 1 via ORAL
  Filled 2015-11-03: qty 1

## 2015-11-03 MED ORDER — IBUPROFEN 800 MG PO TABS: 800.0000 mg | ORAL_TABLET | Freq: Three times a day (TID) | ORAL | Status: DC

## 2015-11-03 MED ORDER — BENZONATATE 100 MG PO CAPS: 100.0000 mg | ORAL_CAPSULE | Freq: Three times a day (TID) | ORAL | Status: DC

## 2015-11-03 MED ORDER — BENZONATATE 100 MG PO CAPS
100.0000 mg | ORAL_CAPSULE | Freq: Once | ORAL | Status: AC
  Administered 2015-11-03: 100 mg via ORAL
  Filled 2015-11-03: qty 1

## 2015-11-03 MED ORDER — TRIAMCINOLONE ACETONIDE 55 MCG/ACT NA AERO: 2.0000 | INHALATION_SPRAY | Freq: Every day | NASAL | Status: DC

## 2015-11-03 MED FILL — IBUPROFEN 800 MG TABLET: 800 | 7 days supply | Qty: 21 | Fill #0

## 2015-11-03 MED FILL — AMOX-CLAV 875-125 MG TABLET: 875-125 | 7 days supply | Qty: 14 | Fill #0

## 2015-11-03 MED FILL — BENZONATATE 100 MG CAPSULE: 100 | 7 days supply | Qty: 21 | Fill #0

## 2015-11-03 MED FILL — ONDANSETRON ODT 4 MG TABLET: 4 | 6 days supply | Qty: 20 | Fill #0

## 2015-11-03 MED FILL — MUCINEX 600 MG TABLET: 600 MG | 20 days supply | Qty: 40 | Fill #0

## 2015-11-03 MED FILL — NASACORT ALLERGY 24HR SPRAY: 55 MCG | 30 days supply | Qty: 11 | Fill #0

## 2015-11-03 NOTE — ED Notes (Signed)
Sore throat, runny nose, congestion, cough, ear pain, chest discomfort, chills, eye pain for two weeks.

## 2015-11-03 NOTE — Discharge Instructions (Signed)
You have been seen today for congestion, cough, sore throat, and body pain. Your imaging showed no abnormalities. Follow up with PCP as needed. Return to ED should symptoms worsen. Drink plenty of fluids and get plenty of rest. Tessalon for cough. Zofran for nausea. Mucinex for congestion. Please take all of your antibiotics until finished!   You may develop abdominal discomfort or diarrhea from the antibiotic.  You may help offset this with probiotics which you can buy or get in yogurt. Do not eat or take the probiotics until 2 hours after your antibiotic.

## 2015-11-03 NOTE — ED Provider Notes (Signed)
CSN: [id number]     Arrival date & time 11/03/15  1046 History   First MD Initiated Contact with Patient 11/03/15 1139     Chief Complaint  Patient presents with  . URI     (Consider location/radiation/quality/duration/timing/severity/associated sxs/prior Treatment) HPI   April Hunt is a 65 y.o. female, with a history of hypertension, asthma, and GERD, presenting to the ED with congestion, runny nose, sore throat, dry cough, chills, facial and head pain, all for the last two weeks. Pt also complains of left ear pain and chest wall tenderness that developed after her coughing. Pt rates her pain at 6/10, throbbing, nonradiating. Pt has tried saline nasal spray and OTC nasal sprays with minimal relief. Pt states she typically gets these symptoms when the weather changes frequently as it has been doing over the last couple weeks. Pt denies fever, N/V/C/D, shortness of breath, abdominal pain, dizziness, or any other complaints.     Past Medical History  Diagnosis Date  . Asthma     SEASONAL  . History of chicken pox   . Allergy     seasonal  . Positive PPD, treated   . History of shingles   . Complication of anesthesia     hard to wake up  . PONV (postoperative nausea and vomiting)   . GERD (gastroesophageal reflux disease)     OTC  . Arthritis   . Hypertension   . Family history of anesthesia complication     BROTHER HAS HARD TIME WAKING UP  . Refusal of blood transfusions as patient is Jehovah's Witness    Past Surgical History  Procedure Laterality Date  . Abdominal hysterectomy  1998    Still has cervix and both ovaries  . Meniscus repair  1995    left knee  . Knee arthroscopy  1997    right knee  . Lymph node resection  1999    benign  . Joint replacement      scope bil knees  . Appendectomy    . Back surgery    . 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 Total knee arthroplasty Right 12/26/2013    Procedure: RIGHT TOTAL KNEE ARTHROPLASTY;   Surgeon: Thera Flake., MD;  Location: [facility] OR;  Service: Orthopedics;  Laterality: Right;   Family History  Problem Relation Age of Onset  . Hyperlipidemia Mother   . Hypertension Mother   . Diabetes Father   . Tuberculosis Father   . Heart disease Paternal Aunt   . Cancer Paternal Uncle     colon cancer  . Cancer Cousin     breast   Social History  Substance Use Topics  . Smoking status: Never Smoker   . Smokeless tobacco: Never Used  . Alcohol Use: Yes     Comment: rarely   OB History    No data available     Review of Systems  Constitutional: Positive for chills. Negative for fever.  HENT: Positive for congestion, ear pain, rhinorrhea, sinus pressure and sore throat.   Respiratory: Positive for cough. Negative for shortness of breath.   Gastrointestinal: Negative for nausea, vomiting, abdominal pain and constipation.  Genitourinary: Negative for dysuria, hematuria and flank pain.  Musculoskeletal: Negative for neck pain and neck stiffness.  Skin: Negative for color change and pallor.  Neurological: Positive for headaches. Negative for dizziness, syncope, weakness, light-headedness and numbness.  All other systems reviewed and are negative.     Allergies  Aspirin; Codeine; and Other  Home Medications   Prior to Admission medications   Medication Sig Start Date End Date Taking? Authorizing Provider  Ascorbic Acid (VITAMIN C) 1000 MG tablet Take 1,000 mg by mouth daily.   Yes Historical Provider, MD  b complex vitamins capsule Take 2 capsules by mouth daily.   Yes Historical Provider, MD  BIOTIN PO Take 1 tablet by mouth daily.   Yes Historical Provider, MD  Calcium Carbonate-Vitamin D (CALCIUM-VITAMIN D) 600-200 MG-UNIT CAPS Take 4 capsules by mouth daily.   Yes Historical Provider, MD  CINNAMON PO Take 2 tablets by mouth daily.   Yes Historical Provider, MD  fexofenadine (ALLEGRA) 180 MG tablet Take 180 mg by mouth daily.   Yes Historical Provider, MD   fluticasone (FLONASE) 50 MCG/ACT nasal spray Place 2 sprays into the nose daily as needed. For allergies 05/10/13  Yes Kaitlyn Szekalski, PA-C  gabapentin (NEURONTIN) 400 MG capsule Take 400 mg by mouth 3 (three) times daily.   Yes Historical Provider, MD  hydrochlorothiazide (MICROZIDE) 12.5 MG capsule Take 12.5 mg by mouth daily.   Yes Historical Provider, MD  L-LYSINE PO Take 1 tablet by mouth daily.   Yes Historical Provider, MD  Multiple Vitamins-Minerals (MULTIVITAMIN WITH MINERALS) tablet Take 2 tablets by mouth daily.   Yes Historical Provider, MD  nabumetone (RELAFEN) 500 MG tablet Take 500 mg by mouth daily as needed.   Yes Historical Provider, MD  NON FORMULARY Take 2-4 capsules by mouth daily. Rosemary capsule   Yes Historical Provider, MD  NON FORMULARY Take 2-4 capsules by mouth daily. Horsetail capsule   Yes Historical Provider, MD  NON FORMULARY    Yes Historical Provider, MD  amoxicillin-clavulanate (AUGMENTIN) 875-125 MG tablet Take 1 tablet by mouth every 12 (twelve) hours. 11/03/15   [name] [name], PA-C  benzonatate (TESSALON) 100 MG capsule Take 1 capsule (100 mg total) by mouth every 8 (eight) hours. 11/03/15   [name] [name], PA-C  guaiFENesin (MUCINEX) 600 MG 12 hr tablet Take 1 tablet (600 mg total) by mouth 2 (two) times daily. 11/03/15   [name] [name], PA-C  ibuprofen (ADVIL,MOTRIN) 800 MG tablet Take 1 tablet (800 mg total) by mouth 3 (three) times daily. 11/03/15   [name] [name], PA-C  ondansetron (ZOFRAN ODT) 4 MG disintegrating tablet Take 1 tablet (4 mg total) by mouth every 8 (eight) hours as needed for nausea or vomiting. 11/03/15   [name] [name], PA-C  triamcinolone (NASACORT AQ) 55 MCG/ACT AERO nasal inhaler Place 2 sprays into the nose daily. 11/03/15   [name] [name], PA-C   BP 148/93 mmHg  Pulse 68  Temp(Src) 98.5 F (36.9 C) (Oral)  Resp 16  Ht  (1.651 m)  Wt 81.647 kg  BMI 29.95 kg/m2  SpO2 100% Physical Exam  Constitutional: She is oriented to person, place, and  time. She appears well-developed and well-nourished. No distress.  HENT:  Head: Normocephalic and atraumatic.  Right Ear: External ear and ear canal normal. Tympanic membrane is bulging.  Left Ear: External ear and ear canal normal. Tympanic membrane is erythematous and bulging.  Nose: Rhinorrhea present. No sinus tenderness. Right sinus exhibits maxillary sinus tenderness. Left sinus exhibits maxillary sinus tenderness.  Mouth/Throat: Uvula is midline, oropharynx is clear and moist and mucous membranes are normal. No trismus in the jaw. No tonsillar abscesses.  Increased sinus pain with bending over. No edema.   Eyes: Conjunctivae and EOM are normal. Pupils are equal, round,  and reactive to light.  Neck: Normal range of motion. Neck supple.  Cardiovascular: Normal rate, regular rhythm, normal heart sounds and intact distal pulses.   Pulmonary/Chest: Effort normal and breath sounds normal. No respiratory distress. She exhibits tenderness (Left anterior chest, worse with deep breathing).  Abdominal: Soft. Bowel sounds are normal. There is no tenderness. There is no guarding.  Musculoskeletal: She exhibits no edema or tenderness.  Full ROM in all extremities and spine. No paraspinal tenderness.   Lymphadenopathy:    She has no cervical adenopathy.  Neurological: She is alert and oriented to person, place, and time. She has normal reflexes.  No sensory deficits. Strength 5/5 in all extremities. No gait disturbance. Coordination intact. Cranial nerves III-XII grossly intact. No facial droop.   Skin: Skin is warm and dry. She is not diaphoretic.  Nursing note and vitals reviewed.   ED Course  Procedures (including critical care time)  Imaging Review Dg Chest 2 View  11/03/2015  CLINICAL DATA:  Two week history of cough and congestion EXAM: CHEST  2 VIEW COMPARISON:  December 19, 2013 FINDINGS: The lungs are clear. Heart size and pulmonary vascularity are normal. No adenopathy. No bone lesions.  IMPRESSION: No edema or consolidation. Electronically Signed   By: Bretta Bang III M.D.   On: 11/03/2015 12:30   I have personally reviewed and evaluated these images as part of my medical decision-making.   EKG Interpretation None      MDM   Final diagnoses:  Cough  Sinus pain  Nasal congestion  Ear pain, left  Sore throat    April Hunt presents with sore throat, ingestion, cough, ear pain, chills, and chest wall pain for the last 2 weeks.  Patient is nontoxic appearing, afebrile, not tachycardic, not tachypneic, maintains SPO2 of 100% on room air, and is in no apparent distress. Patient has no signs of sepsis or other serious or life-threatening condition. Patient has no red flag symptoms. Patient has no indications for labs this time. Chest wall pain is reproducible with palpation and deep breathing. Antibiotic due to duration of symptoms. This antibiotic will cover possible otitis media, pharyngitis, and sinusitis. Pt states she can take ibuprofen without a problem. Pt given prescriptions for tessalon, ibuprofen, augmentin, zofran, mucinex, and Nasacort. The patient was given instructions for home care as well as return precautions. Patient voices understanding of these instructions, accepts the plan, and is comfortable with discharge.   Anselm Pancoast, PA-C 11/03/15 1726  Derwood Kaplan, MD 11/05/15 [phone]

## 2016-03-07 DIAGNOSIS — K219 Gastro-esophageal reflux disease without esophagitis: Secondary | ICD-10-CM | POA: Diagnosis present

## 2016-10-31 DIAGNOSIS — M961 Postlaminectomy syndrome, not elsewhere classified: Secondary | ICD-10-CM

## 2016-10-31 HISTORY — DX: Postlaminectomy syndrome, not elsewhere classified: M96.1

## 2017-02-08 DIAGNOSIS — N952 Postmenopausal atrophic vaginitis: Secondary | ICD-10-CM

## 2017-02-08 HISTORY — DX: Postmenopausal atrophic vaginitis: N95.2

## 2017-03-17 ENCOUNTER — Encounter (HOSPITAL_BASED_OUTPATIENT_CLINIC_OR_DEPARTMENT_OTHER): Payer: Self-pay | Admitting: Emergency Medicine

## 2017-03-17 ENCOUNTER — Emergency Department (HOSPITAL_BASED_OUTPATIENT_CLINIC_OR_DEPARTMENT_OTHER): Payer: Medicare Other

## 2017-03-17 ENCOUNTER — Emergency Department (HOSPITAL_BASED_OUTPATIENT_CLINIC_OR_DEPARTMENT_OTHER)
Admission: EM | Admit: 2017-03-17 | Discharge: 2017-03-17 | Disposition: A | Discharge status: Home / Self Care | Payer: Medicare Other | Attending: Emergency Medicine | Admitting: Emergency Medicine

## 2017-03-17 DIAGNOSIS — J45909 Unspecified asthma, uncomplicated: Secondary | ICD-10-CM | POA: Diagnosis not present

## 2017-03-17 DIAGNOSIS — I10 Essential (primary) hypertension: Secondary | ICD-10-CM | POA: Diagnosis not present

## 2017-03-17 DIAGNOSIS — M5442 Lumbago with sciatica, left side: Secondary | ICD-10-CM | POA: Diagnosis not present

## 2017-03-17 DIAGNOSIS — Z79899 Other long term (current) drug therapy: Secondary | ICD-10-CM | POA: Insufficient documentation

## 2017-03-17 DIAGNOSIS — G8929 Other chronic pain: Secondary | ICD-10-CM

## 2017-03-17 DIAGNOSIS — M542 Cervicalgia: Secondary | ICD-10-CM | POA: Diagnosis not present

## 2017-03-17 DIAGNOSIS — W19XXXA Unspecified fall, initial encounter: Secondary | ICD-10-CM

## 2017-03-17 DIAGNOSIS — Z96653 Presence of artificial knee joint, bilateral: Secondary | ICD-10-CM | POA: Diagnosis not present

## 2017-03-17 DIAGNOSIS — M545 Low back pain: Secondary | ICD-10-CM | POA: Diagnosis present

## 2017-03-17 MED ORDER — KETOROLAC TROMETHAMINE 30 MG/ML IJ SOLN
30.0000 mg | Freq: Once | INTRAMUSCULAR | Status: AC
  Administered 2017-03-17: 30 mg via INTRAMUSCULAR
  Filled 2017-03-17: qty 1

## 2017-03-17 NOTE — ED Triage Notes (Signed)
Pt c/o lower left back pain radiating down left leg since fall yesterday.  Pt was helping a pt in a wheel chair when wheelchair buckled, causing pt to fall over top of chair and pt.

## 2017-03-17 NOTE — Discharge Instructions (Signed)
Please read instructions below.  You can take advil as needed for pain.  Continue taking your gabapentin and flexeril as directed. Schedule an appointment with your PCP for follow up if symptoms persist. Return to ER if new numbness or tingling in your arms or legs, inability to urinate, inability to hold your bowels, or weakness in your extremities.

## 2017-03-17 NOTE — ED Notes (Signed)
Patient transported to X-ray 

## 2017-03-17 NOTE — ED Provider Notes (Signed)
[facility] Provider Note   CSN: [id number] Arrival date & time: 03/17/17  1510   By signing my name below, I, Soijett Blue, attest that this documentation has been prepared under the direction and in the presence of [place] Russo, PA-C Electronically Signed: Soijett Blue, ED Scribe. 03/17/17. 4:44 PM.  History   Chief Complaint Chief Complaint  Patient presents with  . Back Pain    HPI April Hunt is a 66 y.o. female with a PMHx of chronic back pain, HTN, who presents to the Emergency Department complaining of left lower back pain s/p fall occurring yesterday. She reports that the back pain radiates to her left buttock, left leg, and left foot. Pt reports associated neck pain and chronic numbness to left arm. Pt has tried Rx gabapentin and flexeril with no relief of her symptoms. She states that she hasn't had any medications today for her symptoms. She notes that she was helping a pt in a wheelchair when the wheel buckled due to the pt weight and caused her to fall forward over the wheelchair. She notes that she has had L4-L5 surgery with hardware placement and cervical disc issues that she is being evaluated for. Pt reports that she has had steroid injections for her back and has a physical therapy appointment this week. Pt denies bowel/bladder incontinence, difficulty urinating, cauda equina syndrome, fever, and any other symptoms. Denies hx of CA or IV drug use.    Per pt chart review:  Per [facility] note, creatinine 0.76 on 12/21/2016.   The history is provided by the patient. No language interpreter was used.    Past Medical History:  Diagnosis Date  . Allergy    seasonal  . Arthritis   . Asthma    SEASONAL  . Complication of anesthesia    hard to wake up  . Family history of anesthesia complication    BROTHER HAS HARD TIME WAKING UP  . GERD (gastroesophageal reflux disease)    OTC  . History of chicken pox   . History of shingles   . Hypertension    . PONV (postoperative nausea and vomiting)   . Positive PPD, treated   . Refusal of blood transfusions as patient is Jehovah's Witness     Patient Active Problem List   Diagnosis Date Noted  . Osteoarthritis of right knee 12/26/2013  . Headache 05/13/2013  . Sinusitis 03/04/2013  . Back pain 07/21/2012  . Routine general medical examination at a health care facility 03/19/2012  . Joint pain 03/19/2012  . Tinea versicolor 03/19/2012  . Tinea pedis 03/19/2012  . Otitis media 03/19/2012  . Neck pain 12/25/2011  . Asthma 12/25/2011  . 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 Elevated blood pressure reading without diagnosis of hypertension 12/25/2011    Past Surgical History:  Procedure Laterality Date  . ABDOMINAL HYSTERECTOMY  1998   Still has cervix and both ovaries  . APPENDECTOMY    . BACK SURGERY    . JOINT REPLACEMENT     scope bil knees  . KNEE ARTHROSCOPY  1997   right knee  . Lymph node resection  1999   benign  . MENISCUS REPAIR  1995   left knee  . 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   Procedure: RIGHT TOTAL KNEE ARTHROPLASTY;  Surgeon: Thera FlakeW D Caffrey Jr., MD;  Location: [facility] OR;  Service: Orthopedics;  Laterality: Right;    OB  History    No data available       Home Medications    Prior to Admission medications   Medication Sig Start Date End Date Taking? Authorizing Provider  Ascorbic Acid (VITAMIN C) 1000 MG tablet Take 1,000 mg by mouth daily.   Yes [provider]  b complex vitamins capsule Take 2 capsules by mouth daily.   Yes [provider]  BIOTIN PO Take 1 tablet by mouth daily.   Yes [provider]  Calcium Carbonate-Vitamin D (CALCIUM-VITAMIN D) 600-200 MG-UNIT CAPS Take 4 capsules by mouth daily.   Yes [provider]  CINNAMON PO Take 2 tablets by mouth daily.   Yes [provider]  fexofenadine (ALLEGRA) 180 MG tablet Take 180 mg by mouth  daily.   Yes [provider]  fluticasone (FLONASE) 50 MCG/ACT nasal spray Place 2 sprays into the nose daily as needed. For allergies 05/10/13  Yes Szekalski, Kaitlyn, PA-C  gabapentin (NEURONTIN) 400 MG capsule Take 400 mg by mouth 3 (three) times daily.   Yes [provider]  hydrochlorothiazide (MICROZIDE) 12.5 MG capsule Take 12.5 mg by mouth daily.   Yes [provider]  Multiple Vitamins-Minerals (MULTIVITAMIN WITH MINERALS) tablet Take 2 tablets by mouth daily.   Yes [provider]  nabumetone (RELAFEN) 500 MG tablet Take 500 mg by mouth daily as needed.   Yes [provider]  NON FORMULARY Take 2-4 capsules by mouth daily. Rosemary capsule   Yes [provider]  NON FORMULARY Take 2-4 capsules by mouth daily. Horsetail capsule   Yes [provider]  NON FORMULARY    Yes [provider]  amoxicillin-clavulanate (AUGMENTIN) 875-125 MG tablet Take 1 tablet by mouth every 12 (twelve) hours. 11/03/15   [name], [name], PA-C  benzonatate (TESSALON) 100 MG capsule Take 1 capsule (100 mg total) by mouth every 8 (eight) hours. 11/03/15   [name], [name], PA-C  guaiFENesin (MUCINEX) 600 MG 12 hr tablet Take 1 tablet (600 mg total) by mouth 2 (two) times daily. 11/03/15   [name], [name], PA-C  ibuprofen (ADVIL,MOTRIN) 800 MG tablet Take 1 tablet (800 mg total) by mouth 3 (three) times daily. 11/03/15   [name], [name], PA-C  L-LYSINE PO Take 1 tablet by mouth daily.    [provider]  ondansetron (ZOFRAN ODT) 4 MG disintegrating tablet Take 1 tablet (4 mg total) by mouth every 8 (eight) hours as needed for nausea or vomiting. 11/03/15   [name], [name], PA-C  triamcinolone (NASACORT AQ) 55 MCG/ACT AERO nasal inhaler Place 2 sprays into the nose daily. 11/03/15   [name], Hillard Danker, PA-C    Family History Family History  Problem Relation Age of Onset  . Hyperlipidemia Mother   . Hypertension Mother   . Diabetes Father   . Tuberculosis  Father   . Heart disease Paternal Aunt   . Cancer Paternal Uncle        colon cancer  . Cancer Cousin        breast    Social History Social History  Substance Use Topics  . Smoking status: Never Smoker  . Smokeless tobacco: Never Used  . Alcohol use Yes     Comment: rarely     Allergies   Aspirin; Codeine; and Other   Review of Systems Review of Systems  Constitutional: Negative for fever.  Gastrointestinal:       No bowel incontinence.   Genitourinary: Negative for difficulty urinating.  No bladder incontinence.   Musculoskeletal: Positive for back pain (left lower) and neck pain. Negative for gait problem.  Neurological: Positive for numbness (chronic to left arm).     Physical Exam Updated Vital Signs BP 139/89 (BP Location: Right Arm)   Pulse 91   Temp 98 F (36.7 C) (Oral)   Resp 18   Ht 5\' 5"  (1.651 m)   Wt 148 lb (67.1 kg)   SpO2 98%   BMI 24.63 kg/m   Physical Exam  Constitutional: She appears well-developed and well-nourished. No distress.  HENT:  Head: Normocephalic and atraumatic.  Eyes: Conjunctivae are normal.  Neck: Normal range of motion. Neck supple.  Cardiovascular: Normal rate, regular rhythm, normal heart sounds and intact distal pulses.   Pulmonary/Chest: Effort normal and breath sounds normal.  Abdominal: Soft. Bowel sounds are normal.  Musculoskeletal:       Cervical back: She exhibits bony tenderness.       Thoracic back: She exhibits bony tenderness.       Lumbar back: She exhibits tenderness and bony tenderness.  Lower C-spine tenderness, mid-thoracic tenderness, and lumbar spine tenderness. Left lower back and left gluteal tenderness. Moving all extremities.  Neurological: She has normal strength. Gait normal.  5/5 strength to BUE and BLE. Nl gait.   Psychiatric: She has a normal mood and affect. Her behavior is normal.  Nursing note and vitals reviewed.    ED Treatments / Results  DIAGNOSTIC STUDIES: Oxygen  Saturation is 98% on RA, nl by my interpretation.    COORDINATION OF CARE: 4:43 PM Discussed treatment plan with pt at bedside and pt agreed to plan.   Labs (all labs ordered are listed, but only abnormal results are displayed) Labs Reviewed - No data to display  EKG  EKG Interpretation None       Radiology Dg Cervical Spine Complete  Result Date: 03/17/2017 CLINICAL DATA:  Fall with neck pain EXAM: CERVICAL SPINE - COMPLETE 4+ VIEW COMPARISON:  MRI 11/05/2014 FINDINGS: Straightening of the cervical spine. Trace anterolisthesis of C7 on T1. Prevertebral soft tissue thickness is normal. Dens and lateral masses are within normal limits. Moderate severe degenerative changes from C3 through C7 with disc space narrowing and prominent osteophytes. Diffuse foraminal narrowing of the mid cervical spine bilaterally. IMPRESSION: 1. Trace anterolisthesis of C7 on T1, possibly degenerative. Moderate-to-marked multilevel degenerative disc changes. No definite acute osseous abnormality. CT or MRI follow-up may be performed as clinically indicated. Electronically Signed   By: Jasmine Pang M.D.   On: 03/17/2017 18:07   Dg Thoracic Spine W/swimmers  Result Date: 03/17/2017 CLINICAL DATA:  Fall with back pain EXAM: THORACIC SPINE - 3 VIEWS COMPARISON:  11/03/2015 chest x-ray FINDINGS: Twelve rib pairs. Minimal scoliosis of the mid thoracic spine mild degenerative changes. Vertebral body heights are maintained. IMPRESSION: Minimal scoliosis and degenerative change. No acute osseous abnormality. Electronically Signed   By: Jasmine Pang M.D.   On: 03/17/2017 18:01   Dg Lumbar Spine Complete  Result Date: 03/17/2017 CLINICAL DATA:  Fall with back pain EXAM: LUMBAR SPINE - COMPLETE 4+ VIEW COMPARISON:  CT 02/08/2017 FINDINGS: Five non-rib-bearing lumbar type vertebra. Minimal rightward curvature of the lumbar spine. The SI joints are symmetric. Posterior stabilization rods and fixating screws at L4 and L5.  Moderate-to-marked degenerative disc changes at L2-L3 and mild to moderate changes at L1-L2. Vertebral body heights are maintained. IMPRESSION: Postsurgical changes at L4 and L5.  No acute osseous abnormality. Electronically Signed   By: Selena Batten  Jake Samples M.D.   On: 03/17/2017 18:05    Procedures Procedures (including critical care time)  Medications Ordered in ED Medications  ketorolac (TORADOL) 30 MG/ML injection 30 mg (30 mg Intramuscular Given 03/17/17 1704)     Initial Impression / Assessment and Plan / ED Course  I have reviewed the triage vital signs and the nursing notes.  Pertinent labs & imaging results that were available during my care of the patient were reviewed by me and considered in my medical decision making (see chart for details).     Patient with acute on chronic back pain s/p mechanical fall.  No neurological deficits and normal neuro exam. Patient with spinal tenderness: Spine imaged. X-rays consistent with chronic degenerative changes and without evidence of acute pathology. Patient can walk but states is painful.  No loss of bowel or bladder control.  No concern for cauda equina.  No fever, night sweats, weight loss, h/o cancer, IVDU.  RICE protocol and pain medicine indicated and discussed with patient. Patient advised to follow up with her spine specialist. Patient is safe for discharge home.  Patient discussed with Dr. Madilyn Hook.  Discussed results, findings, treatment and follow up. Patient advised of return precautions. Patient verbalized understanding and agreed with plan.  Final Clinical Impressions(s) / ED Diagnoses   Final diagnoses:  Chronic bilateral low back pain with left-sided sciatica  Neck pain    New Prescriptions Discharge Medication List as of 03/17/2017  7:01 PM     I personally performed the services described in this documentation, which was scribed in my presence. The recorded information has been reviewed and is accurate.     Russo, [place]  N, PA-C 03/18/17 0124    Tilden Fossa, MD 03/27/17 [phone]

## 2017-09-07 ENCOUNTER — Emergency Department (HOSPITAL_BASED_OUTPATIENT_CLINIC_OR_DEPARTMENT_OTHER): Payer: Medicare Other

## 2017-09-07 ENCOUNTER — Other Ambulatory Visit: Payer: Self-pay

## 2017-09-07 ENCOUNTER — Emergency Department (HOSPITAL_BASED_OUTPATIENT_CLINIC_OR_DEPARTMENT_OTHER)
Admission: EM | Admit: 2017-09-07 | Discharge: 2017-09-07 | Disposition: A | Discharge status: Home / Self Care | Payer: Medicare Other | Attending: Emergency Medicine | Admitting: Emergency Medicine

## 2017-09-07 ENCOUNTER — Encounter (HOSPITAL_BASED_OUTPATIENT_CLINIC_OR_DEPARTMENT_OTHER): Payer: Self-pay | Admitting: Emergency Medicine

## 2017-09-07 DIAGNOSIS — Y929 Unspecified place or not applicable: Secondary | ICD-10-CM | POA: Diagnosis not present

## 2017-09-07 DIAGNOSIS — Z79899 Other long term (current) drug therapy: Secondary | ICD-10-CM | POA: Insufficient documentation

## 2017-09-07 DIAGNOSIS — X501XXA Overexertion from prolonged static or awkward postures, initial encounter: Secondary | ICD-10-CM | POA: Insufficient documentation

## 2017-09-07 DIAGNOSIS — Y9389 Activity, other specified: Secondary | ICD-10-CM | POA: Insufficient documentation

## 2017-09-07 DIAGNOSIS — I10 Essential (primary) hypertension: Secondary | ICD-10-CM | POA: Diagnosis not present

## 2017-09-07 DIAGNOSIS — Y999 Unspecified external cause status: Secondary | ICD-10-CM | POA: Diagnosis not present

## 2017-09-07 DIAGNOSIS — Z96653 Presence of artificial knee joint, bilateral: Secondary | ICD-10-CM | POA: Insufficient documentation

## 2017-09-07 DIAGNOSIS — S39012A Strain of muscle, fascia and tendon of lower back, initial encounter: Secondary | ICD-10-CM | POA: Insufficient documentation

## 2017-09-07 DIAGNOSIS — S3992XA Unspecified injury of lower back, initial encounter: Secondary | ICD-10-CM | POA: Diagnosis present

## 2017-09-07 DIAGNOSIS — J45909 Unspecified asthma, uncomplicated: Secondary | ICD-10-CM | POA: Insufficient documentation

## 2017-09-07 MED ORDER — CYCLOBENZAPRINE HCL 10 MG PO TABS: 10.0000 mg | ORAL_TABLET | Freq: Two times a day (BID) | ORAL | 0 refills | Status: DC | PRN

## 2017-09-07 MED ORDER — DEXAMETHASONE SODIUM PHOSPHATE 10 MG/ML IJ SOLN
INTRAMUSCULAR | Status: AC
  Filled 2017-09-07: qty 1

## 2017-09-07 MED ORDER — IBUPROFEN 800 MG PO TABS: 800.0000 mg | ORAL_TABLET | Freq: Three times a day (TID) | ORAL | 0 refills | Status: DC | PRN

## 2017-09-07 MED ORDER — DEXAMETHASONE SODIUM PHOSPHATE 10 MG/ML IJ SOLN
10.0000 mg | Freq: Once | INTRAMUSCULAR | Status: AC
  Administered 2017-09-07: 10 mg via INTRAMUSCULAR

## 2017-09-07 NOTE — ED Provider Notes (Signed)
[facility] EMERGENCY DEPARTMENT Provider Note   CSN: [id number] Arrival date & time: 09/07/17  1646     History   Chief Complaint Chief Complaint  Patient presents with  . Back Pain    HPI April Hunt is a 66 y.o. female.  HPI   66 year old female with history of osteoarthritis presenting for evaluation of back pain.  Patient report yesterday afternoon she was trying to lift something from the back of her car seat while she was seen in the front.  While twisting she felt a pop to her low back and has had pain since then.  Pain is sharp in sensation, 7 out of 10, radiates to her left buttock when she turns.  Pain worsening with twisting motion.  Pain not adequately relief despite using a pain patch.  No associated fever, chills, abdominal pain, bowel and bladder incontinence or saddle anesthesia.  Report prior back surgery in 2014.  Denies any new numbness.  Past Medical History:  Diagnosis Date  . Allergy    seasonal  . Arthritis   . Asthma    SEASONAL  . Complication of anesthesia    hard to wake up  . Family history of anesthesia complication    BROTHER HAS HARD TIME WAKING UP  . GERD (gastroesophageal reflux disease)    OTC  . History of chicken pox   . History of shingles   . Hypertension   . PONV (postoperative nausea and vomiting)   . Positive PPD, treated   . Refusal of blood transfusions as patient is Jehovah's Witness     Patient Active Problem List   Diagnosis Date Noted  . Osteoarthritis of right knee 12/26/2013  . Headache 05/13/2013  . Sinusitis 03/04/2013  . Back pain 07/21/2012  . Routine general medical examination at a health care facility 03/19/2012  . Joint pain 03/19/2012  . Tinea versicolor 03/19/2012  . Tinea pedis 03/19/2012  . Otitis media 03/19/2012  . Neck pain 12/25/2011  . Asthma 12/25/2011  . 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 Elevated blood pressure reading without diagnosis of hypertension 12/25/2011     Past Surgical History:  Procedure Laterality Date  . ABDOMINAL HYSTERECTOMY  1998   Still has cervix and both ovaries  . APPENDECTOMY    . BACK SURGERY    . JOINT REPLACEMENT     scope bil knees  . KNEE ARTHROSCOPY  1997   right knee  . Lymph node resection  1999   benign  . MENISCUS REPAIR  1995   left knee  . 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   Procedure: RIGHT TOTAL KNEE ARTHROPLASTY;  Surgeon: Thera FlakeW D Caffrey Jr., MD;  Location: [facility] OR;  Service: Orthopedics;  Laterality: Right;    OB History    No data available       Home Medications    Prior to Admission medications   Medication Sig Start Date End Date Taking? Authorizing Provider  amoxicillin-clavulanate (AUGMENTIN) 875-125 MG tablet Take 1 tablet by mouth every 12 (twelve) hours. 11/03/15   [name], [name], PA-C  Ascorbic Acid (VITAMIN C) 1000 MG tablet Take 1,000 mg by mouth daily.    [provider]  b complex vitamins capsule Take 2 capsules by mouth daily.    [provider]  benzonatate (TESSALON) 100 MG capsule Take 1 capsule (100 mg total) by mouth every 8 (eight) hours. 11/03/15  [name], [name], PA-C  BIOTIN PO Take 1 tablet by mouth daily.    [provider]  Calcium Carbonate-Vitamin D (CALCIUM-VITAMIN D) 600-200 MG-UNIT CAPS Take 4 capsules by mouth daily.    [provider]  CINNAMON PO Take 2 tablets by mouth daily.    [provider]  fexofenadine (ALLEGRA) 180 MG tablet Take 180 mg by mouth daily.    [provider]  fluticasone (FLONASE) 50 MCG/ACT nasal spray Place 2 sprays into the nose daily as needed. For allergies 05/10/13   Emilia BeckSzekalski, Kaitlyn, PA-C  gabapentin (NEURONTIN) 400 MG capsule Take 400 mg by mouth 3 (three) times daily.    [provider]  guaiFENesin (MUCINEX) 600 MG 12 hr tablet Take 1 tablet (600 mg total) by mouth 2 (two) times daily. 11/03/15   [name], [name], PA-C   hydrochlorothiazide (MICROZIDE) 12.5 MG capsule Take 12.5 mg by mouth daily.    [provider]  ibuprofen (ADVIL,MOTRIN) 800 MG tablet Take 1 tablet (800 mg total) by mouth 3 (three) times daily. 11/03/15   [name], [name], PA-C  L-LYSINE PO Take 1 tablet by mouth daily.    [provider]  Multiple Vitamins-Minerals (MULTIVITAMIN WITH MINERALS) tablet Take 2 tablets by mouth daily.    [provider]  nabumetone (RELAFEN) 500 MG tablet Take 500 mg by mouth daily as needed.    [provider]  NON FORMULARY Take 2-4 capsules by mouth daily. Rosemary capsule    [provider]  NON FORMULARY Take 2-4 capsules by mouth daily. Horsetail capsule    [provider]  NON FORMULARY     [provider]  ondansetron (ZOFRAN ODT) 4 MG disintegrating tablet Take 1 tablet (4 mg total) by mouth every 8 (eight) hours as needed for nausea or vomiting. 11/03/15   [name], [name], PA-C  triamcinolone (NASACORT AQ) 55 MCG/ACT AERO nasal inhaler Place 2 sprays into the nose daily. 11/03/15   [name], Hillard DankerShawn C, PA-C    Family History Family History  Problem Relation Age of Onset  . Hyperlipidemia Mother   . Hypertension Mother   . Diabetes Father   . Tuberculosis Father   . Heart disease Paternal Aunt   . Cancer Paternal Uncle        colon cancer  . Cancer Cousin        breast    Social History Social History   Tobacco Use  . Smoking status: Never Smoker  . Smokeless tobacco: Never Used  Substance Use Topics  . Alcohol use: Yes    Comment: rarely  . Drug use: No     Allergies   Aspirin; Codeine; and Other   Review of Systems Review of Systems  All other systems reviewed and are negative.    Physical Exam Updated Vital Signs BP 137/87 (BP Location: Right Arm)   Pulse 72   Temp 98.3 F (36.8 C) (Oral)   Resp 18   Ht 5\' 5"  (1.651 m)   Wt 69.4 kg (153 lb)   SpO2 100%   BMI 25.46 kg/m   Physical Exam  Constitutional: She  appears well-developed and well-nourished. No distress.  HENT:  Head: Atraumatic.  Eyes: Conjunctivae are normal.  Neck: Neck supple.  Abdominal: Soft. She exhibits no distension. There is no tenderness.  Musculoskeletal: She exhibits tenderness (Tenderness along lumbar spine at the level of L2-L3 and right paralumbar spinal muscle without any overlying skin changes, crepitus or step-off.).  Neurological: She  is alert.  Sensation is intact to bilateral lower extremity with intact patellar deep tendon reflex, no foot drops  Skin: No rash noted.  Psychiatric: She has a normal mood and affect.  Nursing note and vitals reviewed.    ED Treatments / Results  Labs (all labs ordered are listed, but only abnormal results are displayed) Labs Reviewed - No data to display  EKG  EKG Interpretation None       Radiology Dg Lumbar Spine Complete  Result Date: 09/07/2017 CLINICAL DATA:  Left low back and left leg pain after feeling and hearing a popping sensation when bending over today. EXAM: LUMBAR SPINE - COMPLETE 4+ VIEW COMPARISON:  03/17/2017. FINDINGS: Five non-rib-bearing lumbar vertebrae. Stable interbody and pedicle screw and rod fusion at the L4-5 level. Mild anterior spur formation at multiple levels with marked disc space narrowing at the L2-3 level. No fractures, pars defects or subluxations. IMPRESSION: No acute abnormality. Stable postoperative and degenerative changes. Electronically Signed   By: Beckie Salts M.D.   On: 09/07/2017 19:45    Procedures Procedures (including critical care time)  Medications Ordered in ED Medications - No data to display   Initial Impression / Assessment and Plan / ED Course  I have reviewed the triage vital signs and the nursing notes.  Pertinent labs & imaging results that were available during my care of the patient were reviewed by me and considered in my medical decision making (see chart for details).     BP 137/87 (BP Location:  Right Arm)   Pulse 72   Temp 98.3 F (36.8 C) (Oral)   Resp 18   Ht 5\' 5"  (1.651 m)   Wt 69.4 kg (153 lb)   SpO2 100%   BMI 25.46 kg/m    Final Clinical Impressions(s) / ED Diagnoses   Final diagnoses:  Strain of lumbar region, initial encounter    ED Discharge Orders        Ordered    ibuprofen (ADVIL,MOTRIN) 800 MG tablet  Every 8 hours PRN     09/07/17 2024    cyclobenzaprine (FLEXERIL) 10 MG tablet  2 times daily PRN     09/07/17 2024     7:40 PM Patient injured her low back after she twisted around to pick up an item while sitting in the car.  Pain is likely muscle strain.  No red flags.  Given her age, x-ray ordered.  She is neurovascular intact.  8:25 PM L-spine x-ray without concerning feature.  This is likely a muscle strain.  Patient sent home with NSAIDs and muscle relaxant.  Recommend outpatient follow-up as needed.  Return precautions discussed.  Care discussed with Dr. Dalene Seltzer.   Fayrene Helper, PA-C 09/07/17 2026    Alvira Monday, MD 09/08/17 2101

## 2017-09-07 NOTE — ED Triage Notes (Signed)
Patient states that she was trying to lift something yesterday and heard her lower back pop. Patient states that she now has muscle spasms and pain

## 2017-09-07 NOTE — ED Notes (Addendum)
Pt was performing a twisting motion to get something out of the backseat of her car yesterday when the pain started. Pt denies any episodes of incontinence. Pt has had no relief with heat pad, or APAP.

## 2018-02-23 ENCOUNTER — Emergency Department (HOSPITAL_BASED_OUTPATIENT_CLINIC_OR_DEPARTMENT_OTHER): Payer: Medicare Other

## 2018-02-23 ENCOUNTER — Other Ambulatory Visit: Payer: Self-pay

## 2018-02-23 ENCOUNTER — Emergency Department (HOSPITAL_BASED_OUTPATIENT_CLINIC_OR_DEPARTMENT_OTHER)
Admission: EM | Admit: 2018-02-23 | Discharge: 2018-02-23 | Disposition: A | Discharge status: Home / Self Care | Payer: Medicare Other | Attending: Emergency Medicine | Admitting: Emergency Medicine

## 2018-02-23 ENCOUNTER — Encounter (HOSPITAL_BASED_OUTPATIENT_CLINIC_OR_DEPARTMENT_OTHER): Payer: Self-pay | Admitting: Emergency Medicine

## 2018-02-23 DIAGNOSIS — W19XXXA Unspecified fall, initial encounter: Secondary | ICD-10-CM

## 2018-02-23 DIAGNOSIS — Y999 Unspecified external cause status: Secondary | ICD-10-CM | POA: Diagnosis not present

## 2018-02-23 DIAGNOSIS — M25552 Pain in left hip: Secondary | ICD-10-CM | POA: Insufficient documentation

## 2018-02-23 DIAGNOSIS — I10 Essential (primary) hypertension: Secondary | ICD-10-CM | POA: Diagnosis not present

## 2018-02-23 DIAGNOSIS — Z79899 Other long term (current) drug therapy: Secondary | ICD-10-CM | POA: Diagnosis not present

## 2018-02-23 DIAGNOSIS — Y92018 Other place in single-family (private) house as the place of occurrence of the external cause: Secondary | ICD-10-CM | POA: Insufficient documentation

## 2018-02-23 DIAGNOSIS — M545 Low back pain: Secondary | ICD-10-CM | POA: Insufficient documentation

## 2018-02-23 DIAGNOSIS — Z96653 Presence of artificial knee joint, bilateral: Secondary | ICD-10-CM | POA: Insufficient documentation

## 2018-02-23 DIAGNOSIS — W108XXA Fall (on) (from) other stairs and steps, initial encounter: Secondary | ICD-10-CM | POA: Insufficient documentation

## 2018-02-23 DIAGNOSIS — M549 Dorsalgia, unspecified: Secondary | ICD-10-CM

## 2018-02-23 DIAGNOSIS — Y92009 Unspecified place in unspecified non-institutional (private) residence as the place of occurrence of the external cause: Secondary | ICD-10-CM

## 2018-02-23 DIAGNOSIS — Y9301 Activity, walking, marching and hiking: Secondary | ICD-10-CM | POA: Diagnosis not present

## 2018-02-23 DIAGNOSIS — M25562 Pain in left knee: Secondary | ICD-10-CM | POA: Insufficient documentation

## 2018-02-23 DIAGNOSIS — M25512 Pain in left shoulder: Secondary | ICD-10-CM | POA: Diagnosis not present

## 2018-02-23 MED ORDER — KETOROLAC TROMETHAMINE 30 MG/ML IJ SOLN
30.0000 mg | Freq: Once | INTRAMUSCULAR | Status: AC
Start: 2018-02-23 — End: 2018-02-23
  Administered 2018-02-23: 30 mg via INTRAMUSCULAR
  Filled 2018-02-23: qty 1

## 2018-02-23 MED ORDER — TRAMADOL HCL 50 MG PO TABS
50.0000 mg | ORAL_TABLET | Freq: Once | ORAL | Status: AC
  Administered 2018-02-23: 50 mg via ORAL
  Filled 2018-02-23: qty 1

## 2018-02-23 MED ORDER — CYCLOBENZAPRINE HCL 5 MG PO TABS: 5.0000 mg | ORAL_TABLET | Freq: Three times a day (TID) | ORAL | 0 refills | Status: DC | PRN

## 2018-02-23 NOTE — ED Notes (Signed)
Pt

## 2018-02-23 NOTE — ED Provider Notes (Signed)
[facility] EMERGENCY DEPARTMENT Provider Note   CSN: [id number] Arrival date & time: 02/23/18  1839   History   Chief Complaint Chief Complaint  Patient presents with  . Fall    HPI April Hunt is a 67 y.o. female with PMH of asthma, arthritis, GERD, HTN here after a fall  Patient notes that a few hours ago she was walking down the stairs in her home and lost her footing and fell down 1 flight of stairs.  She notes that the stairs were carpeted.  She notes that she tumbled down the stairs, does not remember hitting her head or blacking out.  She notes that she landed on the left side of her body at the end of the stairs.  She notes the entire left side of her body hurts mostly her left shoulder, left hip, and left knee.  She also notes that she has low back pain as well.  Patient has not tried any medications for this this yet.  She notes that she was wearing a turban which may have provided extra padding on her head.  She denies any headache, loss of consciousness, visual changes.  Denies any chest pain, palpitations, shortness of breath.   HPI  Past Medical History:  Diagnosis Date  . Allergy    seasonal  . Arthritis   . Asthma    SEASONAL  . Complication of anesthesia    hard to wake up  . Family history of anesthesia complication    BROTHER HAS HARD TIME WAKING UP  . GERD (gastroesophageal reflux disease)    OTC  . History of chicken pox   . History of shingles   . Hypertension   . PONV (postoperative nausea and vomiting)   . Positive PPD, treated   . Refusal of blood transfusions as patient is Jehovah's Witness     Patient Active Problem List   Diagnosis Date Noted  . Osteoarthritis of right knee 12/26/2013  . Headache 05/13/2013  . Sinusitis 03/04/2013  . Back pain 07/21/2012  . Routine general medical examination at a health care facility 03/19/2012  . Joint pain 03/19/2012  . Tinea versicolor 03/19/2012  . Tinea pedis 03/19/2012  . Otitis  media 03/19/2012  . Neck pain 12/25/2011  . Asthma 12/25/2011  . 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 Elevated blood pressure reading without diagnosis of hypertension 12/25/2011    Past Surgical History:  Procedure Laterality Date  . ABDOMINAL HYSTERECTOMY  1998   Still has cervix and both ovaries  . APPENDECTOMY    . BACK SURGERY    . JOINT REPLACEMENT     scope bil knees  . KNEE ARTHROSCOPY  1997   right knee  . Lymph node resection  1999   benign  . MENISCUS REPAIR  1995   left knee  . 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   Procedure: RIGHT TOTAL KNEE ARTHROPLASTY;  Surgeon: Thera Flake., MD;  Location: [facility] OR;  Service: Orthopedics;  Laterality: Right;     OB History   None      Home Medications    Prior to Admission medications   Medication Sig Start Date End Date Taking? Authorizing Provider  amoxicillin-clavulanate (AUGMENTIN) 875-125 MG tablet Take 1 tablet by mouth every 12 (twelve) hours. 11/03/15   [name], [name], PA-C  Ascorbic Acid (VITAMIN C) 1000 MG tablet Take 1,000 mg by mouth daily.  [provider]  b complex vitamins capsule Take 2 capsules by mouth daily.    [provider]  benzonatate (TESSALON) 100 MG capsule Take 1 capsule (100 mg total) by mouth every 8 (eight) hours. 11/03/15   [name], [name], PA-C  BIOTIN PO Take 1 tablet by mouth daily.    [provider]  Calcium Carbonate-Vitamin D (CALCIUM-VITAMIN D) 600-200 MG-UNIT CAPS Take 4 capsules by mouth daily.    [provider]  CINNAMON PO Take 2 tablets by mouth daily.    [provider]  cyclobenzaprine (FLEXERIL) 5 MG tablet Take 1 tablet (5 mg total) by mouth 3 (three) times daily as needed for muscle spasms. 02/23/18   Beaulah Dinning, MD  fexofenadine (ALLEGRA) 180 MG tablet Take 180 mg by mouth daily.    [provider]  fluticasone (FLONASE) 50 MCG/ACT nasal spray Place 2  sprays into the nose daily as needed. For allergies 05/10/13   Emilia Beck, PA-C  gabapentin (NEURONTIN) 400 MG capsule Take 400 mg by mouth 3 (three) times daily.    [provider]  guaiFENesin (MUCINEX) 600 MG 12 hr tablet Take 1 tablet (600 mg total) by mouth 2 (two) times daily. 11/03/15   [name], [name], PA-C  hydrochlorothiazide (MICROZIDE) 12.5 MG capsule Take 12.5 mg by mouth daily.    [provider]  ibuprofen (ADVIL,MOTRIN) 800 MG tablet Take 1 tablet (800 mg total) by mouth every 8 (eight) hours as needed for moderate pain. 09/07/17   Fayrene Helper, PA-C  L-LYSINE PO Take 1 tablet by mouth daily.    [provider]  Multiple Vitamins-Minerals (MULTIVITAMIN WITH MINERALS) tablet Take 2 tablets by mouth daily.    [provider]  nabumetone (RELAFEN) 500 MG tablet Take 500 mg by mouth daily as needed.    [provider]  NON FORMULARY Take 2-4 capsules by mouth daily. Rosemary capsule    [provider]  NON FORMULARY Take 2-4 capsules by mouth daily. Horsetail capsule    [provider]  NON FORMULARY     [provider]  ondansetron (ZOFRAN ODT) 4 MG disintegrating tablet Take 1 tablet (4 mg total) by mouth every 8 (eight) hours as needed for nausea or vomiting. 11/03/15   [name], [name], PA-C  triamcinolone (NASACORT AQ) 55 MCG/ACT AERO nasal inhaler Place 2 sprays into the nose daily. 11/03/15   [name], Hillard Danker, PA-C    Family History Family History  Problem Relation Age of Onset  . Hyperlipidemia Mother   . Hypertension Mother   . Diabetes Father   . Tuberculosis Father   . Heart disease Paternal Aunt   . Cancer Paternal Uncle        colon cancer  . Cancer Cousin        breast    Social History Social History   Tobacco Use  . Smoking status: Never Smoker  . Smokeless tobacco: Never Used  Substance Use Topics  . Alcohol use: Yes    Comment: rarely  . Drug use: No     Allergies   Aspirin;  Codeine; and Other   Review of Systems Review of Systems: see HPI   Physical Exam Updated Vital Signs BP 118/74 (BP Location: Right Arm)   Pulse 63   Temp 98.5 F (36.9 C) (Oral)   Resp 16   Ht 5\' 5"  (1.651 m)   Wt 68.5 kg (151 lb)   SpO2 100%   BMI 25.13 kg/m  Physical Exam  Constitutional: She is oriented to person, place, and time. She appears well-developed and well-nourished.  HENT:  Head: Normocephalic and atraumatic.  Right Ear: External ear normal.  Left Ear: External ear normal.  Nose: Nose normal.  Mouth/Throat: Oropharynx is clear and moist.  Eyes: Pupils are equal, round, and reactive to light. Conjunctivae are normal.  Neck: Normal range of motion. Neck supple.  Cardiovascular: Normal rate, regular rhythm and intact distal pulses.  Pulmonary/Chest: Effort normal and breath sounds normal. No respiratory distress. She has no wheezes. She exhibits tenderness (tender to palpation of left sided ribs, mostly in midaxillary line).  Abdominal: Soft. Bowel sounds are normal. She exhibits no distension. There is no tenderness.  Musculoskeletal: She exhibits no edema or deformity.       Left shoulder: She exhibits tenderness, bony tenderness and decreased strength (4/5). She exhibits normal range of motion, no swelling, no deformity and no laceration.       Left elbow: Normal.       Left wrist: Normal.       Right hip: Normal.       Left hip: She exhibits decreased range of motion (secondary to pain), decreased strength (poor effort secondary to pain) and tenderness. She exhibits no swelling and no deformity.       Right knee: Normal.       Left knee: She exhibits normal range of motion, no swelling and no effusion. Tenderness found.       Thoracic back: She exhibits tenderness and bony tenderness. She exhibits normal range of motion, no swelling, no edema and no spasm.       Lumbar back: She exhibits tenderness and bony tenderness. She exhibits normal range of motion.    Neurological: She is alert and oriented to person, place, and time. No cranial nerve deficit or sensory deficit. She exhibits normal muscle tone. Coordination normal.  Skin: Skin is warm. Capillary refill takes less than 2 seconds. No rash noted.  Psychiatric: She has a normal mood and affect. Her behavior is normal. Judgment and thought content normal.   ED Treatments / Results  Labs (all labs ordered are listed, but only abnormal results are displayed) Labs Reviewed - No data to display  EKG None  Radiology Dg Ribs Unilateral W/chest Left  Result Date: 02/23/2018 CLINICAL DATA:  Status post fall with left rib pain EXAM: LEFT RIBS AND CHEST - 3+ VIEW COMPARISON:  None. FINDINGS: No fracture or other bone lesions are seen involving the ribs. There is no evidence of pneumothorax or pleural effusion. Both lungs are clear. Heart size and mediastinal contours are within normal limits. IMPRESSION: No acute fracture or dislocation of left ribs. Electronically Signed   By: Sherian ReinWei-Chen  Lin M.D.   On: 02/23/2018 20:34   Dg Thoracic Spine 2 View  Result Date: 02/23/2018 CLINICAL DATA:  Status post fall with back pain. EXAM: THORACIC SPINE 2 VIEWS COMPARISON:  March 17, 2017 FINDINGS: There is no evidence of thoracic spine fracture. There is scoliosis of spine. No other significant bone abnormalities are identified. IMPRESSION: No acute fracture or dislocation. Electronically Signed   By: Sherian ReinWei-Chen  Lin M.D.   On: 02/23/2018 20:30   Dg Lumbar Spine Complete  Result Date: 02/23/2018 CLINICAL DATA:  Status post fall with back pain EXAM: LUMBAR SPINE - COMPLETE 4+ VIEW COMPARISON:  September 07, 2017 FINDINGS: There is no evidence of lumbar spine fracture. Alignment is normal. Prior posterior fusion of L4-L5 noted. Mild degenerative joint changes  with anterior osteophytosis are identified in the lumbar spine. IMPRESSION: No acute fracture or dislocation. Electronically Signed   By: Sherian Rein M.D.   On:  02/23/2018 20:30   Dg Shoulder Left  Result Date: 02/23/2018 CLINICAL DATA:  Status post fall with left shoulder pain. EXAM: LEFT SHOULDER - 2+ VIEW COMPARISON:  September 20, 2009 FINDINGS: There is no evidence of fracture or dislocation. Soft tissues are unremarkable. IMPRESSION: No acute fracture or dislocation. Electronically Signed   By: Sherian Rein M.D.   On: 02/23/2018 20:29   Dg Knee Complete 4 Views Left  Result Date: 02/23/2018 CLINICAL DATA:  Status post fall with left knee pain EXAM: LEFT KNEE - COMPLETE 4+ VIEW COMPARISON:  None. FINDINGS: No evidence of fracture, dislocation, or joint effusion. Minimal tibial spine osteophytosis is noted. Soft tissues are unremarkable. IMPRESSION: No acute fracture or dislocation. Electronically Signed   By: Sherian Rein M.D.   On: 02/23/2018 20:31   Dg Hip Unilat With Pelvis 2-3 Views Left  Result Date: 02/23/2018 CLINICAL DATA:  Status post fall with left hip pain. EXAM: DG HIP (WITH OR WITHOUT PELVIS) 2-3V LEFT COMPARISON:  None. FINDINGS: There is no evidence of hip fracture or dislocation. Osteophytosis of the proximal left femur are noted. Patient status post prior fixation of lower lumbar spine. IMPRESSION: No acute fracture or dislocation. Electronically Signed   By: Sherian Rein M.D.   On: 02/23/2018 20:32    Procedures Procedures (including critical care time)  Medications Ordered in ED Medications  ketorolac (TORADOL) 30 MG/ML injection 30 mg (has no administration in time range)  traMADol (ULTRAM) tablet 50 mg (50 mg Oral Given 02/23/18 2018)     Initial Impression / Assessment and Plan / ED Course  I have reviewed the triage vital signs and the nursing notes.  Pertinent labs & imaging results that were available during my care of the patient were reviewed by me and considered in my medical decision making (see chart for details).    67 year old female here after mechanical fall today, fell down the stairs inside of her home.   Landed on the left side of her body, did not hit head. No LOC. Endorsed pain mostly in left shoulder, thoracolumbar back, left chest, left hip and knee. No obvious signs of dislocation or fracture on exam however she is tender to palpation in all of these areas. Given patient age and risk of bone fracture, X-rays performed of the left shoulder, left chest, thoracolumbar spine, left hip with pelvis, left knee.  All x-rays did not reveal any acute fracture.  Patient's pain likely muscle pain secondary to fall.  She was given 30 mg of Toradol and sent home with prescription for 5 mg 3 times daily as needed of Flexeril.  Return precautions discussed, she will follow-up with her PCP.   Final Clinical Impressions(s) / ED Diagnoses   Final diagnoses:  Back pain  Fall in home, initial encounter  Acute pain of left shoulder  Left hip pain    ED Discharge Orders        Ordered    cyclobenzaprine (FLEXERIL) 5 MG tablet  3 times daily PRN     02/23/18 2118       Beaulah Dinning, MD 02/23/18 2131    Maia Plan, MD 02/24/18 [phone]

## 2018-02-23 NOTE — ED Notes (Signed)
Pt not in room, pt in radiology.  

## 2018-02-23 NOTE — ED Notes (Signed)
Back from xray, alert, NAD, calm.  ?

## 2018-02-23 NOTE — ED Notes (Signed)
ED Provider at bedside. 

## 2018-02-23 NOTE — ED Notes (Signed)
EDP into room to discuss results and plan.  

## 2018-02-23 NOTE — ED Triage Notes (Signed)
Patient states that she fell down the steps and her whole left side is hurting

## 2018-02-23 NOTE — ED Notes (Signed)
VSS. Up to b/r, steady gait with cane.

## 2018-02-23 NOTE — ED Notes (Signed)
Alert, NAD, calm, interactive, resps e/u, speaking in clear complete sentences, no dyspnea noted, skin W&D, VSS, reports continued arm pain, (denies: sob, nausea, dizziness or visual changes). [facility]

## 2018-02-23 NOTE — Discharge Instructions (Signed)
Seen today after falling down the stairs.  We have x-rayed your bones and there are no signs of fractures.  Your pain is likely secondary to muscle bruising after the fall.  We have given you pain medication while you were here.  At home you can take Flexeril 5 mg 3 times a day as needed.  Please follow-up with your PCP.  Take care!

## 2018-02-23 NOTE — ED Notes (Signed)
Patient transported to X-ray 

## 2018-06-29 IMAGING — CR DG LUMBAR SPINE COMPLETE 4+V
5 series · 5 of 5 positions shown · non-contrast
Comparison: September 07, 2017

CLINICAL DATA: Status post fall with back pain

EXAM:
LUMBAR SPINE - COMPLETE 4+ VIEW

[t l-spine a.p.]
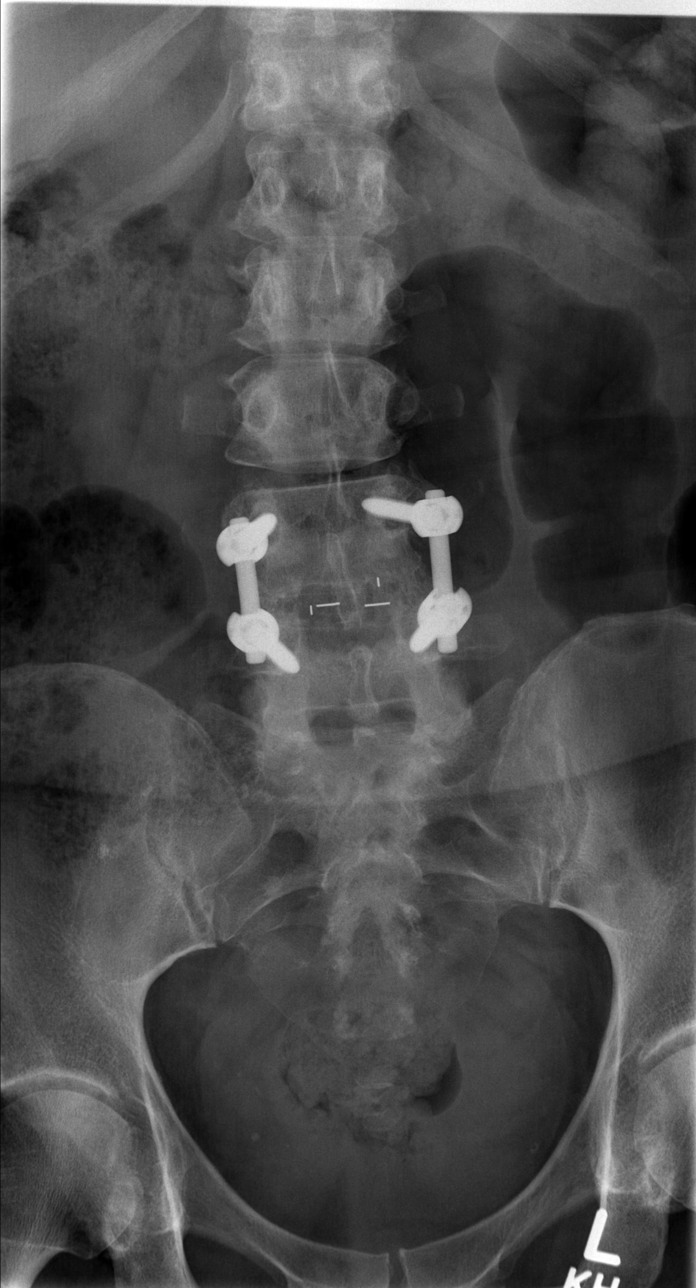

[t l-spine oblique exposure (1 of 2)]
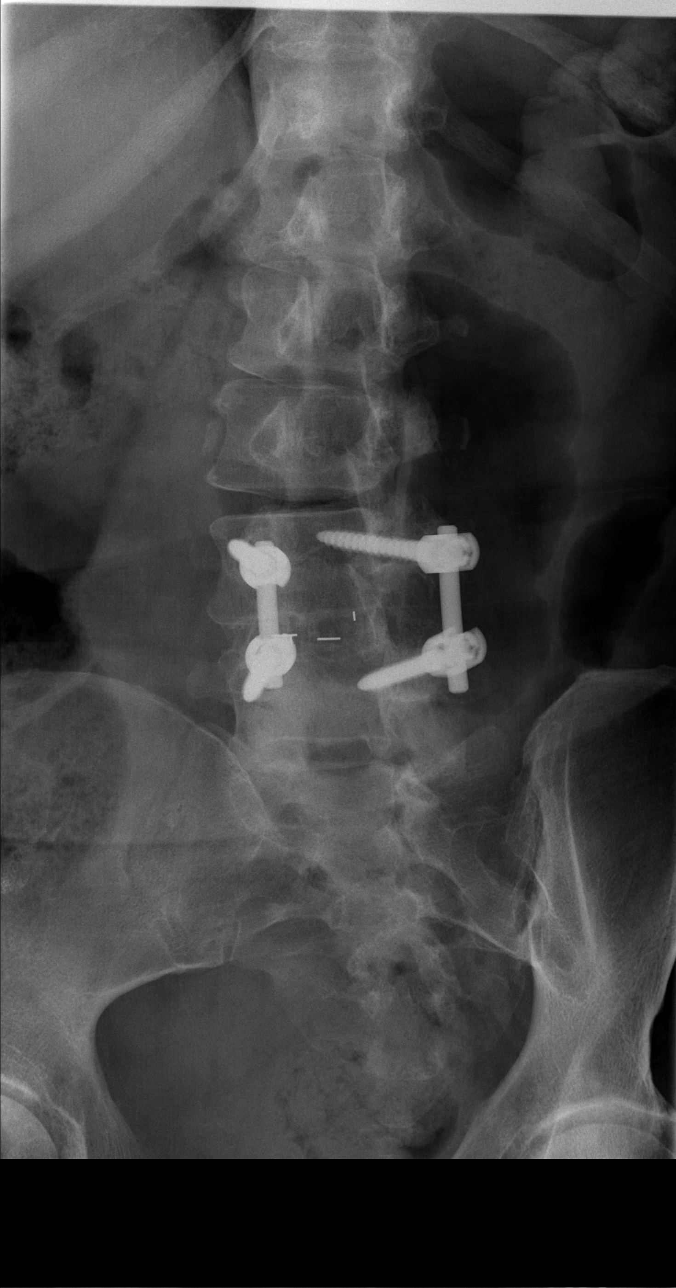

[t l-spine oblique exposure (2 of 2)]
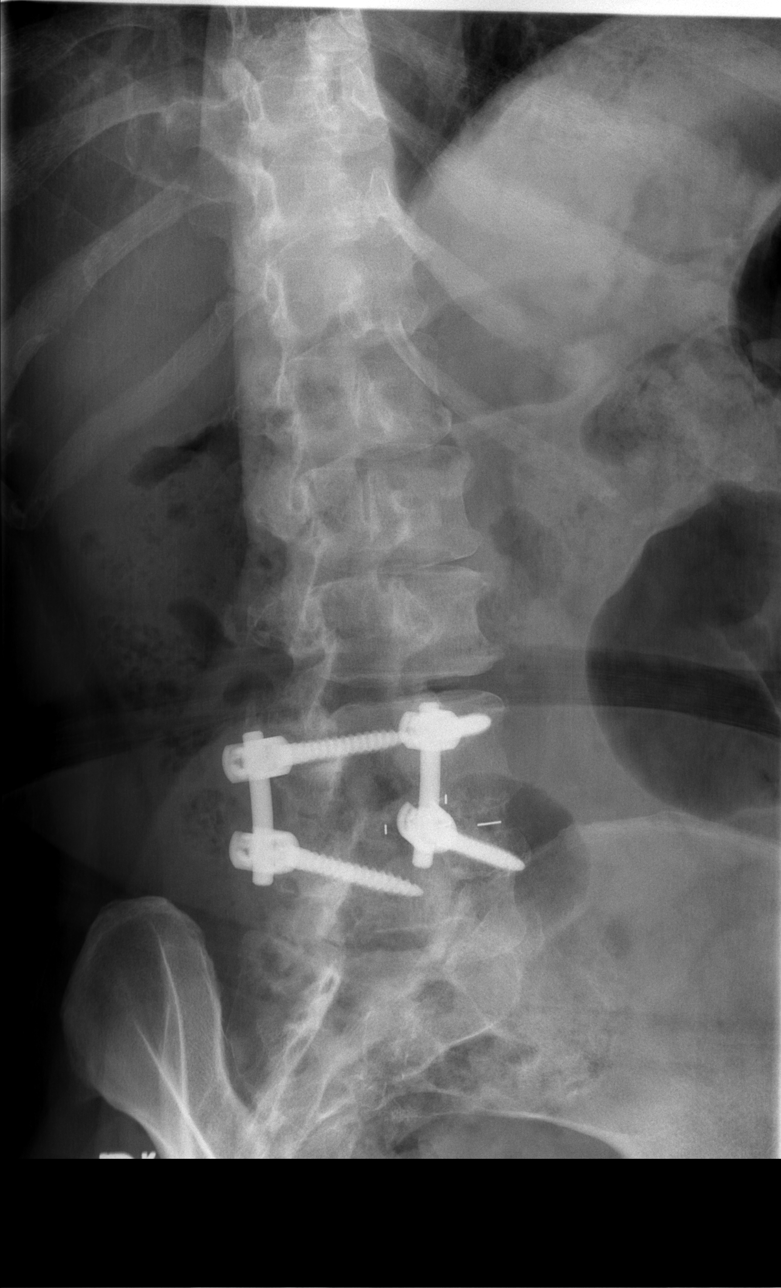

[t l-spine lat]
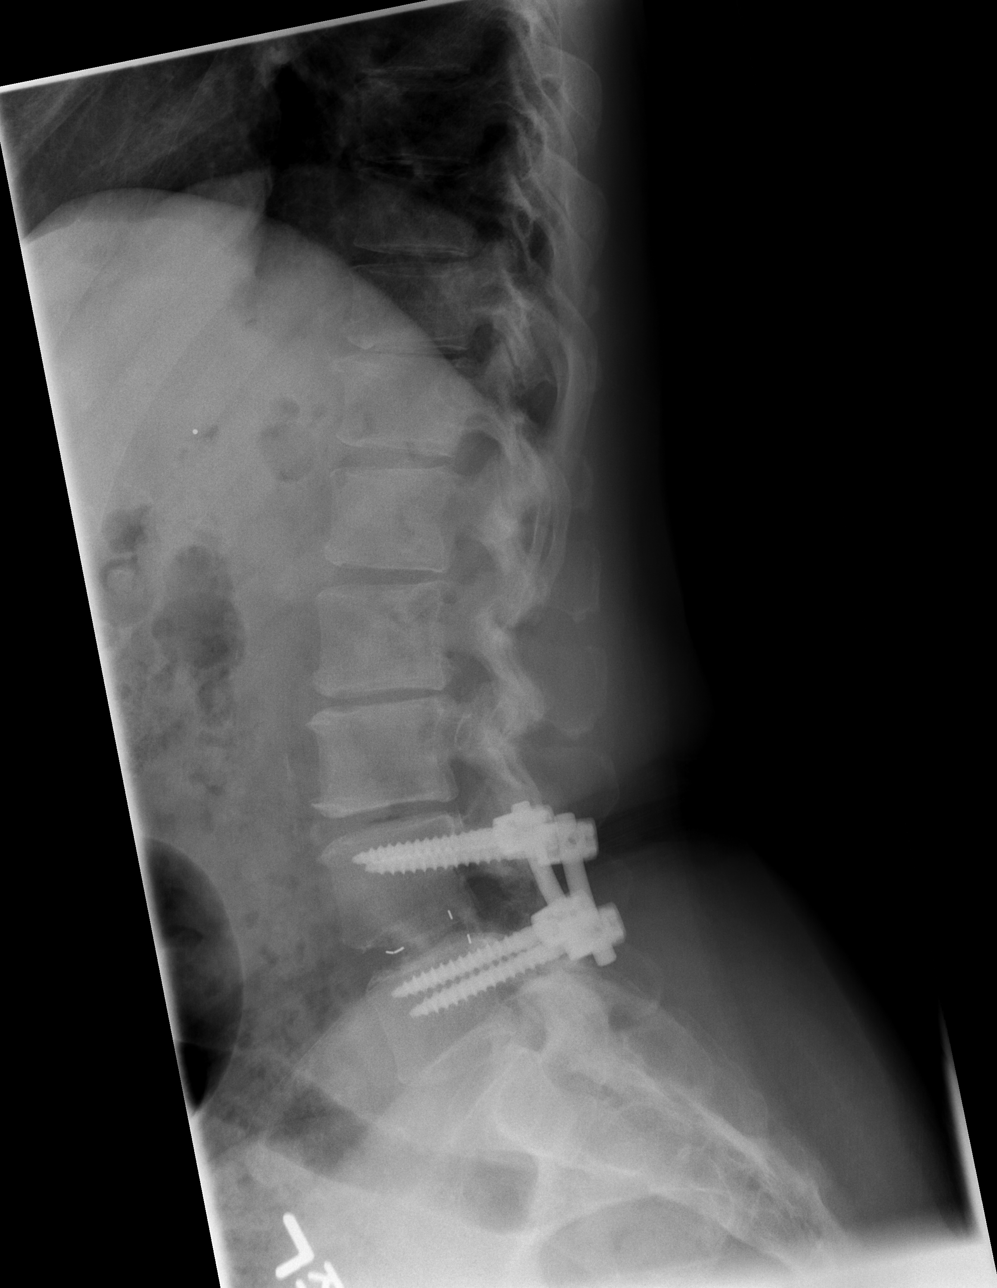

[t l-spine l5-s1 spot]
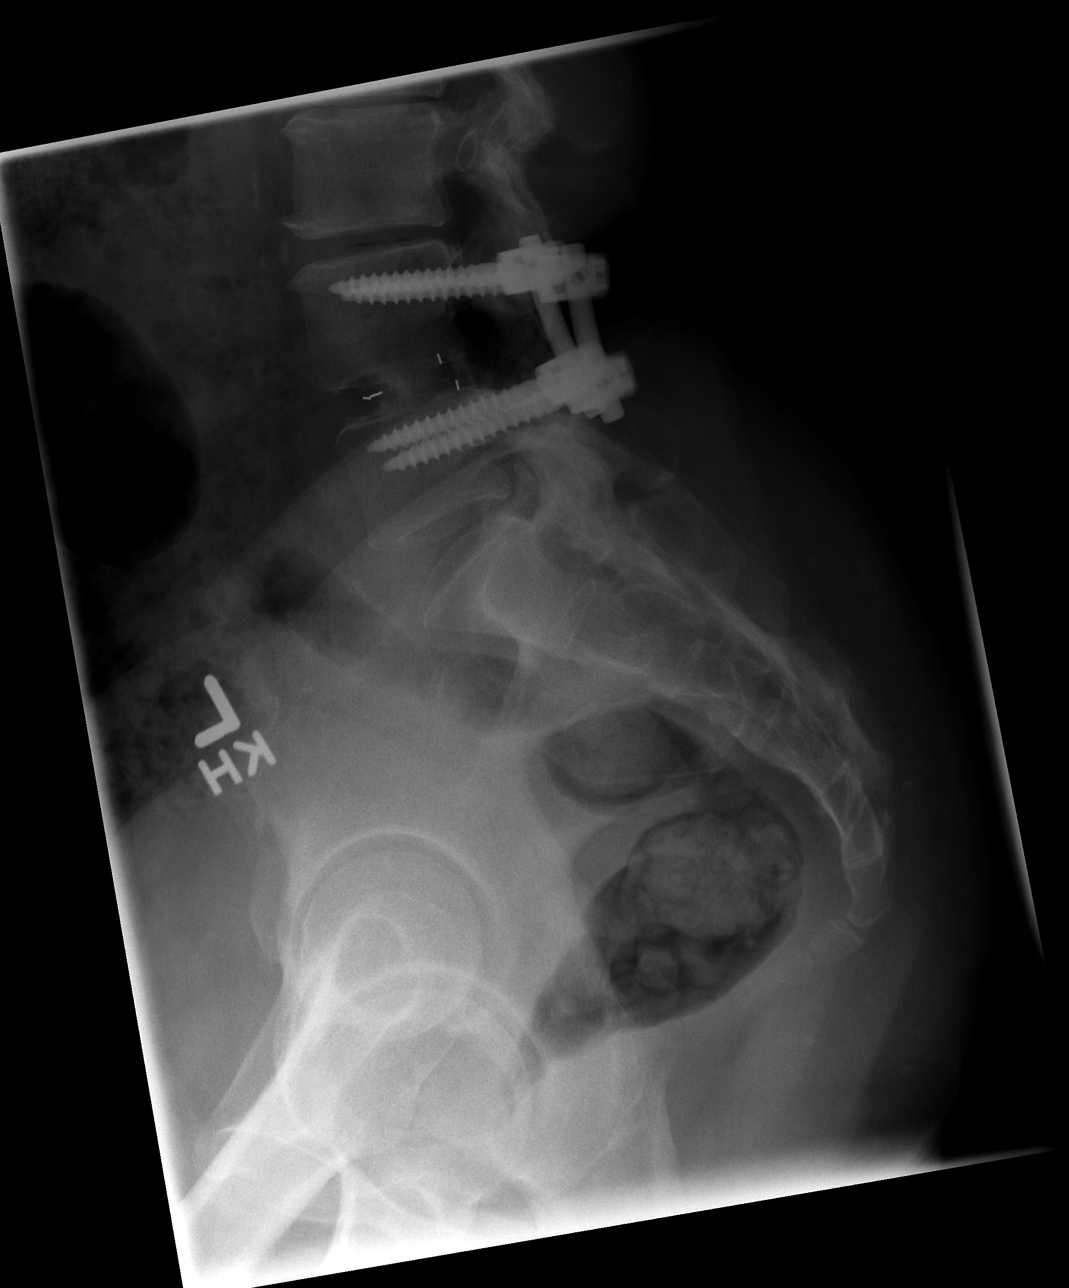

[5 of 5 positions shown; findings below may reference images not displayed]

FINDINGS: There is no evidence of lumbar spine fracture. Alignment is normal.
Prior posterior fusion of L4-L5 noted. Mild degenerative joint
changes with anterior osteophytosis are identified in the lumbar
spine.
IMPRESSION: No acute fracture or dislocation.

## 2018-06-29 IMAGING — CR DG HIP (WITH OR WITHOUT PELVIS) 2-3V*L*
3 series · 3 of 3 positions shown · non-contrast
Comparison: None.

CLINICAL DATA: Status post fall with left hip pain.

EXAM:
DG HIP (WITH OR WITHOUT PELVIS) 2-3V LEFT

[t pelvis a.p.]
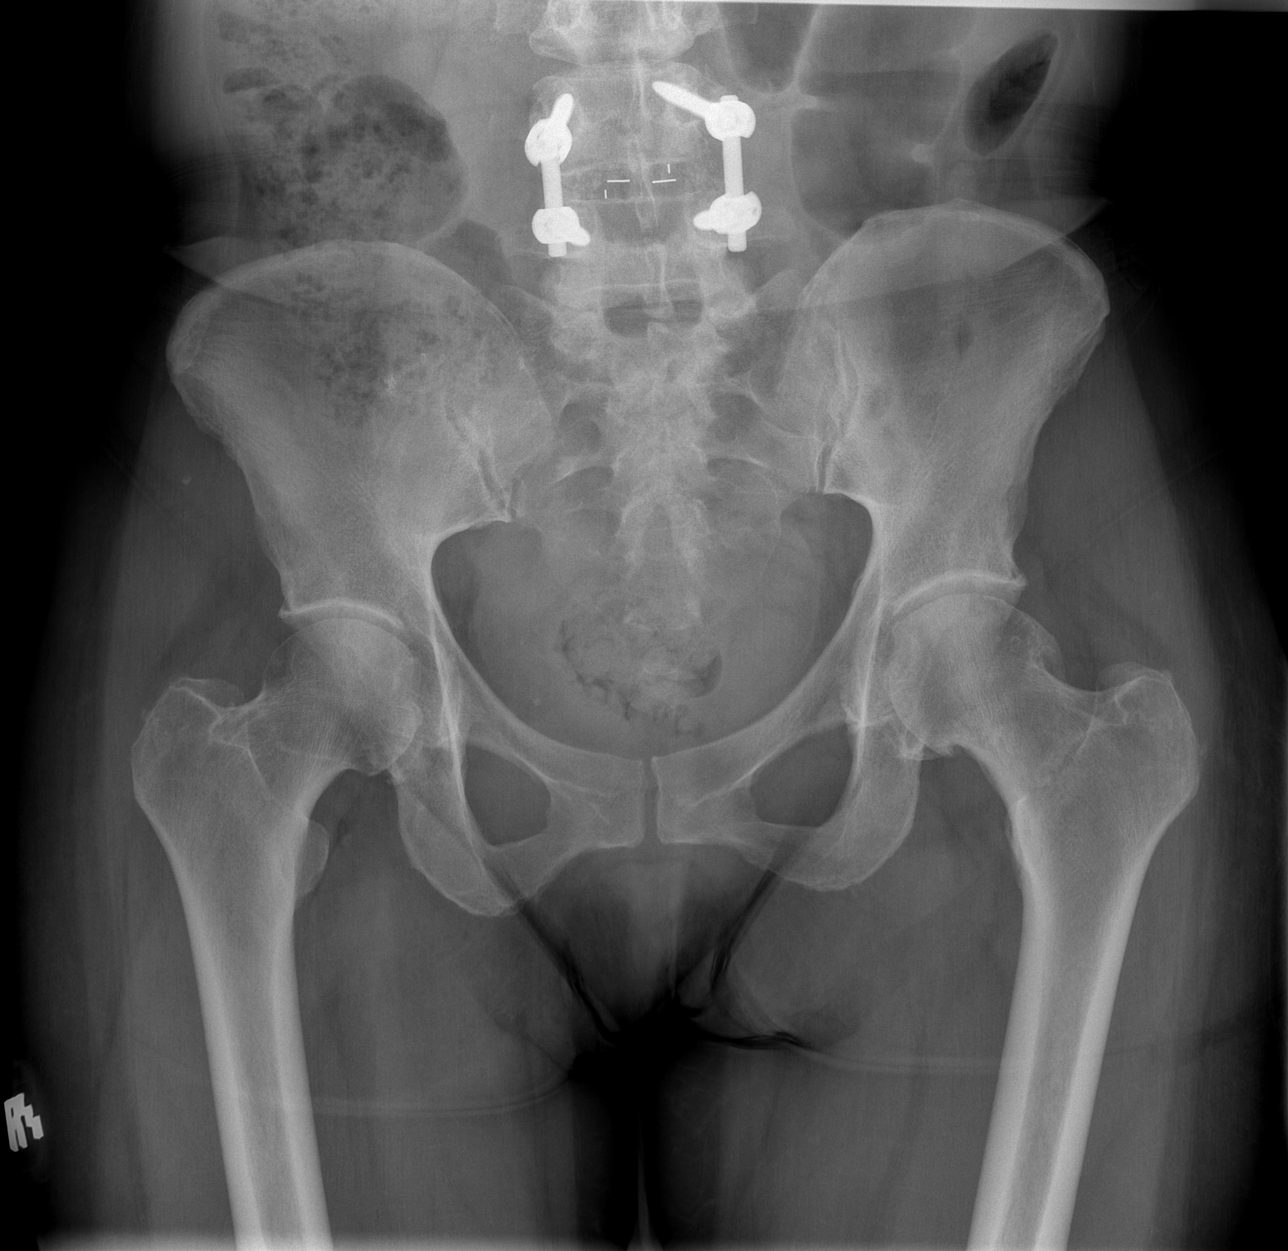

[t hip ap left]
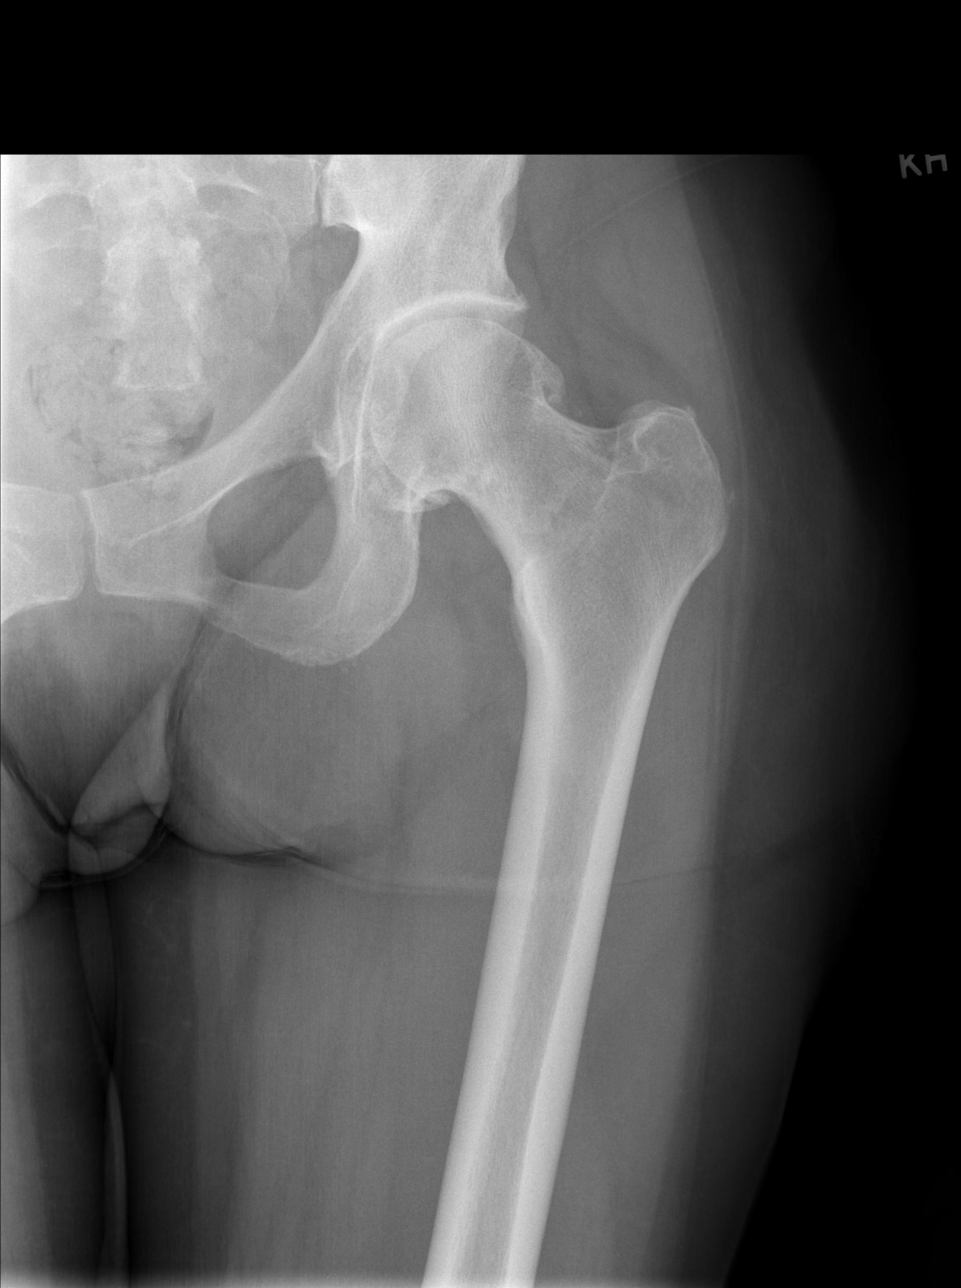

[t hip frog leg left]
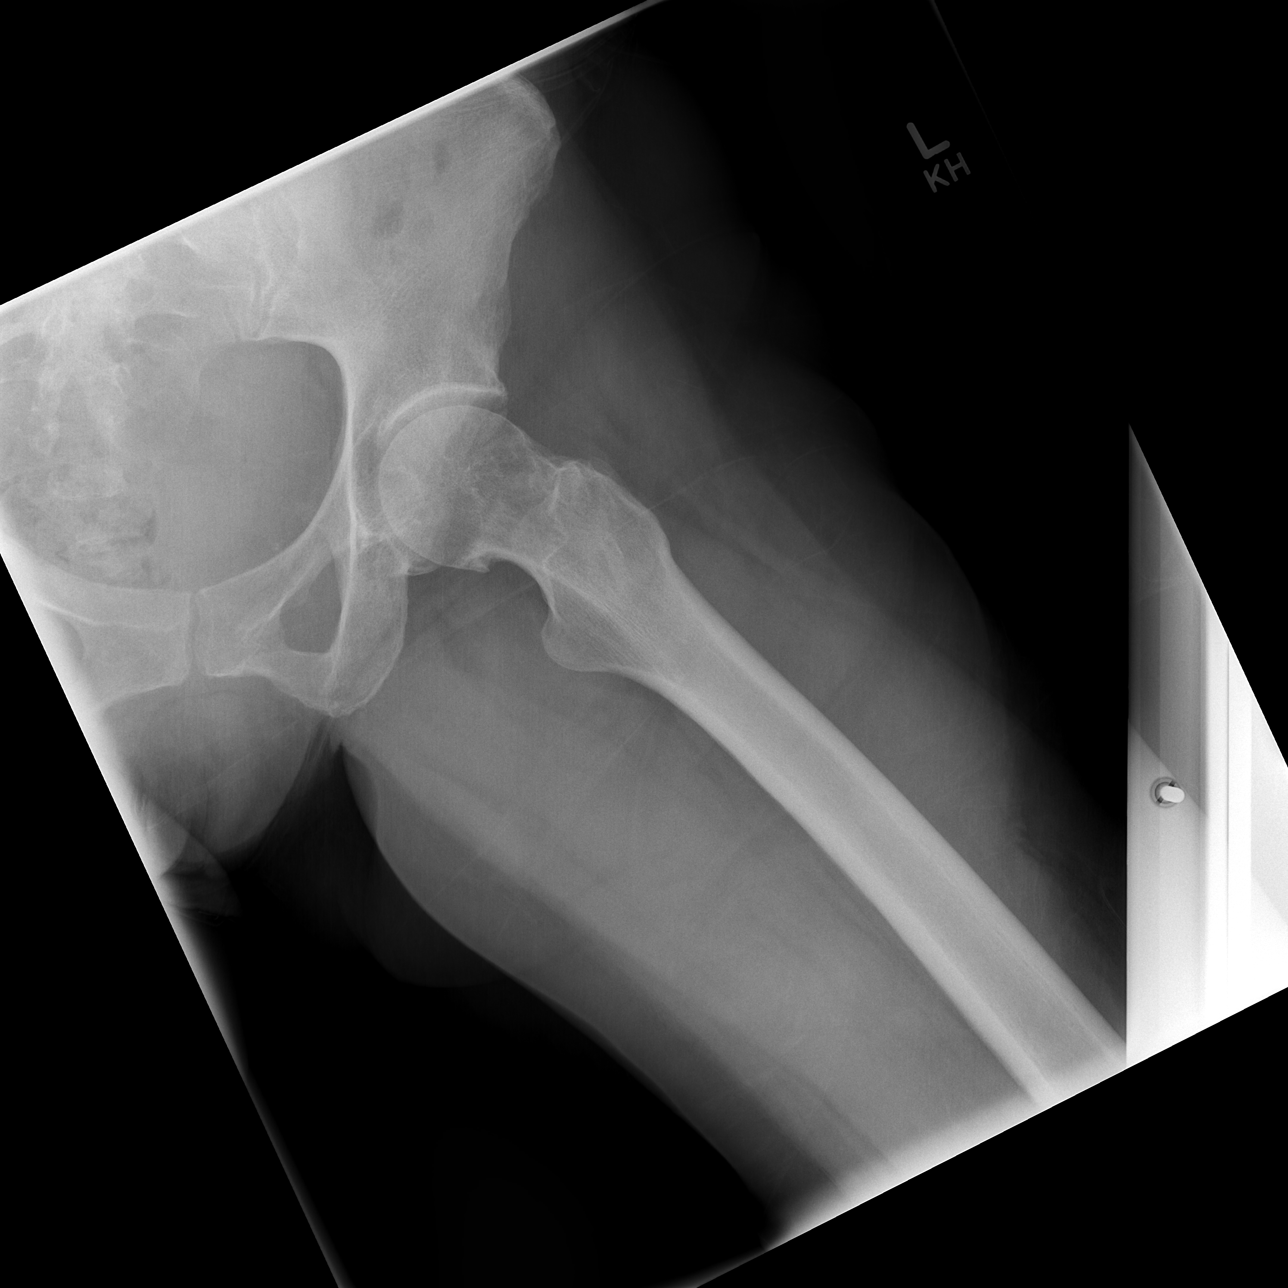

[3 of 3 positions shown; findings below may reference images not displayed]

FINDINGS: There is no evidence of hip fracture or dislocation. Osteophytosis
of the proximal left femur are noted. Patient status post prior
fixation of lower lumbar spine.
IMPRESSION: No acute fracture or dislocation.

## 2019-02-04 ENCOUNTER — Emergency Department (HOSPITAL_BASED_OUTPATIENT_CLINIC_OR_DEPARTMENT_OTHER): Payer: Medicare Other

## 2019-02-04 ENCOUNTER — Emergency Department (HOSPITAL_BASED_OUTPATIENT_CLINIC_OR_DEPARTMENT_OTHER)
Admission: EM | Admit: 2019-02-04 | Discharge: 2019-02-04 | Disposition: A | Discharge status: Home / Self Care | Payer: Medicare Other | Attending: Emergency Medicine | Admitting: Emergency Medicine

## 2019-02-04 ENCOUNTER — Encounter (HOSPITAL_BASED_OUTPATIENT_CLINIC_OR_DEPARTMENT_OTHER): Payer: Self-pay | Admitting: *Deleted

## 2019-02-04 ENCOUNTER — Other Ambulatory Visit: Payer: Self-pay

## 2019-02-04 DIAGNOSIS — Y9301 Activity, walking, marching and hiking: Secondary | ICD-10-CM | POA: Diagnosis not present

## 2019-02-04 DIAGNOSIS — Z79899 Other long term (current) drug therapy: Secondary | ICD-10-CM | POA: Insufficient documentation

## 2019-02-04 DIAGNOSIS — J45909 Unspecified asthma, uncomplicated: Secondary | ICD-10-CM | POA: Diagnosis not present

## 2019-02-04 DIAGNOSIS — R002 Palpitations: Secondary | ICD-10-CM | POA: Insufficient documentation

## 2019-02-04 DIAGNOSIS — Z96651 Presence of right artificial knee joint: Secondary | ICD-10-CM | POA: Diagnosis not present

## 2019-02-04 DIAGNOSIS — W19XXXA Unspecified fall, initial encounter: Secondary | ICD-10-CM | POA: Insufficient documentation

## 2019-02-04 DIAGNOSIS — S8001XA Contusion of right knee, initial encounter: Secondary | ICD-10-CM | POA: Diagnosis not present

## 2019-02-04 DIAGNOSIS — Y998 Other external cause status: Secondary | ICD-10-CM | POA: Diagnosis not present

## 2019-02-04 DIAGNOSIS — S8991XA Unspecified injury of right lower leg, initial encounter: Secondary | ICD-10-CM | POA: Diagnosis present

## 2019-02-04 DIAGNOSIS — I10 Essential (primary) hypertension: Secondary | ICD-10-CM | POA: Diagnosis not present

## 2019-02-04 DIAGNOSIS — Y92017 Garden or yard in single-family (private) house as the place of occurrence of the external cause: Secondary | ICD-10-CM | POA: Diagnosis not present

## 2019-02-04 LAB — CBC WITH DIFFERENTIAL/PLATELET
Abs Immature Granulocytes: 0.01 10*3/uL (ref 0.00–0.07)
Basophils Absolute: 0 10*3/uL (ref 0.0–0.1)
Basophils Relative: 1 %
Eosinophils Absolute: 0.3 10*3/uL (ref 0.0–0.5)
Eosinophils Relative: 5 %
HCT: 35.9 % — ABNORMAL LOW (ref 36.0–46.0)
Hemoglobin: 11.5 g/dL — ABNORMAL LOW (ref 12.0–15.0)
Immature Granulocytes: 0 %
Lymphocytes Relative: 27 %
Lymphs Abs: 1.7 10*3/uL (ref 0.7–4.0)
MCH: 30.5 pg (ref 26.0–34.0)
MCHC: 32 g/dL (ref 30.0–36.0)
MCV: 95.2 fL (ref 80.0–100.0)
Monocytes Absolute: 0.4 10*3/uL (ref 0.1–1.0)
Monocytes Relative: 7 %
Neutro Abs: 3.7 10*3/uL (ref 1.7–7.7)
Neutrophils Relative %: 60 %
Platelets: 224 10*3/uL (ref 150–400)
RBC: 3.77 MIL/uL — ABNORMAL LOW (ref 3.87–5.11)
RDW: 12.3 % (ref 11.5–15.5)
WBC: 6.2 10*3/uL (ref 4.0–10.5)
nRBC: 0 % (ref 0.0–0.2)

## 2019-02-04 LAB — COMPREHENSIVE METABOLIC PANEL
ALT: 15 U/L (ref 0–44)
AST: 20 U/L (ref 15–41)
Albumin: 3.6 g/dL (ref 3.5–5.0)
Alkaline Phosphatase: 55 U/L (ref 38–126)
Anion gap: 6 (ref 5–15)
BUN: 10 mg/dL (ref 8–23)
CO2: 27 mmol/L (ref 22–32)
Calcium: 9.1 mg/dL (ref 8.9–10.3)
Chloride: 106 mmol/L (ref 98–111)
Creatinine, Ser: 0.83 mg/dL (ref 0.44–1.00)
GFR calc Af Amer: >60 mL/min (ref >60)
GFR calc non Af Amer: >60 mL/min (ref >60)
Glucose, Bld: 92 mg/dL (ref 70–99)
Potassium: 4.2 mmol/L (ref 3.5–5.1)
Sodium: 139 mmol/L (ref 135–145)
Total Bilirubin: 0.4 mg/dL (ref 0.3–1.2)
Total Protein: 6.8 g/dL (ref 6.5–8.1)

## 2019-02-04 LAB — URINALYSIS, MICROSCOPIC (REFLEX): RBC / HPF: NONE SEEN RBC/hpf (ref 0–5)

## 2019-02-04 LAB — URINALYSIS, ROUTINE W REFLEX MICROSCOPIC
Bilirubin Urine: NEGATIVE
Glucose, UA: NEGATIVE mg/dL
Hgb urine dipstick: NEGATIVE
Ketones, ur: NEGATIVE mg/dL
Nitrite: NEGATIVE
Protein, ur: NEGATIVE mg/dL
Specific Gravity, Urine: 1.01 (ref 1.005–1.030)
pH: 7.5 (ref 5.0–8.0)

## 2019-02-04 LAB — PROTIME-INR
INR: 0.9 (ref 0.8–1.2)
Prothrombin Time: 12.3 seconds (ref 11.4–15.2)

## 2019-02-04 LAB — CBG MONITORING, ED: Glucose-Capillary: 89 mg/dL (ref 70–99)

## 2019-02-04 LAB — MAGNESIUM: Magnesium: 2.1 mg/dL (ref 1.7–2.4)

## 2019-02-04 LAB — TSH: TSH: 1.224 u[IU]/mL (ref 0.350–4.500)

## 2019-02-04 MED ORDER — SODIUM CHLORIDE 0.9 % IV BOLUS
1000.0000 mL | Freq: Once | INTRAVENOUS | Status: AC
  Administered 2019-02-04: 1000 mL via INTRAVENOUS

## 2019-02-04 MED ORDER — SODIUM CHLORIDE 0.9 % IV SOLN
INTRAVENOUS | Status: DC
  Administered 2019-02-04: 21:00:00 via INTRAVENOUS

## 2019-02-04 MED ORDER — HYDROCODONE-ACETAMINOPHEN 5-325 MG PO TABS
1.0000 | ORAL_TABLET | Freq: Once | ORAL | Status: AC
  Administered 2019-02-04: 1 via ORAL
  Filled 2019-02-04: qty 1

## 2019-02-04 MED ORDER — HYDROCODONE-ACETAMINOPHEN 5-325 MG PO TABS: 1.0000 | ORAL_TABLET | ORAL | 0 refills | Status: DC | PRN

## 2019-02-04 NOTE — ED Provider Notes (Signed)
[facility] EMERGENCY DEPARTMENT Provider Note   CSN: [id number] Arrival date & time: 02/04/19  1841    History   Chief Complaint Chief Complaint  Patient presents with  . Fall    HPI April Hunt is a 68 y.o. female.     Pt presents to the ED today with a fall and palpitations.  The pt said she was walking out to her mailbox and fell after turning to go back to the house.  She has no idea why she fell.  The pt fell on the right side of her body.  She denies loc.  She has had palpitations since she fell, but did not feel them before she fell.  No sob or cough.     Past Medical History:  Diagnosis Date  . Allergy    seasonal  . Arthritis   . Asthma    SEASONAL  . Complication of anesthesia    hard to wake up  . Family history of anesthesia complication    BROTHER HAS HARD TIME WAKING UP  . GERD (gastroesophageal reflux disease)    OTC  . History of chicken pox   . History of shingles   . Hypertension   . PONV (postoperative nausea and vomiting)   . Positive PPD, treated   . Refusal of blood transfusions as patient is Jehovah's Witness     Patient Active Problem List   Diagnosis Date Noted  . Osteoarthritis of right knee 12/26/2013  . Headache 05/13/2013  . Sinusitis 03/04/2013  . Back pain 07/21/2012  . Routine general medical examination at a health care facility 03/19/2012  . Joint pain 03/19/2012  . Tinea versicolor 03/19/2012  . Tinea pedis 03/19/2012  . Otitis media 03/19/2012  . Neck pain 12/25/2011  . Asthma 12/25/2011  . 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 Elevated blood pressure reading without diagnosis of hypertension 12/25/2011    Past Surgical History:  Procedure Laterality Date  . ABDOMINAL HYSTERECTOMY  1998   Still has cervix and both ovaries  . APPENDECTOMY    . BACK SURGERY    . JOINT REPLACEMENT     scope bil knees  . KNEE ARTHROSCOPY  1997   right knee  . Lymph node resection  1999   benign  . MENISCUS  REPAIR  1995   left knee  . 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   Procedure: RIGHT TOTAL KNEE ARTHROPLASTY;  Surgeon: Thera Flake., MD;  Location: [facility] OR;  Service: Orthopedics;  Laterality: Right;     OB History   No obstetric history on file.      Home Medications    Prior to Admission medications   Medication Sig Start Date End Date Taking? Authorizing Provider  albuterol (VENTOLIN HFA) 108 (90 Base) MCG/ACT inhaler Inhale into the lungs. 06/09/16  Yes [provider]  Azelastine-Fluticasone 137-50 MCG/ACT SUSP Frequency:   Dosage:0   MCG/ACT  Instructions:  Note:Use one spray into each nostril twice a day for allergy. 10/26/12  Yes [provider]  B Complex Vitamins (VITAMIN B-COMPLEX) TABS Take by mouth.   Yes [provider]  b complex vitamins capsule Take 2 capsules by mouth daily.   Yes [provider]  benazepril-hydrochlorthiazide (LOTENSIN HCT) 20-25 MG tablet Take by mouth.   Yes [provider]  conjugated estrogens (PREMARIN) vaginal cream  01/12/17  Yes [provider]  estradiol (ESTRACE)  0.1 MG/GM vaginal cream Place vaginally. 04/08/18  Yes [provider]  fluocinonide (LIDEX) 0.05 % external solution APPLY SOLUTION TOPICALLY TO AFFECTED AREA TWICE DAILY 05/23/18  Yes [provider]  gabapentin (NEURONTIN) 100 MG capsule TAKE 1 TO 3 CAPSULES BY MOUTH ONCE DAILY IN THE MORNING AS TOLERATED AND 3 ONCE DAILY AT BEDTIME 08/11/15  Yes [provider]  ibuprofen (ADVIL) 800 MG tablet  12/21/16  Yes [provider]  ketoconazole (NIZORAL) 2 % cream APPLY CREAM TOPICALLY TO AFFECTED AREA TWICE DAILY 05/23/18  Yes [provider]  linaclotide (LINZESS) 145 MCG CAPS capsule Take by mouth. 10/04/18  Yes [provider]  nabumetone (RELAFEN) 500 MG tablet Take 1 tablet by mouth twice daily as needed for pain 11/16/14  Yes  [provider]  valACYclovir (VALTREX) 1000 MG tablet Take by mouth. 10/08/18  Yes [provider]  amoxicillin-clavulanate (AUGMENTIN) 875-125 MG tablet Take 1 tablet by mouth every 12 (twelve) hours. 11/03/15   [name], [name], PA-C  Ascorbic Acid (VITAMIN C) 1000 MG tablet Take 1,000 mg by mouth daily.    [provider]  benzonatate (TESSALON) 100 MG capsule Take 1 capsule (100 mg total) by mouth every 8 (eight) hours. 11/03/15   [name], [name], PA-C  BIOTIN PO Take 1 tablet by mouth daily.    [provider]  Calcium Carbonate-Vitamin D (CALCIUM-VITAMIN D) 600-200 MG-UNIT CAPS Take 4 capsules by mouth daily.    [provider]  Calcium Carbonate-Vitamin D 600-200 MG-UNIT CAPS Take by mouth.    [provider]  CINNAMON PO Take 2 tablets by mouth daily.    [provider]  cyclobenzaprine (FLEXERIL) 5 MG tablet Take 1 tablet (5 mg total) by mouth 3 (three) times daily as needed for muscle spasms. 02/23/18   Beaulah Dinning, MD  fexofenadine (ALLEGRA) 180 MG tablet Take 180 mg by mouth daily.    [provider]  fluticasone (FLONASE) 50 MCG/ACT nasal spray Place 2 sprays into the nose daily as needed. For allergies 05/10/13   Emilia Beck, PA-C  gabapentin (NEURONTIN) 400 MG capsule Take 400 mg by mouth 3 (three) times daily.    [provider]  guaiFENesin (MUCINEX) 600 MG 12 hr tablet Take 1 tablet (600 mg total) by mouth 2 (two) times daily. 11/03/15   [name], [name], PA-C  hydrochlorothiazide (MICROZIDE) 12.5 MG capsule Take 12.5 mg by mouth daily.    [provider]  HYDROcodone-acetaminophen (NORCO/VICODIN) 5-325 MG tablet Take 1 tablet by mouth every 4 (four) hours as needed. 02/04/19   Jacalyn Lefevre, MD  ibuprofen (ADVIL,MOTRIN) 800 MG tablet Take 1 tablet (800 mg total) by mouth every 8 (eight) hours as needed for moderate pain. 09/07/17   Fayrene Helper, PA-C  L-LYSINE PO Take 1 tablet by mouth daily.     [provider]  loratadine (CLARITIN) 10 MG tablet Take by mouth.    [provider]  Multiple Vitamins-Minerals (MULTIVITAMIN WITH MINERALS) tablet Take 2 tablets by mouth daily.    [provider]  Multiple Vitamins-Minerals (MULTIVITAMIN WITH MINERALS) tablet Take by mouth.    [provider]  nabumetone (RELAFEN) 500 MG tablet Take 500 mg by mouth daily as needed.    [provider]  NON FORMULARY Take 2-4 capsules by mouth daily. Rosemary capsule    [provider]  NON FORMULARY Take 2-4 capsules by mouth daily. Horsetail capsule    [provider]  NON FORMULARY  [provider]  ondansetron (ZOFRAN ODT) 4 MG disintegrating tablet Take 1 tablet (4 mg total) by mouth every 8 (eight) hours as needed for nausea or vomiting. 11/03/15   [name], [name], PA-C  triamcinolone (NASACORT AQ) 55 MCG/ACT AERO nasal inhaler Place 2 sprays into the nose daily. 11/03/15   [name], Hillard Danker, PA-C    Family History Family History  Problem Relation Age of Onset  . Hyperlipidemia Mother   . Hypertension Mother   . Diabetes Father   . Tuberculosis Father   . Heart disease Paternal Aunt   . Cancer Paternal Uncle        colon cancer  . Cancer Cousin        breast    Social History Social History   Tobacco Use  . Smoking status: Never Smoker  . Smokeless tobacco: Never Used  Substance Use Topics  . Alcohol use: Yes    Comment: rarely  . Drug use: No     Allergies   Aspirin; Codeine; and Other   Review of Systems Review of Systems  Cardiovascular: Positive for palpitations.  Musculoskeletal:       Right shoulder, right hip, right knee pain  All other systems reviewed and are negative.    Physical Exam Updated Vital Signs BP 133/75   Pulse (!) 59   Temp 98.6 F (37 C) (Oral)   Resp 17   Ht 5\' 5"  (1.651 m)   Wt 68.9 kg   SpO2 100%   BMI 25.29 kg/m   Physical Exam Vitals signs and nursing note reviewed.   Constitutional:      Appearance: Normal appearance.  HENT:     Head: Normocephalic and atraumatic.     Right Ear: External ear normal.     Left Ear: External ear normal.     Nose: Nose normal.     Mouth/Throat:     Mouth: Mucous membranes are moist.     Pharynx: Oropharynx is clear.  Eyes:     Extraocular Movements: Extraocular movements intact.     Conjunctiva/sclera: Conjunctivae normal.     Pupils: Pupils are equal, round, and reactive to light.  Neck:     Musculoskeletal: Normal range of motion and neck supple.  Cardiovascular:     Rate and Rhythm: Normal rate and regular rhythm.     Pulses: Normal pulses.     Heart sounds: Normal heart sounds.  Pulmonary:     Effort: Pulmonary effort is normal.     Breath sounds: Normal breath sounds.  Abdominal:     General: Abdomen is flat. Bowel sounds are normal.     Palpations: Abdomen is soft.  Musculoskeletal:     Right shoulder: She exhibits decreased range of motion.     Right hip: She exhibits tenderness.     Right knee: Tenderness found.  Neurological:     Mental Status: She is alert.      ED Treatments / Results  Labs (all labs ordered are listed, but only abnormal results are displayed) Labs Reviewed  CBC WITH DIFFERENTIAL/PLATELET - Abnormal; Notable for the following components:      Result Value   RBC 3.77 (*)    Hemoglobin 11.5 (*)    HCT 35.9 (*)    All other components within normal limits  URINALYSIS, ROUTINE W REFLEX MICROSCOPIC - Abnormal; Notable for the following components:   Leukocytes,Ua SMALL (*)    All other components within normal limits  URINALYSIS, MICROSCOPIC (REFLEX) - Abnormal;  Notable for the following components:   Bacteria, UA RARE (*)    All other components within normal limits  COMPREHENSIVE METABOLIC PANEL  PROTIME-INR  MAGNESIUM  TSH  CBG MONITORING, ED    EKG EKG Interpretation  Date/Time:  Tuesday Feb 04 2019 19:18:55 EDT Ventricular Rate:  76 PR Interval:    QRS  Duration: 91 QT Interval:  378 QTC Calculation: 425 R Axis:   63 Text Interpretation:  Sinus rhythm No significant change since last tracing Confirmed by Jacalyn LefevreHaviland, Joquan Lotz [phone]) on 02/04/2019 7:32:19 PM   Radiology Dg Chest 2 View  Result Date: 02/04/2019 CLINICAL DATA:  Pain status post fall EXAM: CHEST - 2 VIEW COMPARISON:  None. FINDINGS: The heart size and mediastinal contours are within normal limits. Both lungs are clear. The visualized skeletal structures are unremarkable. IMPRESSION: No active cardiopulmonary disease. Electronically Signed   By: Katherine Mantlehristopher  Green M.D.   On: 02/04/2019 20:37   Dg Shoulder Right  Result Date: 02/04/2019 CLINICAL DATA:  Pain status post fall EXAM: RIGHT SHOULDER - 2+ VIEW COMPARISON:  None. FINDINGS: There is no acute displaced fracture or dislocation. There is no radiopaque foreign body. There are degenerative changes of the right glenohumeral joint. IMPRESSION: 1. No acute displaced fracture or dislocation. 2. Degenerative changes are noted of the right glenohumeral joint. Electronically Signed   By: Katherine Mantlehristopher  Green M.D.   On: 02/04/2019 20:38   Ct Head Wo Contrast  Result Date: 02/04/2019 CLINICAL DATA:  Pain status post fall EXAM: CT HEAD WITHOUT CONTRAST TECHNIQUE: Contiguous axial images were obtained from the base of the skull through the vertex without intravenous contrast. COMPARISON:  None. FINDINGS: Brain: No evidence of acute infarction, hemorrhage, hydrocephalus, extra-axial collection or mass lesion/mass effect. Vascular: No hyperdense vessel or unexpected calcification. Skull: Normal. Negative for fracture or focal lesion. Sinuses/Orbits: No acute finding. Other: None. IMPRESSION: No acute intracranial abnormality detected. Electronically Signed   By: Katherine Mantlehristopher  Green M.D.   On: 02/04/2019 20:42   Dg Knee Complete 4 Views Right  Result Date: 02/04/2019 CLINICAL DATA:  Right knee pain status post fall EXAM: RIGHT KNEE - COMPLETE 4+ VIEW  COMPARISON:  04/05/2019 FINDINGS: The patient is status post prior total knee arthroplasty. The hardware is intact. There is no periprosthetic fracture. No dislocation. No significant joint effusion. IMPRESSION: No acute displaced fracture or dislocation. Electronically Signed   By: Katherine Mantlehristopher  Green M.D.   On: 02/04/2019 20:39   Dg Hip Unilat W Or Wo Pelvis 2-3 Views Right  Result Date: 02/04/2019 CLINICAL DATA:  68 y/o F; fall landing on right side. Pain in right shoulder, right hip, right knee. EXAM: DG HIP (WITH OR WITHOUT PELVIS) 2-3V RIGHT COMPARISON:  None. FINDINGS: There is no evidence of hip fracture or dislocation. There is no evidence of arthropathy or other focal bone abnormality. IMPRESSION: Negative. Electronically Signed   By: Mitzi HansenLance  Furusawa-Stratton M.D.   On: 02/04/2019 20:40    Procedures Procedures (including critical care time)  Medications Ordered in ED Medications  sodium chloride 0.9 % bolus 1,000 mL (0 mLs Intravenous Stopped 02/04/19 2204)    And  0.9 %  sodium chloride infusion ( Intravenous New Bag/Given 02/04/19 2047)  HYDROcodone-acetaminophen (NORCO/VICODIN) 5-325 MG per tablet 1 tablet (has no administration in time range)     Initial Impression / Assessment and Plan / ED Course  I have reviewed the triage vital signs and the nursing notes.  Pertinent labs & imaging results that were available during my care  of the patient were reviewed by me and considered in my medical decision making (see chart for details).       Pt has been in NSR on the monitor.  Labs/CT/xrays reviewed.  Pt's main complaint is her right knee pain from the fall.  She is instructed to return if worse and to f/u with pcp.  Final Clinical Impressions(s) / ED Diagnoses   Final diagnoses:  Fall, initial encounter  Contusion of right knee, initial encounter    ED Discharge Orders         Ordered    HYDROcodone-acetaminophen (NORCO/VICODIN) 5-325 MG tablet  Every 4 hours PRN      02/04/19 2204           Jacalyn Lefevre, MD 02/04/19 2205

## 2019-02-04 NOTE — ED Notes (Signed)
Pt. Reports she fell on her R side causing her to injure her R hip and R arm today at approx. 3pm

## 2019-02-04 NOTE — ED Triage Notes (Signed)
She fell while going to the mailbox. She is not sure why she fell. Injury to the right side of her body. Ambulatory, alert and oriented.

## 2019-02-04 NOTE — ED Notes (Signed)
Pt. Is SR on monitor with no ectopy noted.  Pt. Reports she has feelings of "flutters" off and on.

## 2019-02-04 NOTE — ED Notes (Signed)
Pt on monitor 

## 2019-02-26 ENCOUNTER — Telehealth: Payer: Self-pay | Admitting: *Deleted

## 2019-02-26 NOTE — Telephone Encounter (Signed)
REFERRAL SENT TO SCHEDULING AND NOTES ON FILE FROM [facility] [phone] PENNY JONES,FNP-C.

## 2019-05-26 NOTE — Progress Notes (Signed)
CARDIOLOGY CONSULT NOTE       Patient ID: April Hunt MRN: [id number] DOB/AGE: 02/18/1951 68 y.o.  Admit date: (Not on file) Referring Physician: Primary below Primary Physician: [name], NP Primary Cardiologist: New Reason for Consultation: Palpitations , abnormal ECG  Active Problems:   * No active hospital problems. *   HPI:  68 y.o. no previous cardiac history. HTN. Jehovah's Witness no blood products Seen in ER 02/04/19 after what was likely a mechanical fall. Started having palpitations after fall but not before No chest pain , syncope or dyspnea Right hip,knee and shoulder xrays negative Monitor with NSR no arrhythmias ECG 02/05/19 SR rate 72 normal Labs ok including TSH She has had epidural injections for lower back pain Previous L45 fusion Right TKR in 2015 Also take gabapentin and flexeril   She is retired Use to work for [place] at [employer]. She cares for her 17 yo mother at home Gets occasional rapid palpitations lasts seconds maybe every 66 weeks Exertional dyspnea Also with atypical pain in neck radiating down left arm This is non anginal sharp and non exertional  BP up today but usually fine at home on ACE/HCTZ  ROS All other systems reviewed and negative except as noted above  Past Medical History:  Diagnosis Date  . Allergy    seasonal  . Arthritis   . Asthma 12/25/2011  . Back pain 07/21/2012  . Complication of anesthesia    hard to wake up  . Elevated blood pressure reading without diagnosis of hypertension 12/25/2011  . Estrogen deficiency   . Family history of anesthesia complication    BROTHER HAS HARD TIME WAKING UP  . GERD (gastroesophageal reflux disease)    OTC  . Headache 05/13/2013  . History of chicken pox   . 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 History of shingles   . Hypertension   . Joint pain 03/19/2012  . Neck pain 12/25/2011  . Osteoarthritis of right knee 12/26/2013  . Otitis media 03/19/2012  . Palpitations   . PONV (postoperative  nausea and vomiting)   . Positive PPD, treated   . Pure hypercholesterolemia   . Refusal of blood transfusions as patient is Jehovah's Witness   . Sinusitis 03/04/2013  . Tinea pedis 03/19/2012  . Tinea versicolor 03/19/2012    Family History  Problem Relation Age of Onset  . Hyperlipidemia Mother   . Hypertension Mother   . Diabetes Father   . Tuberculosis Father   . Heart disease Paternal Aunt   . Cancer Paternal Uncle        colon cancer  . Cancer Cousin        breast    Social History   Socioeconomic History  . Marital status: Divorced    Spouse name: Not on file  . Number of children: 1  . Years of education: Not on file  . Highest education level: Not on file  Occupational History  . Occupation: Magazine features editor: [employer]  Social Needs  . Financial resource strain: Not on file  . Food insecurity    Worry: Not on file    Inability: Not on file  . Transportation needs    Medical: Not on file    Non-medical: Not on file  Tobacco Use  . Smoking status: Never Smoker  . Smokeless tobacco: Never Used  Substance and Sexual Activity  . Alcohol use: Yes    Comment: rarely  . Drug use: No  .  Sexual activity: Not on file  Lifestyle  . Physical activity    Days per week: Not on file    Minutes per session: Not on file  . Stress: Not on file  Relationships  . Social Herbalist on phone: Not on file    Gets together: Not on file    Attends religious service: Not on file    Active member of club or organization: Not on file    Attends meetings of clubs or organizations: Not on file    Relationship status: Not on file  . Intimate partner violence    Fear of current or ex partner: Not on file    Emotionally abused: Not on file    Physically abused: Not on file    Forced sexual activity: Not on file  Other Topics Concern  . Not on file  Social History Narrative   Caffeine Use: "all the time"   Regular exercise:  Walks daily.   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   Divorced   She has one son age 81.   Lives with her mother and her dog Elisabeth Cara- brittany spaniel    Past Surgical History:  Procedure Laterality Date  . ABDOMINAL HYSTERECTOMY  1998   Still has cervix and both ovaries  . APPENDECTOMY    . BACK SURGERY    . JOINT REPLACEMENT     scope bil knees  . KNEE ARTHROSCOPY  1997   right knee  . Lymph node resection  1999   benign  . MENISCUS REPAIR  1995   left knee  . 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   Procedure: RIGHT TOTAL KNEE ARTHROPLASTY;  Surgeon: Yvette Rack., MD;  Location: [facility];  Service: Orthopedics;  Laterality: Right;        Physical Exam: Height 5\' 5"  (1.651 m), weight 150 lb (68 kg).   Affect appropriate Healthy:  appears stated age 12: normal Neck supple with no adenopathy JVP normal no bruits no thyromegaly Lungs clear with no wheezing and good diaphragmatic motion Heart:  S1/S2 no murmur, no rub, gallop or click PMI normal Abdomen: benighn, BS positve, no tenderness, no AAA no bruit.  No HSM or HJR Distal pulses intact with no bruits No edema Neuro non-focal Skin warm and dry Post lumbar fusion and right TKR    Labs:   Lab Results  Component Value Date   WBC 6.2 02/04/2019   HGB 11.5 (L) 02/04/2019   HCT 35.9 (L) 02/04/2019   MCV 95.2 02/04/2019   PLT 224 02/04/2019   No results for input(s): NA, K, CL, CO2, BUN, CREATININE, CALCIUM, PROT, BILITOT, ALKPHOS, ALT, AST, GLUCOSE in the last 168 hours.  Invalid input(s): LABALBU No results found for: CKTOTAL, CKMB, CKMBINDEX, TROPONINI  Lab Results  Component Value Date   CHOL 209 (H) 03/19/2012   Lab Results  Component Value Date   HDL 74 03/19/2012   Lab Results  Component Value Date   LDLCALC 121 (H) 03/19/2012   Lab Results  Component Value Date   TRIG 70 03/19/2012   Lab Results  Component Value Date   CHOLHDL 2.8  03/19/2012   No results found for: LDLDIRECT    Radiology: No results found.  EKG: See HPI normal SR rate 72 May 2020   ASSESSMENT AND PLAN:   1.  Palpitaitons:  Benign sounding f/u Zio  monitor  2.  HTN:  Low sodium diet current meds f/u primary if more elevated maximize ACE dose or add norvasc  3. Back Pain:  F/u Babtst flexeral gabapentin previous epidural injections suspect her neck and arm pain are more radiculopathy 4. Dyspnea normal exam given above complaints will order echo   F/U PRN if monitor and echo ok   Signed: Charlton Hawseter Janiaya Ryser 06/05/2019, 8:13 AM

## 2019-06-05 ENCOUNTER — Encounter: Payer: Self-pay | Admitting: Cardiovascular Disease

## 2019-06-05 ENCOUNTER — Ambulatory Visit (INDEPENDENT_AMBULATORY_CARE_PROVIDER_SITE_OTHER): Payer: Medicare Other | Admitting: Cardiovascular Disease

## 2019-06-05 ENCOUNTER — Other Ambulatory Visit: Payer: Self-pay

## 2019-06-05 VITALS — BP 160/96 | HR 66 | Ht 65.0 in | Wt 150.0 lb

## 2019-06-05 DIAGNOSIS — R0600 Dyspnea, unspecified: Secondary | ICD-10-CM

## 2019-06-05 DIAGNOSIS — R002 Palpitations: Secondary | ICD-10-CM

## 2019-06-05 NOTE — Patient Instructions (Signed)
Medication Instructions:   If you need a refill on your cardiac medications before your next appointment, please call your pharmacy.   Lab work:  If you have labs (blood work) drawn today and your tests are completely normal, you will receive your results only by: Marland Kitchen MyChart Message (if you have MyChart) OR . A paper copy in the mail If you have any lab test that is abnormal or we need to change your treatment, we will call you to review the results.  Testing/Procedures: Your physician has requested that you have an echocardiogram. Echocardiography is a painless test that uses sound waves to create images of your heart. It provides your doctor with information about the size and shape of your heart and how well your heart's chambers and valves are working. This procedure takes approximately one hour. There are no restrictions for this procedure.  Your physician has recommended that you wear a Zio patch for 2 weeks. Zio monitors are medical devices that record the heart's electrical activity. Doctors most often use these monitors to diagnose arrhythmias. Arrhythmias are problems with the speed or rhythm of the heartbeat. The monitor is a small, portable device. You can wear one while you do your normal daily activities. This is usually used to diagnose what is causing palpitations/syncope (passing out).  Follow-Up: At [facility], you and your health needs are our priority.  As part of our continuing mission to provide you with exceptional heart care, we have created designated Provider Care Teams.  These Care Teams include your primary Cardiologist (physician) and Advanced Practice Providers (APPs -  Physician Assistants and Nurse Practitioners) who all work together to provide you with the care you need, when you need it. You will need a follow up appointment as needed with Dr. Johnsie Cancel.

## 2019-06-10 IMAGING — DX DG HIP (WITH OR WITHOUT PELVIS) 2-3V RIGHT
3 series · 3 of 3 positions shown · non-contrast
Comparison: None.

CLINICAL DATA: 67 y/o F; fall landing on right side. Pain in right
shoulder, right hip, right knee.

EXAM:
DG HIP (WITH OR WITHOUT PELVIS) 2-3V RIGHT

[pelvis ap]
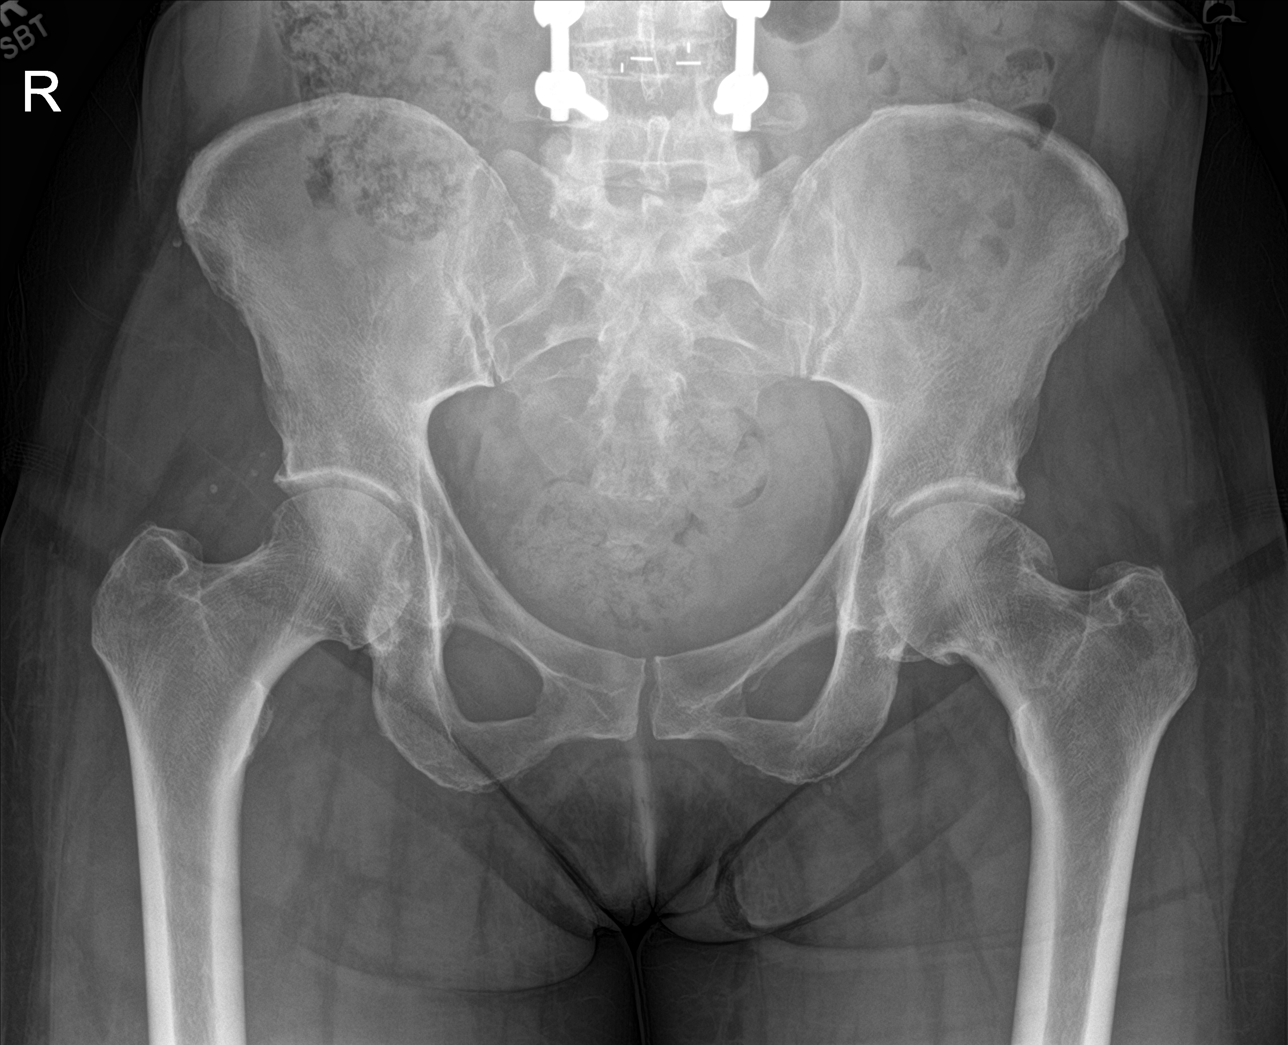

[hip ap]
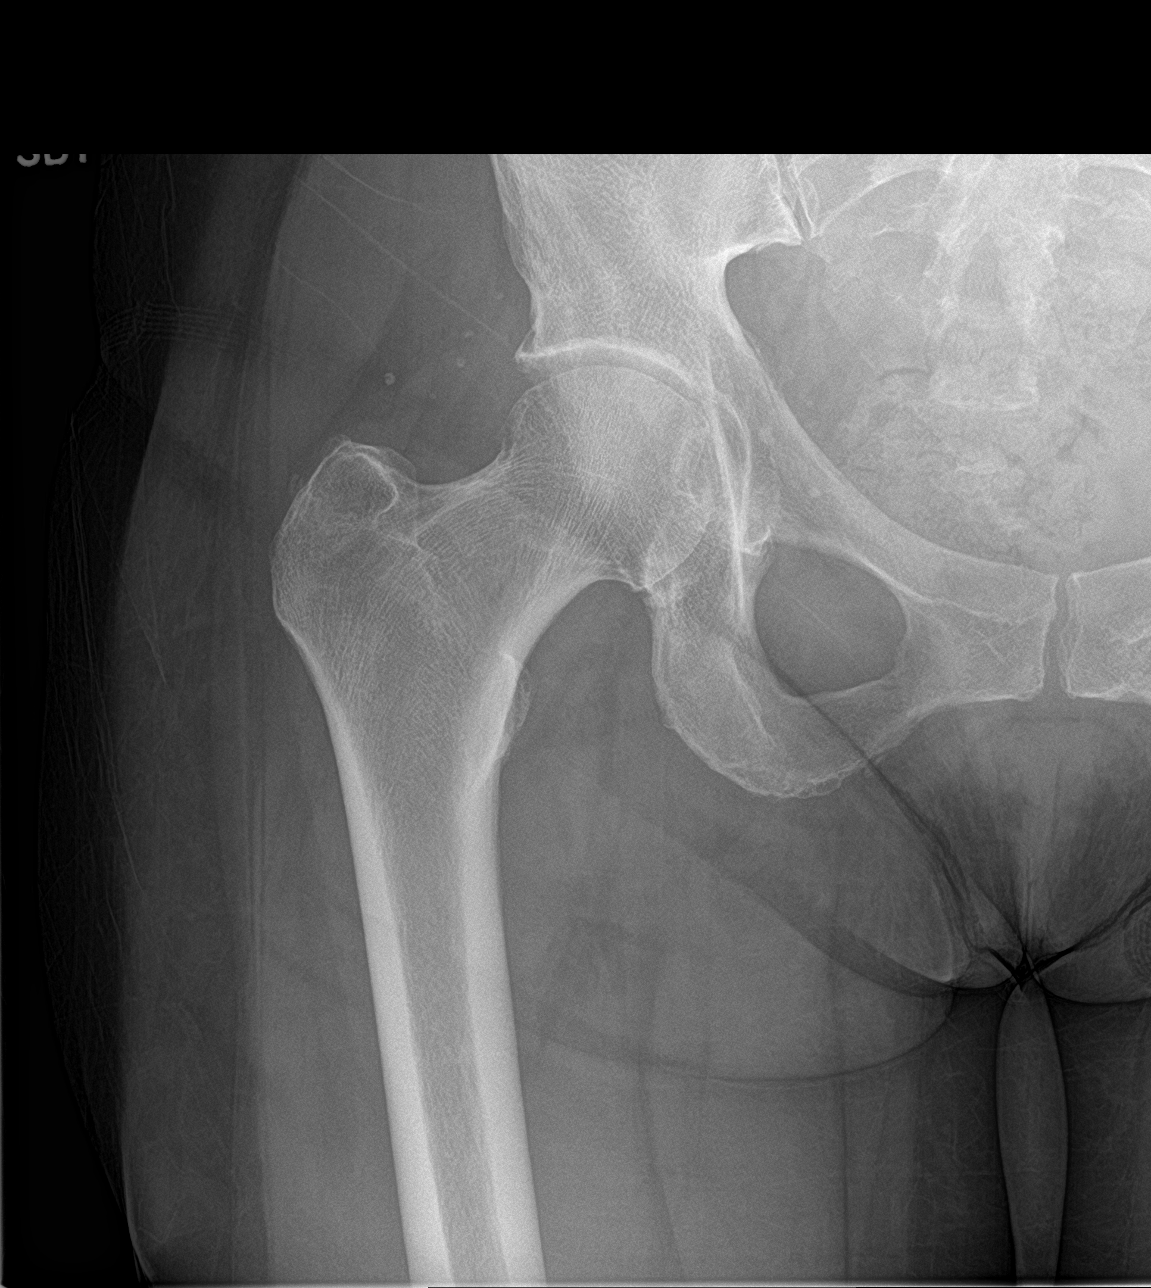

[hip lat]
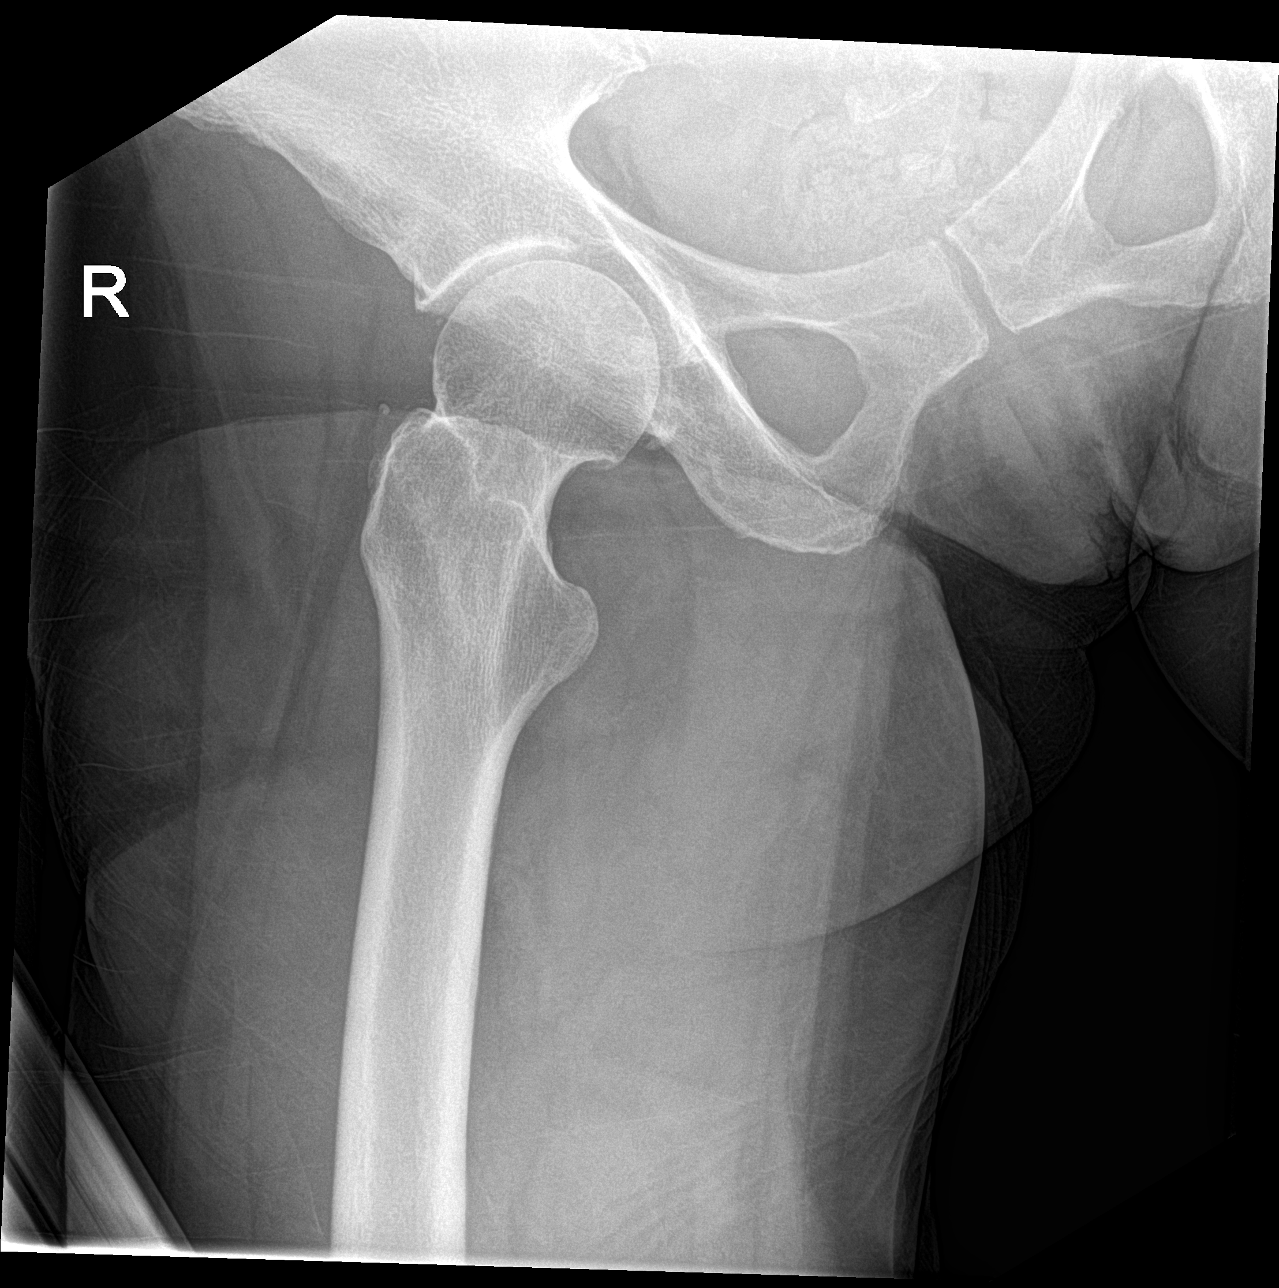

[3 of 3 positions shown; findings below may reference images not displayed]

FINDINGS: There is no evidence of hip fracture or dislocation. There is no
evidence of arthropathy or other focal bone abnormality.
IMPRESSION: Negative.

## 2019-06-11 ENCOUNTER — Other Ambulatory Visit: Payer: Self-pay

## 2019-06-11 ENCOUNTER — Ambulatory Visit (HOSPITAL_COMMUNITY): Payer: Medicare Other | Attending: Cardiology

## 2019-06-11 ENCOUNTER — Telehealth: Payer: Self-pay

## 2019-06-11 DIAGNOSIS — R002 Palpitations: Secondary | ICD-10-CM

## 2019-06-11 DIAGNOSIS — R0600 Dyspnea, unspecified: Secondary | ICD-10-CM | POA: Diagnosis present

## 2019-06-11 NOTE — Telephone Encounter (Signed)
Spoke to pt. Went over brief instructions for her monitor. Verified address. Ordered 14 day Zio to be delivered to her home.

## 2019-06-14 ENCOUNTER — Emergency Department (HOSPITAL_BASED_OUTPATIENT_CLINIC_OR_DEPARTMENT_OTHER)
Admission: EM | Admit: 2019-06-14 | Discharge: 2019-06-14 | Disposition: A | Discharge status: Home / Self Care | Payer: Medicare Other | Attending: Emergency Medicine | Admitting: Emergency Medicine

## 2019-06-14 ENCOUNTER — Other Ambulatory Visit: Payer: Self-pay

## 2019-06-14 ENCOUNTER — Encounter (HOSPITAL_BASED_OUTPATIENT_CLINIC_OR_DEPARTMENT_OTHER): Payer: Self-pay | Admitting: Emergency Medicine

## 2019-06-14 DIAGNOSIS — Z881 Allergy status to other antibiotic agents status: Secondary | ICD-10-CM | POA: Insufficient documentation

## 2019-06-14 DIAGNOSIS — Z886 Allergy status to analgesic agent status: Secondary | ICD-10-CM | POA: Insufficient documentation

## 2019-06-14 DIAGNOSIS — Z885 Allergy status to narcotic agent status: Secondary | ICD-10-CM | POA: Insufficient documentation

## 2019-06-14 DIAGNOSIS — I10 Essential (primary) hypertension: Secondary | ICD-10-CM | POA: Insufficient documentation

## 2019-06-14 DIAGNOSIS — J45909 Unspecified asthma, uncomplicated: Secondary | ICD-10-CM | POA: Diagnosis not present

## 2019-06-14 DIAGNOSIS — M545 Low back pain, unspecified: Secondary | ICD-10-CM

## 2019-06-14 DIAGNOSIS — Z79899 Other long term (current) drug therapy: Secondary | ICD-10-CM | POA: Diagnosis not present

## 2019-06-14 MED ORDER — HYDROCODONE-ACETAMINOPHEN 5-325 MG PO TABS: 1.0000 | ORAL_TABLET | Freq: Four times a day (QID) | ORAL | 0 refills | Status: DC | PRN

## 2019-06-14 MED ORDER — METHYLPREDNISOLONE SODIUM SUCC 125 MG IJ SOLR
80.0000 mg | Freq: Once | INTRAMUSCULAR | Status: AC
  Administered 2019-06-14: 14:00:00 80 mg via INTRAMUSCULAR
  Filled 2019-06-14: qty 2

## 2019-06-14 MED ORDER — PREDNISONE 10 MG PO TABS: 20.0000 mg | ORAL_TABLET | Freq: Two times a day (BID) | ORAL | 0 refills | Status: DC

## 2019-06-14 NOTE — ED Provider Notes (Signed)
[facility] EMERGENCY DEPARTMENT Provider Note   CSN: [id number] Arrival date & time: 06/14/19  1247     History   Chief Complaint Chief Complaint  Patient presents with  . Back Pain    HPI Derrisha J Moster is a 68 y.o. female.     Patient is a 68 year old female with history of osteoarthritis, hypertension, and prior back surgery.  She presents today with complaints of back pain.  This has been occurring intermittently since her surgery 6 years ago.  She denies any specific injury or trauma.  She denies any weakness or numbness in her legs.  She denies any bowel or bladder complaints.  Her pain is worse when she changes position and leans forward.  The history is provided by the patient.    Past Medical History:  Diagnosis Date  . Allergy    seasonal  . Arthritis   . Asthma 12/25/2011  . Back pain 07/21/2012  . Complication of anesthesia    hard to wake up  . Elevated blood pressure reading without diagnosis of hypertension 12/25/2011  . Estrogen deficiency   . Family history of anesthesia complication    BROTHER HAS HARD TIME WAKING UP  . GERD (gastroesophageal reflux disease)    OTC  . Headache 05/13/2013  . History of chicken pox   . 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 History of shingles   . Hypertension   . Joint pain 03/19/2012  . Neck pain 12/25/2011  . Osteoarthritis of right knee 12/26/2013  . Otitis media 03/19/2012  . Palpitations   . PONV (postoperative nausea and vomiting)   . Positive PPD, treated   . Pure hypercholesterolemia   . Refusal of blood transfusions as patient is Jehovah's Witness   . Sinusitis 03/04/2013  . Tinea pedis 03/19/2012  . Tinea versicolor 03/19/2012    Patient Active Problem List   Diagnosis Date Noted  . Osteoarthritis of right knee 12/26/2013  . Headache 05/13/2013  . Sinusitis 03/04/2013  . Back pain 07/21/2012  . Routine general medical examination at a health care facility 03/19/2012  . Joint pain 03/19/2012  .  Tinea versicolor 03/19/2012  . Tinea pedis 03/19/2012  . Otitis media 03/19/2012  . Neck pain 12/25/2011  . Asthma 12/25/2011  . 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 Elevated blood pressure reading without diagnosis of hypertension 12/25/2011    Past Surgical History:  Procedure Laterality Date  . ABDOMINAL HYSTERECTOMY  1998   Still has cervix and both ovaries  . APPENDECTOMY    . BACK SURGERY    . JOINT REPLACEMENT     scope bil knees  . KNEE ARTHROSCOPY  1997   right knee  . Lymph node resection  1999   benign  . MENISCUS REPAIR  1995   left knee  . 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   Procedure: RIGHT TOTAL KNEE ARTHROPLASTY;  Surgeon: Thera Flake., MD;  Location: [facility] OR;  Service: Orthopedics;  Laterality: Right;     OB History   No obstetric history on file.      Home Medications    Prior to Admission medications   Medication Sig Start Date End Date Taking? Authorizing Provider  albuterol (VENTOLIN HFA) 108 (90 Base) MCG/ACT inhaler Inhale into the lungs. 06/09/16   [provider]  amoxicillin-clavulanate (AUGMENTIN) 875-125 MG tablet Take 1 tablet by mouth every 12 (twelve) hours. 11/03/15  [name], [name], PA-C  Ascorbic Acid (VITAMIN C) 1000 MG tablet Take 1,000 mg by mouth daily.    [provider]  Azelastine-Fluticasone 137-50 MCG/ACT SUSP Frequency:   Dosage:0   MCG/ACT  Instructions:  Note:Use one spray into each nostril twice a day for allergy. 10/26/12   [provider]  B Complex Vitamins (VITAMIN B-COMPLEX) TABS Take by mouth.    [provider]  b complex vitamins capsule Take 2 capsules by mouth daily.    [provider]  benazepril-hydrochlorthiazide (LOTENSIN HCT) 20-25 MG tablet Take by mouth.    [provider]  benzonatate (TESSALON) 100 MG capsule Take 1 capsule (100 mg total) by mouth every 8 (eight) hours. 11/03/15   [name], [name],  PA-C  BIOTIN PO Take 1 tablet by mouth daily.    [provider]  Calcium Carbonate-Vitamin D (CALCIUM-VITAMIN D) 600-200 MG-UNIT CAPS Take 4 capsules by mouth daily.    [provider]  Calcium Carbonate-Vitamin D 600-200 MG-UNIT CAPS Take by mouth.    [provider]  CINNAMON PO Take 2 tablets by mouth daily.    [provider]  conjugated estrogens (PREMARIN) vaginal cream  01/12/17   [provider]  cyclobenzaprine (FLEXERIL) 5 MG tablet Take 1 tablet (5 mg total) by mouth 3 (three) times daily as needed for muscle spasms. 02/23/18   Beaulah DinningGambino, Christina M, MD  estradiol (ESTRACE) 0.1 MG/GM vaginal cream Place vaginally. 04/08/18   [provider]  fexofenadine (ALLEGRA) 180 MG tablet Take 180 mg by mouth daily.    [provider]  fluocinonide (LIDEX) 0.05 % external solution APPLY SOLUTION TOPICALLY TO AFFECTED AREA TWICE DAILY 05/23/18   [provider]  fluticasone (FLONASE) 50 MCG/ACT nasal spray Place 2 sprays into the nose daily as needed. For allergies 05/10/13   Emilia BeckSzekalski, Kaitlyn, PA-C  gabapentin (NEURONTIN) 100 MG capsule TAKE 1 TO 3 CAPSULES BY MOUTH ONCE DAILY IN THE MORNING AS TOLERATED AND 3 ONCE DAILY AT BEDTIME 08/11/15   [provider]  gabapentin (NEURONTIN) 400 MG capsule Take 400 mg by mouth 3 (three) times daily.    [provider]  guaiFENesin (MUCINEX) 600 MG 12 hr tablet Take 1 tablet (600 mg total) by mouth 2 (two) times daily. 11/03/15   [name], [name], PA-C  hydrochlorothiazide (MICROZIDE) 12.5 MG capsule Take 12.5 mg by mouth daily.    [provider]  HYDROcodone-acetaminophen (NORCO/VICODIN) 5-325 MG tablet Take 1 tablet by mouth every 4 (four) hours as needed. 02/04/19   Jacalyn LefevreHaviland, Julie, MD  ibuprofen (ADVIL) 800 MG tablet  12/21/16   [provider]  ibuprofen (ADVIL,MOTRIN) 800 MG tablet Take 1 tablet (800 mg total) by mouth every 8 (eight) hours as needed for  moderate pain. 09/07/17   Fayrene Helperran, Bowie, PA-C  ketoconazole (NIZORAL) 2 % cream APPLY CREAM TOPICALLY TO AFFECTED AREA TWICE DAILY 05/23/18   [provider]  L-LYSINE PO Take 1 tablet by mouth daily.    [provider]  linaclotide Karlene Einstein(LINZESS) 145 MCG CAPS capsule Take by mouth. 10/04/18   [provider]  loratadine (CLARITIN) 10 MG tablet Take by mouth.    [provider]  Multiple Vitamins-Minerals (MULTIVITAMIN WITH MINERALS) tablet Take 2 tablets by mouth daily.    [provider]  Multiple Vitamins-Minerals (MULTIVITAMIN WITH MINERALS) tablet Take by mouth.    [provider]  nabumetone (RELAFEN) 500 MG tablet Take 500 mg by mouth daily as needed.  [provider]  nabumetone (RELAFEN) 500 MG tablet Take 1 tablet by mouth twice daily as needed for pain 11/16/14   [provider]  NON FORMULARY Take 2-4 capsules by mouth daily. Rosemary capsule    [provider]  NON FORMULARY Take 2-4 capsules by mouth daily. Horsetail capsule    [provider]  NON FORMULARY     [provider]  ondansetron (ZOFRAN ODT) 4 MG disintegrating tablet Take 1 tablet (4 mg total) by mouth every 8 (eight) hours as needed for nausea or vomiting. 11/03/15   [name], [name], PA-C  triamcinolone (NASACORT AQ) 55 MCG/ACT AERO nasal inhaler Place 2 sprays into the nose daily. 11/03/15   [name], Helane Gunther, PA-C  valACYclovir (VALTREX) 1000 MG tablet Take by mouth. 10/08/18   [provider]    Family History Family History  Problem Relation Age of Onset  . Hyperlipidemia Mother   . Hypertension Mother   . Diabetes Father   . Tuberculosis Father   . Heart disease Paternal Aunt   . Cancer Paternal Uncle        colon cancer  . Cancer Cousin        breast    Social History Social History   Tobacco Use  . Smoking status: Never Smoker  . Smokeless tobacco: Never Used  Substance Use Topics  . Alcohol use: Yes     Comment: rarely  . Drug use: No     Allergies   Aspirin, Codeine, Doxycycline, and Other   Review of Systems Review of Systems  All other systems reviewed and are negative.    Physical Exam Updated Vital Signs BP 131/86 (BP Location: Left Arm)   Pulse 82   Temp 98.8 F (37.1 C) (Oral)   Resp 18   SpO2 100%   Physical Exam Vitals signs and nursing note reviewed.  Constitutional:      General: She is not in acute distress.    Appearance: Normal appearance. She is not ill-appearing, toxic-appearing or diaphoretic.  HENT:     Head: Normocephalic and atraumatic.  Pulmonary:     Effort: Pulmonary effort is normal.  Musculoskeletal: Normal range of motion.  Skin:    General: Skin is warm and dry.  Neurological:     General: No focal deficit present.     Mental Status: She is alert.     Comments: DTRs are 1+ and symmetrical in the patellar and Achilles tendons bilaterally.  Strength is 5 out of 5 in both lower extremities.  Patient is able to ambulate without significant difficulty.      ED Treatments / Results  Labs (all labs ordered are listed, but only abnormal results are displayed) Labs Reviewed - No data to display  EKG None  Radiology No results found.  Procedures Procedures (including critical care time)  Medications Ordered in ED Medications  methylPREDNISolone sodium succinate (SOLU-MEDROL) 125 mg/2 mL injection 80 mg (has no administration in time range)     Initial Impression / Assessment and Plan / ED Course  I have reviewed the triage vital signs and the nursing notes.  Pertinent labs & imaging results that were available during my care of the patient were reviewed by me and considered in my medical decision making (see chart for details).  Patient presenting with an exacerbation of low back pain.  She is neurologically intact and I see no indication for emergent imaging.  Patient will be treated with prednisone and pain medicine.  She is to  follow-up with her primary doctor/spine surgeon if she is not improving in the next few days.  Final Clinical Impressions(s) / ED Diagnoses   Final diagnoses:  None    ED Discharge Orders    None       Geoffery Lyons, MD 06/14/19 1351

## 2019-06-14 NOTE — Discharge Instructions (Addendum)
Begin taking prednisone as prescribed this afternoon.  Take hydrocodone as prescribed as needed for pain.  Follow-up with your primary doctor or spine surgeon if your symptoms or not improving in the next 3 to 4 days.

## 2019-06-14 NOTE — ED Triage Notes (Signed)
Chronic back pain at times since having back surgery, some years ago

## 2019-06-18 ENCOUNTER — Ambulatory Visit (INDEPENDENT_AMBULATORY_CARE_PROVIDER_SITE_OTHER): Payer: Medicare Other

## 2019-06-18 DIAGNOSIS — R0600 Dyspnea, unspecified: Secondary | ICD-10-CM

## 2019-06-18 DIAGNOSIS — R002 Palpitations: Secondary | ICD-10-CM

## 2021-02-21 ENCOUNTER — Emergency Department (HOSPITAL_BASED_OUTPATIENT_CLINIC_OR_DEPARTMENT_OTHER): Payer: Medicare Other

## 2021-02-21 ENCOUNTER — Emergency Department (HOSPITAL_BASED_OUTPATIENT_CLINIC_OR_DEPARTMENT_OTHER)
Admission: EM | Admit: 2021-02-21 | Discharge: 2021-02-21 | Disposition: A | Discharge status: Home / Self Care | Payer: Medicare Other | Attending: Emergency Medicine | Admitting: Emergency Medicine

## 2021-02-21 ENCOUNTER — Other Ambulatory Visit: Payer: Self-pay

## 2021-02-21 ENCOUNTER — Encounter (HOSPITAL_BASED_OUTPATIENT_CLINIC_OR_DEPARTMENT_OTHER): Payer: Self-pay

## 2021-02-21 DIAGNOSIS — W010XXA Fall on same level from slipping, tripping and stumbling without subsequent striking against object, initial encounter: Secondary | ICD-10-CM | POA: Diagnosis not present

## 2021-02-21 DIAGNOSIS — M25511 Pain in right shoulder: Secondary | ICD-10-CM | POA: Insufficient documentation

## 2021-02-21 DIAGNOSIS — Y92002 Bathroom of unspecified non-institutional (private) residence single-family (private) house as the place of occurrence of the external cause: Secondary | ICD-10-CM | POA: Diagnosis not present

## 2021-02-21 DIAGNOSIS — Z96641 Presence of right artificial hip joint: Secondary | ICD-10-CM | POA: Insufficient documentation

## 2021-02-21 DIAGNOSIS — J45909 Unspecified asthma, uncomplicated: Secondary | ICD-10-CM | POA: Diagnosis not present

## 2021-02-21 DIAGNOSIS — R202 Paresthesia of skin: Secondary | ICD-10-CM | POA: Diagnosis not present

## 2021-02-21 DIAGNOSIS — I10 Essential (primary) hypertension: Secondary | ICD-10-CM | POA: Diagnosis not present

## 2021-02-21 DIAGNOSIS — M25561 Pain in right knee: Secondary | ICD-10-CM | POA: Diagnosis not present

## 2021-02-21 DIAGNOSIS — M25512 Pain in left shoulder: Secondary | ICD-10-CM | POA: Diagnosis present

## 2021-02-21 DIAGNOSIS — Z96653 Presence of artificial knee joint, bilateral: Secondary | ICD-10-CM | POA: Diagnosis not present

## 2021-02-21 MED ORDER — METHOCARBAMOL 500 MG PO TABS: 500.0000 mg | ORAL_TABLET | Freq: Two times a day (BID) | ORAL | 0 refills | Status: DC

## 2021-02-21 NOTE — ED Provider Notes (Signed)
[facility] EMERGENCY DEPARTMENT Provider Note   CSN: [id number] Arrival date & time: 02/21/21  1538     History Chief Complaint  Patient presents with  . Fall    April Hunt is a 70 y.o. female presents for evaluation of bilateral shoulder pain, right knee pain after mechanical fall.  She reports that she lost her balance and tripped and fell in the bathroom.  She reports that she hit her left shoulder on the shelf and she tried to grab onto prevent himself from falling.  She she felt like it tore.  She states that then her right shoulder got wedged in between the tub and the toilet.  She reports occasionally, she will get a tingling sensation down her left arm.  She has not take any medication for pain.  She also reports landing on her right knee.  She has been able to ambulate and bear weight since this happened but does report pain to the right knee.  She states she did not hit her head or lose consciousness.  She is not currently on blood thinners.  The history is provided by the patient.       Past Medical History:  Diagnosis Date  . Allergy    seasonal  . Arthritis   . Asthma 12/25/2011  . Back pain 07/21/2012  . Complication of anesthesia    hard to wake up  . Elevated blood pressure reading without diagnosis of hypertension 12/25/2011  . Estrogen deficiency   . Family history of anesthesia complication    BROTHER HAS HARD TIME WAKING UP  . GERD (gastroesophageal reflux disease)    OTC  . Headache 05/13/2013  . History of chicken pox   . 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 History of shingles   . Hypertension   . Joint pain 03/19/2012  . Neck pain 12/25/2011  . Osteoarthritis of right knee 12/26/2013  . Otitis media 03/19/2012  . Palpitations   . PONV (postoperative nausea and vomiting)   . Positive PPD, treated   . Pure hypercholesterolemia   . Refusal of blood transfusions as patient is Jehovah's Witness   . Sinusitis 03/04/2013  . Tinea pedis 03/19/2012  .  Tinea versicolor 03/19/2012    Patient Active Problem List   Diagnosis Date Noted  . Osteoarthritis of right knee 12/26/2013  . Headache 05/13/2013  . Sinusitis 03/04/2013  . Back pain 07/21/2012  . Routine general medical examination at a health care facility 03/19/2012  . Joint pain 03/19/2012  . Tinea versicolor 03/19/2012  . Tinea pedis 03/19/2012  . Otitis media 03/19/2012  . Neck pain 12/25/2011  . Asthma 12/25/2011  . 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 Elevated blood pressure reading without diagnosis of hypertension 12/25/2011    Past Surgical History:  Procedure Laterality Date  . ABDOMINAL HYSTERECTOMY  1998   Still has cervix and both ovaries  . APPENDECTOMY    . BACK SURGERY    . JOINT REPLACEMENT     scope bil knees  . KNEE ARTHROSCOPY  1997   right knee  . Lymph node resection  1999   benign  . MENISCUS REPAIR  1995   left knee  . 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   Procedure: RIGHT TOTAL KNEE ARTHROPLASTY;  Surgeon: Thera FlakeW D Caffrey Jr., MD;  Location: [facility] OR;  Service: Orthopedics;  Laterality: Right;     OB History  No obstetric history on file.     Family History  Problem Relation Age of Onset  . Hyperlipidemia Mother   . Hypertension Mother   . Diabetes Father   . Tuberculosis Father   . Heart disease Paternal Aunt   . Cancer Paternal Uncle        colon cancer  . Cancer Cousin        breast    Social History   Tobacco Use  . Smoking status: Never Smoker  . Smokeless tobacco: Never Used  Substance Use Topics  . Alcohol use: Yes    Comment: rarely  . Drug use: No    Home Medications Prior to Admission medications   Medication Sig Start Date End Date Taking? Authorizing Provider  cycloSPORINE (RESTASIS) 0.05 % ophthalmic emulsion Place 1 drop into both eyes every 12 hours. 02/02/21 02/02/22 Yes [provider]  erythromycin ophthalmic ointment Place into both eyes  nightly. 02/02/21  Yes [provider]  linaclotide (LINZESS) 290 MCG CAPS capsule Take by mouth. 01/04/21  Yes [provider]  LORazepam (ATIVAN) 1 MG tablet Take by mouth. 01/13/21  Yes [provider]  methocarbamol (ROBAXIN) 500 MG tablet Take 1 tablet (500 mg total) by mouth 2 (two) times daily. 02/21/21  Yes Maxwell Caul, PA-C  nabumetone (RELAFEN) 750 MG tablet Take by mouth. 12/06/20  Yes [provider]  albuterol (VENTOLIN HFA) 108 (90 Base) MCG/ACT inhaler Inhale into the lungs. 06/09/16   [provider]  Ascorbic Acid (VITAMIN C) 1000 MG tablet Take 1,000 mg by mouth daily.    [provider]  Azelastine-Fluticasone 137-50 MCG/ACT SUSP Frequency:   Dosage:0   MCG/ACT  Instructions:  Note:Use one spray into each nostril twice a day for allergy. 10/26/12   [provider]  B Complex Vitamins (VITAMIN B-COMPLEX) TABS Take by mouth.    [provider]  b complex vitamins capsule Take 2 capsules by mouth daily.    [provider]  BIOTIN PO Take 1 tablet by mouth daily.    [provider]  Calcium Carbonate-Vitamin D (CALCIUM-VITAMIN D) 600-200 MG-UNIT CAPS Take 4 capsules by mouth daily.    [provider]  Calcium Carbonate-Vitamin D 600-200 MG-UNIT CAPS Take by mouth.    [provider]  CINNAMON PO Take 2 tablets by mouth daily.    [provider]  conjugated estrogens (PREMARIN) vaginal cream  01/12/17   [provider]  cyclobenzaprine (FLEXERIL) 5 MG tablet Take 1 tablet (5 mg total) by mouth 3 (three) times daily as needed for muscle spasms. 02/23/18   Beaulah Dinning, MD  estradiol (ESTRACE) 0.1 MG/GM vaginal cream Place vaginally. 04/08/18   [provider]  fexofenadine (ALLEGRA) 180 MG tablet Take 180 mg by mouth daily.    [provider]  fluocinonide (LIDEX) 0.05 % external solution APPLY SOLUTION TOPICALLY TO AFFECTED AREA TWICE DAILY  05/23/18   [provider]  fluticasone (FLONASE) 50 MCG/ACT nasal spray Place 2 sprays into the nose daily as needed. For allergies 05/10/13   Emilia Beck, PA-C  gabapentin (NEURONTIN) 100 MG capsule TAKE 1 TO 3 CAPSULES BY MOUTH ONCE DAILY IN THE MORNING AS TOLERATED AND 3 ONCE DAILY AT BEDTIME 08/11/15   [provider]  gabapentin (NEURONTIN) 400 MG capsule Take 400 mg by mouth 3 (three) times daily.    [provider]  guaiFENesin (MUCINEX) 600 MG 12 hr tablet Take 1 tablet (600 mg total) by  mouth 2 (two) times daily. 11/03/15   [name], [name], PA-C  hydrochlorothiazide (MICROZIDE) 12.5 MG capsule Take 12.5 mg by mouth daily.    [provider]  HYDROcodone-acetaminophen (NORCO) 5-325 MG tablet Take 1-2 tablets by mouth every 6 (six) hours as needed. 06/14/19   Geoffery Lyons, MD  ibuprofen (ADVIL) 800 MG tablet  12/21/16   [provider]  ibuprofen (ADVIL,MOTRIN) 800 MG tablet Take 1 tablet (800 mg total) by mouth every 8 (eight) hours as needed for moderate pain. 09/07/17   Fayrene Helper, PA-C  ketoconazole (NIZORAL) 2 % cream APPLY CREAM TOPICALLY TO AFFECTED AREA TWICE DAILY 05/23/18   [provider]  L-LYSINE PO Take 1 tablet by mouth daily.    [provider]  linaclotide Karlene Einstein) 145 MCG CAPS capsule Take by mouth. 10/04/18   [provider]  loratadine (CLARITIN) 10 MG tablet Take by mouth.    [provider]  Multiple Vitamins-Minerals (MULTIVITAMIN WITH MINERALS) tablet Take 2 tablets by mouth daily.    [provider]  Multiple Vitamins-Minerals (MULTIVITAMIN WITH MINERALS) tablet Take by mouth.    [provider]  nabumetone (RELAFEN) 500 MG tablet Take 500 mg by mouth daily as needed.    [provider]  nabumetone (RELAFEN) 500 MG tablet Take 1 tablet by mouth twice daily as needed for pain 11/16/14   [provider]  NON FORMULARY Take 2-4 capsules by mouth daily.  Rosemary capsule    [provider]  NON FORMULARY Take 2-4 capsules by mouth daily. Horsetail capsule    [provider]  NON FORMULARY     [provider]  triamcinolone (NASACORT AQ) 55 MCG/ACT AERO nasal inhaler Place 2 sprays into the nose daily. 11/03/15   [name], [name], PA-C    Allergies    Aspirin, Codeine, Doxycycline, and Other  Review of Systems   Review of Systems  Cardiovascular: Negative for chest pain.  Gastrointestinal: Negative for abdominal pain, nausea and vomiting.  Genitourinary: Negative for dysuria and hematuria.  Neurological: Negative for headaches.  All other systems reviewed and are negative.   Physical Exam Updated Vital Signs BP 136/88 (BP Location: Right Arm)   Pulse 68   Temp 98.4 F (36.9 C) (Oral)   Resp 16   Ht 5\' 5"  (1.651 m)   Wt 66.7 kg   SpO2 98%   BMI 24.46 kg/m   Physical Exam Vitals and nursing note reviewed.  Constitutional:      Appearance: She is well-developed.  HENT:     Head: Normocephalic and atraumatic.     Comments: No tenderness to palpation of skull. No deformities or crepitus noted. No open wounds, abrasions or lacerations.  Eyes:     General: No scleral icterus.       Right eye: No discharge.        Left eye: No discharge.     Conjunctiva/sclera: Conjunctivae normal.     Comments: PERRL. EOMs intact. No nystagmus. No neglect.   Neck:     Comments: Full flexion/extension and lateral movement of neck fully intact. No bony midline tenderness. No deformities or crepitus.  Cardiovascular:     Pulses:          Radial pulses are 2+ on the right side and 2+ on the left side.       Dorsalis pedis pulses are 2+ on the right side.  Pulmonary:     Effort: Pulmonary effort is normal.  Musculoskeletal:  Comments: Tenderness palpation the left shoulder.  No deformity crepitus noted.  Linderman range of motion secondary to pain.  No bony tenderness of the left elbow, left forearm, left wrist.   Tenderness palpation of the right shoulder.  No deformity or crepitus noted.  Limited range of motion secondary to pain.  No bony tenderness noted to right elbow, right wrist, right forearm.  Tenderness palpation in anterior aspect right knee.  There is some mild overlying soft tissue feeling.  No deformity or crepitus noted.  Flexion/tension intact.  Skin:    General: Skin is warm and dry.  Neurological:     Mental Status: She is alert.     Comments: Cranial nerves III-XII intact Follows commands, Moves all extremities  5/5 strength to BUE and BLE  Sensation intact throughout all major nerve distributions No slurred speech. No facial droop.   Psychiatric:        Speech: Speech normal.        Behavior: Behavior normal.     ED Results / Procedures / Treatments   Labs (all labs ordered are listed, but only abnormal results are displayed) Labs Reviewed - No data to display  EKG None  Radiology No results found.  Procedures Procedures   Medications Ordered in ED Medications - No data to display  ED Course  I have reviewed the triage vital signs and the nursing notes.  Pertinent labs & imaging results that were available during my care of the patient were reviewed by me and considered in my medical decision making (see chart for details).    MDM Rules/Calculators/A&P                          70 year old female who presents for evaluation of bilateral shoulder pain, right knee pain after mechanical fall that occurred yesterday.  No head injury, LOC.  No numbness but does occasionally report that she get some tingling sensation noted to her left arm.  On initial arrival, she is afebrile, nontoxic-appearing.  Vital signs are stable.  On exam, she is neurovascularly intact.  No neurodeficits noted.  We will plan for x-ray imaging of shoulders and knee.  Patient signed out to Parke Poisson, PA-C pending XRs.   Portions of this note were generated with [employer]. Dictation errors may occur despite best attempts at proofreading.  Final Clinical Impression(s) / ED Diagnoses Final diagnoses:  Acute pain of both shoulders  Acute pain of right knee    Rx / DC Orders ED Discharge Orders         Ordered    methocarbamol (ROBAXIN) 500 MG tablet  2 times daily        02/21/21 2031           Rosana Hoes 02/21/21 2033    Tilden Fossa, MD 02/24/21 1450

## 2021-02-21 NOTE — ED Provider Notes (Signed)
  Care of patient assumed from PA Layden at the end of her shift.  Agree with history, physical exam and plan.  See their note for further details. Briefly, patient presents emergency department with chief complaint of bilateral shoulder pain and right knee pain after mechanical fall in her bathroom yesterday evening.  Physical Exam  BP 136/88 (BP Location: Right Arm)   Pulse 68   Temp 98.4 F (36.9 C) (Oral)   Resp 16   Ht 5\' 5"  (1.651 m)   Wt 66.7 kg   SpO2 98%   BMI 24.46 kg/m   Physical Exam Vitals and nursing note reviewed.  Constitutional:      General: She is not in acute distress.    Appearance: She is not ill-appearing, toxic-appearing or diaphoretic.  HENT:     Head: Normocephalic.  Eyes:     General: No scleral icterus.       Right eye: No discharge.        Left eye: No discharge.  Cardiovascular:     Rate and Rhythm: Normal rate.  Pulmonary:     Effort: Pulmonary effort is normal. No respiratory distress.     Breath sounds: No stridor.  Skin:    General: Skin is warm and dry.  Neurological:     General: No focal deficit present.     Mental Status: She is alert.     GCS: GCS eye subscore is 4. GCS verbal subscore is 5. GCS motor subscore is 6.  Psychiatric:        Behavior: Behavior is cooperative.     ED Course/Procedures     Procedures  MDM    At time of handoff patient has right knee and bilateral shoulder x-rays pending.  Plan to discharge patient with prescription for Robaxin if x-ray imaging shows no acute osseous findings.  X-ray right knee shows intact right knee replacement, no evidence of fracture, dislocation, joint effusion, soft tissues are unremarkable. X-ray left shoulder so no evidence of fracture, dislocation, arthropathy, soft tissues are unremarkable. X-ray of right shoulder shows no evidence of acute fracture, dislocation, soft tissues are unremarkable.  Stable benign-appearing lucencies are noted within the right humeral head, mild  degenerative changes are seen involving the right glenohumeral articulation.  Discussed findings with patient.  Patient given strict return precautions.  Patient expressed understanding of all instructions and is agreeable with this plan.     , PA-C 02/22/21 0359    04/24/21, MD 02/24/21 1450

## 2021-02-21 NOTE — Discharge Instructions (Addendum)
You can take Tylenol or Ibuprofen as directed for pain. You can alternate Tylenol and Ibuprofen every 4 hours. If you take Tylenol at 1pm, then you can take Ibuprofen at 5pm. Then you can take Tylenol again at 9pm.   Take Robaxin as prescribed. This medication will make you drowsy so do not drive or drink alcohol when taking it.  Return to emergency department for any worsening pain, numbness/weakness, fevers or any other worsening concerning symptoms.

## 2021-02-21 NOTE — ED Triage Notes (Signed)
Pt states she had a fall in her bathroom last night between the tub and toilet states that she is now having pain in both shoulders and right knee, pt ambulatory to triage. Pt denies hitting head, denies blood thinners.

## 2021-05-23 ENCOUNTER — Emergency Department (HOSPITAL_BASED_OUTPATIENT_CLINIC_OR_DEPARTMENT_OTHER)
Admission: EM | Admit: 2021-05-23 | Discharge: 2021-05-23 | Disposition: A | Discharge status: Home / Self Care | Payer: Medicare Other | Attending: Emergency Medicine | Admitting: Emergency Medicine

## 2021-05-23 ENCOUNTER — Other Ambulatory Visit: Payer: Self-pay

## 2021-05-23 ENCOUNTER — Emergency Department (HOSPITAL_BASED_OUTPATIENT_CLINIC_OR_DEPARTMENT_OTHER): Payer: Medicare Other

## 2021-05-23 ENCOUNTER — Encounter (HOSPITAL_BASED_OUTPATIENT_CLINIC_OR_DEPARTMENT_OTHER): Payer: Self-pay | Admitting: *Deleted

## 2021-05-23 DIAGNOSIS — W06XXXA Fall from bed, initial encounter: Secondary | ICD-10-CM | POA: Insufficient documentation

## 2021-05-23 DIAGNOSIS — M25522 Pain in left elbow: Secondary | ICD-10-CM | POA: Insufficient documentation

## 2021-05-23 DIAGNOSIS — S0990XA Unspecified injury of head, initial encounter: Secondary | ICD-10-CM | POA: Diagnosis present

## 2021-05-23 DIAGNOSIS — I10 Essential (primary) hypertension: Secondary | ICD-10-CM | POA: Diagnosis not present

## 2021-05-23 DIAGNOSIS — M25552 Pain in left hip: Secondary | ICD-10-CM | POA: Diagnosis not present

## 2021-05-23 DIAGNOSIS — Z7952 Long term (current) use of systemic steroids: Secondary | ICD-10-CM | POA: Insufficient documentation

## 2021-05-23 DIAGNOSIS — Z96653 Presence of artificial knee joint, bilateral: Secondary | ICD-10-CM | POA: Diagnosis not present

## 2021-05-23 DIAGNOSIS — W19XXXA Unspecified fall, initial encounter: Secondary | ICD-10-CM

## 2021-05-23 DIAGNOSIS — J45909 Unspecified asthma, uncomplicated: Secondary | ICD-10-CM | POA: Insufficient documentation

## 2021-05-23 DIAGNOSIS — R410 Disorientation, unspecified: Secondary | ICD-10-CM | POA: Diagnosis not present

## 2021-05-23 DIAGNOSIS — Z79899 Other long term (current) drug therapy: Secondary | ICD-10-CM | POA: Insufficient documentation

## 2021-05-23 MED ORDER — ACETAMINOPHEN 325 MG PO TABS
650.0000 mg | ORAL_TABLET | Freq: Once | ORAL | Status: AC
  Administered 2021-05-23: 650 mg via ORAL
  Filled 2021-05-23: qty 2

## 2021-05-23 MED ORDER — IBUPROFEN 800 MG PO TABS: 800.0000 mg | ORAL_TABLET | Freq: Three times a day (TID) | ORAL | 0 refills | Status: DC

## 2021-05-23 MED ORDER — METHOCARBAMOL 500 MG PO TABS: 500.0000 mg | ORAL_TABLET | Freq: Two times a day (BID) | ORAL | 0 refills | Status: DC

## 2021-05-23 NOTE — Discharge Instructions (Signed)
Please use Tylenol or ibuprofen for pain.  You may use 800 mg ibuprofen every 6 hours or 1000 mg of Tylenol every 6 hours.  You may choose to alternate between the 2.  This would be most effective.  Not to exceed 4 g of Tylenol within 24 hours.  Not to exceed 3200 mg ibuprofen 24 hours.  You may also take the muscle relaxant up to twice a day.  There is no evidence of any fracture, no evidence of damage to your spine, no evidence of an intracranial head bleed.  Please follow-up with your primary care for further evaluation, please return if you begin to have numbness, confusion, vision changes, headache does not improve despite treatment.  I expect that you may have more soreness tomorrow than today.

## 2021-05-23 NOTE — ED Provider Notes (Signed)
[facility] EMERGENCY DEPARTMENT Provider Note   CSN: [id number] Arrival date & time: 05/23/21  1110     History Chief Complaint  Patient presents with   Fall    April Hunt is a 70 y.o. female no significant past medical history who presents after falling out of her bed this morning, patient reports that she was getting into bed in the dark, and did not fully position her body in the bed, before releasing her weight.  Patient sustained a fall out of her bed, she hit the back of her head on the corner of her dresser, as well as her left elbow, and left hip.  Patient endorses she is in a lot of pain, was initially somewhat confused after the injury.  Patient is not taking any blood thinners, denies loss of consciousness, denies dizziness, or lightheadedness prior to fall.  No difficulty with urination, defecation, no saddle anesthesia.  Patient denies chest pain, shortness of breath, palpitations, dysuria, abdominal pain.  Ambulatory at time of arrival.   Fall Associated symptoms include headaches.      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   History of shingles    Hypertension    Joint pain 03/19/2012   Neck pain 12/25/2011   Osteoarthritis of right knee 12/26/2013   Otitis media 03/19/2012   Palpitations    PONV (postoperative nausea and vomiting)    Positive PPD, treated    Pure hypercholesterolemia    Refusal of blood transfusions as patient is Jehovah's Witness    Sinusitis 03/04/2013   Tinea pedis 03/19/2012   Tinea versicolor  03/19/2012    Patient Active Problem List   Diagnosis Date Noted   Osteoarthritis of right knee 12/26/2013   Headache 05/13/2013   Sinusitis 03/04/2013   Back pain 07/21/2012   Routine general medical examination at a health care facility 03/19/2012   Joint pain 03/19/2012   Tinea versicolor 03/19/2012   Tinea pedis 03/19/2012   Otitis media 03/19/2012   Neck pain 12/25/2011   Asthma 12/25/2011   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   Elevated blood pressure reading without diagnosis of hypertension 12/25/2011    Past Surgical History:  Procedure Laterality Date   ABDOMINAL HYSTERECTOMY  1998   Still has cervix and both ovaries   APPENDECTOMY     BACK SURGERY     JOINT REPLACEMENT     scope bil knees   KNEE ARTHROSCOPY  1997   right knee   Lymph node resection  1999   benign   MENISCUS REPAIR  1995   left knee   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     Family History  Problem Relation  Age of Onset   Hyperlipidemia Mother    Hypertension Mother    Diabetes Father    Tuberculosis Father    Heart disease Paternal Aunt    Cancer Paternal Uncle        colon cancer   Cancer Cousin        breast    Social History   Tobacco Use   Smoking status: Never   Smokeless tobacco: Never  Vaping Use   Vaping Use: Never used  Substance Use Topics   Alcohol use: Not Currently    Comment: rarely   Drug use: No    Home Medications Prior to Admission medications   Medication Sig Start Date End Date Taking? Authorizing Provider  ibuprofen (ADVIL) 800 MG tablet Take 1 tablet (800 mg total) by mouth 3 (three) times daily. 05/23/21  Yes Timira Bieda H, PA-C  methocarbamol (ROBAXIN) 500 MG tablet Take 1 tablet (500 mg total) by  mouth 2 (two) times daily. 05/23/21  Yes Alexa Golebiewski H, PA-C  albuterol (VENTOLIN HFA) 108 (90 Base) MCG/ACT inhaler Inhale into the lungs. 06/09/16   [provider]  Ascorbic Acid (VITAMIN C) 1000 MG tablet Take 1,000 mg by mouth daily.    [provider]  Azelastine-Fluticasone 137-50 MCG/ACT SUSP Frequency:   Dosage:0   MCG/ACT  Instructions:  Note:Use one spray into each nostril twice a day for allergy. 10/26/12   [provider]  B Complex Vitamins (VITAMIN B-COMPLEX) TABS Take by mouth.    [provider]  b complex vitamins capsule Take 2 capsules by mouth daily.    [provider]  BIOTIN PO Take 1 tablet by mouth daily.    [provider]  Calcium Carbonate-Vitamin D (CALCIUM-VITAMIN D) 600-200 MG-UNIT CAPS Take 4 capsules by mouth daily.    [provider]  Calcium Carbonate-Vitamin D 600-200 MG-UNIT CAPS Take by mouth.    [provider]  CINNAMON PO Take 2 tablets by mouth daily.    [provider]  conjugated estrogens (PREMARIN) vaginal cream  01/12/17   [provider]  cyclobenzaprine (FLEXERIL) 5 MG tablet Take 1 tablet (5 mg total) by mouth 3 (three) times daily as needed for muscle spasms. 02/23/18   Beaulah DinningGambino, Christina M, MD  cycloSPORINE (RESTASIS) 0.05 % ophthalmic emulsion Place 1 drop into both eyes every 12 hours. 02/02/21 02/02/22  [provider]  erythromycin ophthalmic ointment Place into both eyes nightly. 02/02/21   [provider]  estradiol (ESTRACE) 0.1 MG/GM vaginal cream Place vaginally. 04/08/18   [provider]  fexofenadine (ALLEGRA) 180 MG tablet Take 180 mg by mouth daily.    [provider]  fluocinonide (LIDEX) 0.05 % external solution APPLY SOLUTION TOPICALLY TO AFFECTED AREA TWICE DAILY 05/23/18   [provider]  fluticasone (FLONASE) 50 MCG/ACT nasal spray Place 2 sprays into the nose daily as needed. For allergies 05/10/13    Emilia BeckSzekalski, Kaitlyn, PA-C  gabapentin (NEURONTIN) 100 MG capsule TAKE 1 TO 3 CAPSULES BY MOUTH ONCE DAILY IN THE MORNING AS TOLERATED AND 3 ONCE DAILY AT BEDTIME 08/11/15   [provider]  gabapentin (NEURONTIN) 400 MG capsule Take 400 mg by mouth 3 (three) times daily.    [provider]  guaiFENesin (MUCINEX) 600 MG 12 hr tablet Take 1 tablet (600 mg total) by mouth 2 (two) times daily. 11/03/15   [name], [name], PA-C  hydrochlorothiazide (MICROZIDE) 12.5 MG capsule Take 12.5 mg by mouth daily.  [provider]  HYDROcodone-acetaminophen (NORCO) 5-325 MG tablet Take 1-2 tablets by mouth every 6 (six) hours as needed. 06/14/19   Geoffery Lyons, MD  ketoconazole (NIZORAL) 2 % cream APPLY CREAM TOPICALLY TO AFFECTED AREA TWICE DAILY 05/23/18   [provider]  L-LYSINE PO Take 1 tablet by mouth daily.    [provider]  linaclotide Karlene Einstein) 145 MCG CAPS capsule Take by mouth. 10/04/18   [provider]  linaclotide Karlene Einstein) 290 MCG CAPS capsule Take by mouth. 01/04/21   [provider]  loratadine (CLARITIN) 10 MG tablet Take by mouth.    [provider]  LORazepam (ATIVAN) 1 MG tablet Take by mouth. 01/13/21   [provider]  Multiple Vitamins-Minerals (MULTIVITAMIN WITH MINERALS) tablet Take 2 tablets by mouth daily.    [provider]  Multiple Vitamins-Minerals (MULTIVITAMIN WITH MINERALS) tablet Take by mouth.    [provider]  nabumetone (RELAFEN) 500 MG tablet Take 500 mg by mouth daily as needed.    [provider]  nabumetone (RELAFEN) 500 MG tablet Take 1 tablet by mouth twice daily as needed for pain 11/16/14   [provider]  nabumetone (RELAFEN) 750 MG tablet Take by mouth. 12/06/20   [provider]  NON FORMULARY Take 2-4 capsules by mouth daily. Rosemary capsule    [provider]  NON FORMULARY Take 2-4 capsules by mouth daily. Horsetail capsule     [provider]  NON FORMULARY     [provider]  triamcinolone (NASACORT AQ) 55 MCG/ACT AERO nasal inhaler Place 2 sprays into the nose daily. 11/03/15   [name], [name], PA-C    Allergies    Aspirin, Codeine, Doxycycline, and Other  Review of Systems   Review of Systems  Musculoskeletal:        Elbow pain, left hip pain.  Neurological:  Positive for headaches.  All other systems reviewed and are negative.  Physical Exam Updated Vital Signs BP (!) 150/85 (BP Location: Right Arm)   Pulse 85   Temp 98.7 F (37.1 C) (Oral)   Resp 18   Ht  (1.651 m)   Wt 66 kg   SpO2 99%   BMI 24.23 kg/m   Physical Exam Vitals and nursing note reviewed.  Constitutional:      General: She is not in acute distress.    Appearance: Normal appearance.  HENT:     Head: Normocephalic and atraumatic.  Eyes:     General:        Right eye: No discharge.        Left eye: No discharge.     Extraocular Movements: Extraocular movements intact.     Pupils: Pupils are equal, round, and reactive to light.  Cardiovascular:     Rate and Rhythm: Normal rate and regular rhythm.     Heart sounds: No murmur heard.   No friction rub. No gallop.     Comments: DP, PT pulses intact and 2+ in bilateral lower extremities.  Radial and ulnar pulses intact and 2+ bilateral upper extremities Pulmonary:     Effort: Pulmonary effort is normal.     Breath sounds: Normal breath sounds.  Abdominal:     General: Bowel sounds are normal.     Palpations: Abdomen is soft.  Musculoskeletal:     Comments: Tenderness palpation posterior occipital skull on the right side, without active bleeding or laceration.  Some paraspinous muscle tenderness in the neck, without midline spinal tenderness.  Tenderness to palpation of the left deformity, tenderness to palpation of the left hip without obvious deformity.  5 out of 5 strength in bilateral upper and lower extremities.  Skin:    General: Skin is warm and  dry.     Capillary Refill: Capillary refill takes less than 2 seconds.  Neurological:     Mental Status: She is alert and oriented to person, place, and time.     Comments: CN III through XII grossly intact other than some subjective numbness or difference in sensation over right cheek.  She has no facial muscle asymmetry.  Strength 5 out of 5 in upper and lower extremities.  Normal 2+ reflexes in upper and lower extremities.  No visual disturbance.  No sensory deficits of upper and lower extremities.  Psychiatric:        Mood and Affect: Mood normal.        Behavior: Behavior normal.    ED Results / Procedures / Treatments   Labs (all labs ordered are listed, but only abnormal results are displayed) Labs Reviewed - No data to display  EKG None  Radiology DG Elbow Complete Left  Result Date: 05/23/2021 CLINICAL DATA:  Rolled off the bed at 2 a.m. striking LEFT upper arm and LEFT hip EXAM: LEFT ELBOW - COMPLETE 3+ VIEW COMPARISON:  None FINDINGS: Osseous mineralization normal. Joint spaces preserved. No acute fracture, dislocation, or bone destruction. Minimal dorsal soft tissue swelling overlying the olecranon. No joint effusion. IMPRESSION: No acute osseous abnormalities. Electronically Signed   By: Ulyses Southward M.D.   On: 05/23/2021 13:35   CT HEAD WO CONTRAST ( )  Result Date: 05/23/2021 CLINICAL DATA:  Fall, posterior head injury, confusion, neck pain EXAM: CT HEAD WITHOUT CONTRAST CT CERVICAL SPINE WITHOUT CONTRAST TECHNIQUE: Multidetector CT imaging of the head and cervical spine was performed following the standard protocol without intravenous contrast. Multiplanar CT image reconstructions of the cervical spine were also generated. COMPARISON:  None. FINDINGS: CT HEAD FINDINGS Brain: No evidence of acute infarction, hemorrhage, hydrocephalus, extra-axial collection or mass lesion/mass effect. Vascular: No hyperdense vessel or unexpected calcification. Skull: Normal. Negative for  fracture or focal lesion. Sinuses/Orbits: No acute finding. Other: None. CT CERVICAL SPINE FINDINGS Alignment: Straightened alignment may be positional. No subluxation or dislocation. Facets are aligned. Multilevel facet arthropathy posteriorly. Skull base and vertebrae: No acute fracture. No primary bone lesion or focal pathologic process. Soft tissues and spinal canal: No prevertebral fluid or swelling. No visible canal hematoma. Disc levels: Severe multilevel cervical degenerative change spanning C2 through T1. All levels demonstrate disc space narrowing, sclerosis and endplate osteophytes. Degenerative changes C1-2 articulation as well. Preserved vertebral body heights. No acute osseous finding. Upper chest: Negative. Other: None. IMPRESSION: Normal head CT without contrast for age. Cervical degenerative changes as above without acute osseous finding, fracture or malalignment by CT. Electronically Signed   By: Judie Petit.  Shick M.D.   On: 05/23/2021 14:01   CT Cervical Spine Wo Contrast  Result Date: 05/23/2021 CLINICAL DATA:  Fall, posterior head injury, confusion, neck pain EXAM: CT HEAD WITHOUT CONTRAST CT CERVICAL SPINE WITHOUT CONTRAST TECHNIQUE: Multidetector CT imaging of the head and cervical spine was performed following the standard protocol without intravenous contrast. Multiplanar CT image reconstructions of the cervical spine were also generated. COMPARISON:  None. FINDINGS: CT HEAD FINDINGS Brain: No evidence of acute infarction, hemorrhage, hydrocephalus, extra-axial collection or mass lesion/mass effect. Vascular: No hyperdense vessel or unexpected calcification. Skull: Normal. Negative for fracture or focal lesion.  Sinuses/Orbits: No acute finding. Other: None. CT CERVICAL SPINE FINDINGS Alignment: Straightened alignment may be positional. No subluxation or dislocation. Facets are aligned. Multilevel facet arthropathy posteriorly. Skull base and vertebrae: No acute fracture. No primary bone lesion or  focal pathologic process. Soft tissues and spinal canal: No prevertebral fluid or swelling. No visible canal hematoma. Disc levels: Severe multilevel cervical degenerative change spanning C2 through T1. All levels demonstrate disc space narrowing, sclerosis and endplate osteophytes. Degenerative changes C1-2 articulation as well. Preserved vertebral body heights. No acute osseous finding. Upper chest: Negative. Other: None. IMPRESSION: Normal head CT without contrast for age. Cervical degenerative changes as above without acute osseous finding, fracture or malalignment by CT. Electronically Signed   By: Judie Petit.  Shick M.D.   On: 05/23/2021 14:01   DG Hip Unilat W or Wo Pelvis 2-3 Views Left  Result Date: 05/23/2021 CLINICAL DATA:  Fall from bed.  Left hip pain. EXAM: DG HIP (WITH OR WITHOUT PELVIS) 2-3V LEFT COMPARISON:  Left hip radiographs 02/23/2018 FINDINGS: Asymmetric degenerative changes are again noted in the left hip. The hip is located. No underlying fracture is present. Bony pelvis is intact. Lumbar spine surgery noted. IMPRESSION: 1. No acute abnormality. 2. Asymmetric degenerative changes of the left hip. Electronically Signed   By: Marin Roberts M.D.   On: 05/23/2021 13:34    Procedures Procedures   Medications Ordered in ED Medications  acetaminophen (TYLENOL) tablet 650 mg (650 mg Oral Given 05/23/21 1237)    ED Course  I have reviewed the triage vital signs and the nursing notes.  Pertinent labs & imaging results that were available during my care of the patient were reviewed by me and considered in my medical decision making (see chart for details).    MDM Rules/Calculators/A&P                         I discussed this case with my attending physician who cosigned this note including patient's presenting symptoms, physical exam, and planned diagnostics and interventions. Attending physician stated agreement with plan or made changes to plan which were implemented.   Attending  physician assessed patient at bedside.  Patient had a mechanical, nonsyncopal fall.  Patient did sustain some head trauma, as well as injuries to her left elbow, and left hip.  CT of head and neck without abnormality.  No evidence of intracranial bleed, no fracture of skull, or displacement of any of the cervical spine.  Radiographic imaging of left elbow, and left hip do not reveal any acute fracture.  Discussed patient's pain is secondary to a traumatic fall, with understandable contusions, bruising, and other sequelae of any traumatic injury.  Recommend patient use ibuprofen and Tylenol for pain control, prescribed muscle relaxant for neck pain.  Discussed patient likely to feel more sore tomorrow.  Do not favor any neurologic abnormality, patient has no postconcussive symptoms.  Only focal finding is an isolated subjective numbness of the right cheek, discussed it is possible that she may have fallen partially on her cheek that I have the pain and numbness will improve.  Extensive return precautions given. Final Clinical Impression(s) / ED Diagnoses Final diagnoses:  Fall, initial encounter    Rx / DC Orders ED Discharge Orders          Ordered    ibuprofen (ADVIL) 800 MG tablet  3 times daily        05/23/21 1424    methocarbamol (ROBAXIN) 500 MG tablet  2 times daily        05/23/21 1424             Aryanne Gilleland, Harrel Carina, PA-C 05/23/21 1432    Virgina Norfolk, DO 05/23/21 1542

## 2021-05-23 NOTE — ED Triage Notes (Signed)
2am she rolled off the bed. She hit the right side of her head, left upper arm and left hip. She is ambulatory. Alert oriented.

## 2021-08-05 DIAGNOSIS — H40051 Ocular hypertension, right eye: Secondary | ICD-10-CM

## 2021-08-05 HISTORY — DX: Ocular hypertension, right eye: H40.051

## 2022-03-07 DIAGNOSIS — M5136 Other intervertebral disc degeneration, lumbar region: Secondary | ICD-10-CM

## 2022-03-07 DIAGNOSIS — M51369 Other intervertebral disc degeneration, lumbar region without mention of lumbar back pain or lower extremity pain: Secondary | ICD-10-CM

## 2022-03-07 HISTORY — DX: Other intervertebral disc degeneration, lumbar region without mention of lumbar back pain or lower extremity pain: M51.369

## 2022-03-24 ENCOUNTER — Encounter (HOSPITAL_BASED_OUTPATIENT_CLINIC_OR_DEPARTMENT_OTHER): Payer: Self-pay | Admitting: Emergency Medicine

## 2022-03-24 ENCOUNTER — Emergency Department (HOSPITAL_BASED_OUTPATIENT_CLINIC_OR_DEPARTMENT_OTHER)
Admission: EM | Admit: 2022-03-24 | Discharge: 2022-03-24 | Disposition: A | Discharge status: Home / Self Care | Payer: Medicare Other | Attending: Emergency Medicine | Admitting: Emergency Medicine

## 2022-03-24 DIAGNOSIS — T63451A Toxic effect of venom of hornets, accidental (unintentional), initial encounter: Secondary | ICD-10-CM | POA: Insufficient documentation

## 2022-03-24 DIAGNOSIS — Z23 Encounter for immunization: Secondary | ICD-10-CM | POA: Diagnosis not present

## 2022-03-24 MED ORDER — CEPHALEXIN 500 MG PO CAPS: 500.0000 mg | ORAL_CAPSULE | Freq: Four times a day (QID) | ORAL | 0 refills | Status: DC

## 2022-03-24 MED ORDER — PREDNISONE 50 MG PO TABS
60.0000 mg | ORAL_TABLET | Freq: Once | ORAL | Status: AC
  Administered 2022-03-24: 60 mg via ORAL
  Filled 2022-03-24: qty 1

## 2022-03-24 MED ORDER — TETANUS-DIPHTH-ACELL PERTUSSIS 5-2.5-18.5 LF-MCG/0.5 IM SUSY
0.5000 mL | PREFILLED_SYRINGE | Freq: Once | INTRAMUSCULAR | Status: AC
  Administered 2022-03-24: 0.5 mL via INTRAMUSCULAR
  Filled 2022-03-24: qty 0.5

## 2022-03-24 MED ORDER — IBUPROFEN 800 MG PO TABS: 800.0000 mg | ORAL_TABLET | Freq: Three times a day (TID) | ORAL | 0 refills | Status: DC

## 2022-03-24 NOTE — Discharge Instructions (Signed)
Continue taking Benadryl at home to help with the redness and swelling from your hornet sting.  We updated your tetanus and given you a single dose of steroids which should also help some of the redness.  Take ibuprofen 3 times daily and take Keflex for 5 days.  Return if you develop worsening symptoms after a couple of days of treatment.

## 2022-03-24 NOTE — ED Triage Notes (Signed)
Pt reports getting stung by a hornet this morning on right posterior wrist. Swelling and redness present. Took benadryl after incident occurred. Denies shob, difficulty breathing, cp, rash.

## 2022-03-24 NOTE — ED Notes (Signed)
Discharge instructions reviewed with pt. States understanding. Ambulatory and discharged home

## 2022-03-24 NOTE — ED Provider Notes (Signed)
[facility] EMERGENCY DEPARTMENT Provider Note   CSN: [id number] Arrival date & time: 03/24/22  1421     History  Chief Complaint  Patient presents with   Insect Bite    Hornet    April Hunt is a 71 y.o. female who presents to the ED for evaluation of a hornet sting that occurred a few days ago and has had worsening redness and swelling.  She sustained a sting on her right dorsal forearm.  Patient has been taking Benadryl without significant improvement.  She is concerned as the red area continues to spread and she also has some swelling around the wrist.  She denies fever, chills, motor dysfunction, chest pain, shortness of breath, numbness and tingling.  HPI     Home Medications Prior to Admission medications   Medication Sig Start Date End Date Taking? Authorizing Provider  cephALEXin (KEFLEX) 500 MG capsule Take 1 capsule (500 mg total) by mouth 4 (four) times daily. 03/24/22  Yes Raynald Blend R, PA-C  ibuprofen (ADVIL) 800 MG tablet Take 1 tablet (800 mg total) by mouth 3 (three) times daily. 03/24/22  Yes Raynald Blend R, PA-C  albuterol (VENTOLIN HFA) 108 (90 Base) MCG/ACT inhaler Inhale into the lungs. 06/09/16   [provider]  Ascorbic Acid (VITAMIN C) 1000 MG tablet Take 1,000 mg by mouth daily.    [provider]  Azelastine-Fluticasone 137-50 MCG/ACT SUSP Frequency:   Dosage:0   MCG/ACT  Instructions:  Note:Use one spray into each nostril twice a day for allergy. 10/26/12   [provider]  B Complex Vitamins (VITAMIN B-COMPLEX) TABS Take by mouth.    [provider]  b complex vitamins capsule Take 2 capsules by mouth daily.    [provider]  BIOTIN PO Take 1 tablet by mouth daily.    [provider]  Calcium Carbonate-Vitamin D (CALCIUM-VITAMIN D) 600-200 MG-UNIT CAPS Take 4 capsules by mouth daily.    [provider]  Calcium Carbonate-Vitamin D 600-200 MG-UNIT CAPS Take by mouth.     [provider]  CINNAMON PO Take 2 tablets by mouth daily.    [provider]  conjugated estrogens (PREMARIN) vaginal cream  01/12/17   [provider]  cyclobenzaprine (FLEXERIL) 5 MG tablet Take 1 tablet (5 mg total) by mouth 3 (three) times daily as needed for muscle spasms. 02/23/18   Beaulah Dinning, MD  erythromycin ophthalmic ointment Place into both eyes nightly. 02/02/21   [provider]  estradiol (ESTRACE) 0.1 MG/GM vaginal cream Place vaginally. 04/08/18   [provider]  fexofenadine (ALLEGRA) 180 MG tablet Take 180 mg by mouth daily.    [provider]  fluocinonide (LIDEX) 0.05 % external solution APPLY SOLUTION TOPICALLY TO AFFECTED AREA TWICE DAILY 05/23/18   [provider]  fluticasone (FLONASE) 50 MCG/ACT nasal spray Place 2 sprays into the nose daily as needed. For allergies 05/10/13   Emilia Beck, PA-C  gabapentin (NEURONTIN) 100 MG capsule TAKE 1 TO 3 CAPSULES BY MOUTH ONCE DAILY IN THE MORNING AS TOLERATED AND 3 ONCE DAILY AT BEDTIME 08/11/15   [provider]  gabapentin (NEURONTIN) 400 MG capsule Take 400 mg by mouth 3 (three) times daily.    [provider]  guaiFENesin (MUCINEX) 600 MG 12 hr tablet Take 1 tablet (600 mg total) by mouth 2 (two) times daily. 11/03/15   [name], [name], PA-C  hydrochlorothiazide (MICROZIDE) 12.5 MG capsule Take 12.5 mg by mouth daily.  [provider]  HYDROcodone-acetaminophen (NORCO) 5-325 MG tablet Take 1-2 tablets by mouth every 6 (six) hours as needed. 06/14/19   Geoffery Lyons, MD  ibuprofen (ADVIL) 800 MG tablet Take 1 tablet (800 mg total) by mouth 3 (three) times daily. 05/23/21   Prosperi, Christian H, PA-C  ketoconazole (NIZORAL) 2 % cream APPLY CREAM TOPICALLY TO AFFECTED AREA TWICE DAILY 05/23/18   [provider]  L-LYSINE PO Take 1 tablet by mouth daily.    [provider]  linaclotide Karlene Einstein) 145 MCG CAPS capsule Take  by mouth. 10/04/18   [provider]  linaclotide Karlene Einstein) 290 MCG CAPS capsule Take by mouth. 01/04/21   [provider]  loratadine (CLARITIN) 10 MG tablet Take by mouth.    [provider]  LORazepam (ATIVAN) 1 MG tablet Take by mouth. 01/13/21   [provider]  methocarbamol (ROBAXIN) 500 MG tablet Take 1 tablet (500 mg total) by mouth 2 (two) times daily. 05/23/21   Prosperi, Christian H, PA-C  Multiple Vitamins-Minerals (MULTIVITAMIN WITH MINERALS) tablet Take 2 tablets by mouth daily.    [provider]  Multiple Vitamins-Minerals (MULTIVITAMIN WITH MINERALS) tablet Take by mouth.    [provider]  nabumetone (RELAFEN) 500 MG tablet Take 500 mg by mouth daily as needed.    [provider]  nabumetone (RELAFEN) 500 MG tablet Take 1 tablet by mouth twice daily as needed for pain 11/16/14   [provider]  nabumetone (RELAFEN) 750 MG tablet Take by mouth. 12/06/20   [provider]  NON FORMULARY Take 2-4 capsules by mouth daily. Rosemary capsule    [provider]  NON FORMULARY Take 2-4 capsules by mouth daily. Horsetail capsule    [provider]  NON FORMULARY     [provider]  triamcinolone (NASACORT AQ) 55 MCG/ACT AERO nasal inhaler Place 2 sprays into the nose daily. 11/03/15   [name], [name], PA-C      Allergies    Aspirin, Codeine, Doxycycline, and Other    Review of Systems   Review of Systems  Physical Exam Updated Vital Signs BP (!) 146/84   Pulse 80   Temp 98.6 F (37 C) (Oral)   Resp 18   SpO2 100%  Physical Exam Vitals and nursing note reviewed.  Constitutional:      General: She is not in acute distress.    Appearance: She is not ill-appearing.  HENT:     Head: Atraumatic.  Eyes:     Conjunctiva/sclera: Conjunctivae normal.  Cardiovascular:     Rate and Rhythm: Normal rate and regular rhythm.     Pulses: Normal pulses.     Heart sounds: No murmur  heard. Pulmonary:     Effort: Pulmonary effort is normal. No respiratory distress.     Breath sounds: Normal breath sounds.  Abdominal:     General: Abdomen is flat. There is no distension.     Palpations: Abdomen is soft.     Tenderness: There is no abdominal tenderness.  Musculoskeletal:        General: Normal range of motion.     Cervical back: Normal range of motion.  Skin:    General: Skin is warm and dry.     Capillary Refill: Capillary refill takes less than 2 seconds.     Comments: Large areaof erythematous changes with central puncture wound on the right dorsal forearm.  No retained stinger.  Neurological:     General: No focal deficit  present.     Mental Status: She is alert.  Psychiatric:        Mood and Affect: Mood normal.     ED Results / Procedures / Treatments   Labs (all labs ordered are listed, but only abnormal results are displayed) Labs Reviewed - No data to display  EKG None  Radiology No results found.  Procedures Procedures    Medications Ordered in ED Medications  Tdap (BOOSTRIX) injection 0.5 mL (has no administration in time range)  predniSONE (DELTASONE) tablet 60 mg (60 mg Oral Given 03/24/22 1539)    ED Course/ Medical Decision Making/ A&P                           Medical Decision Making Risk Prescription drug management.   71 year old female presents to the ED for evaluation of worsening redness and swelling after a hornet sting about 2 days ago.  Differentials include uncomplicated local reaction, large local reaction, anaphylaxis or cellulitis.  Vitals are without significant abnormality.  On exam, she has a large area of erythema and tenderness with a central puncture wound on the dorsum of her right forearm.  Possible early cellulitis versus large local reaction.  Will update tetanus and give single dose of 60 mg prednisone to help with redness and swelling.  Will discharge home with Keflex x5 days along with ibuprofen.  Advised to  continue Benadryl.  Return precautions discussed.  Patient expresses understanding is amenable to plan.  Discharged home in good condition.  Final Clinical Impression(s) / ED Diagnoses Final diagnoses:  Hornet sting, accidental or unintentional, initial encounter    Rx / DC Orders ED Discharge Orders          Ordered    cephALEXin (KEFLEX) 500 MG capsule  4 times daily        03/24/22 1533    ibuprofen (ADVIL) 800 MG tablet  3 times daily        03/24/22 1533              Janell Quiet, [place] 03/24/22 1542    Benjiman Core, MD 03/24/22 2353

## 2022-04-03 ENCOUNTER — Encounter (HOSPITAL_BASED_OUTPATIENT_CLINIC_OR_DEPARTMENT_OTHER): Payer: Self-pay

## 2022-04-03 ENCOUNTER — Emergency Department (HOSPITAL_BASED_OUTPATIENT_CLINIC_OR_DEPARTMENT_OTHER): Payer: Medicare Other

## 2022-04-03 ENCOUNTER — Other Ambulatory Visit: Payer: Self-pay

## 2022-04-03 ENCOUNTER — Observation Stay (HOSPITAL_BASED_OUTPATIENT_CLINIC_OR_DEPARTMENT_OTHER)
Admission: EM | Admit: 2022-04-03 | Discharge: 2022-04-05 | Disposition: A | Discharge status: Home / Self Care | Payer: Medicare Other | Attending: Internal Medicine | Admitting: Internal Medicine

## 2022-04-03 DIAGNOSIS — R202 Paresthesia of skin: Secondary | ICD-10-CM | POA: Diagnosis present

## 2022-04-03 DIAGNOSIS — I639 Cerebral infarction, unspecified: Principal | ICD-10-CM | POA: Diagnosis present

## 2022-04-03 DIAGNOSIS — R41841 Cognitive communication deficit: Secondary | ICD-10-CM | POA: Diagnosis not present

## 2022-04-03 DIAGNOSIS — J4520 Mild intermittent asthma, uncomplicated: Secondary | ICD-10-CM | POA: Diagnosis not present

## 2022-04-03 DIAGNOSIS — G459 Transient cerebral ischemic attack, unspecified: Secondary | ICD-10-CM

## 2022-04-03 DIAGNOSIS — Z9889 Other specified postprocedural states: Secondary | ICD-10-CM | POA: Diagnosis not present

## 2022-04-03 DIAGNOSIS — R29898 Other symptoms and signs involving the musculoskeletal system: Secondary | ICD-10-CM | POA: Diagnosis not present

## 2022-04-03 DIAGNOSIS — R0989 Other specified symptoms and signs involving the circulatory and respiratory systems: Secondary | ICD-10-CM | POA: Diagnosis present

## 2022-04-03 DIAGNOSIS — Z79899 Other long term (current) drug therapy: Secondary | ICD-10-CM | POA: Diagnosis not present

## 2022-04-03 DIAGNOSIS — R2990 Unspecified symptoms and signs involving the nervous system: Secondary | ICD-10-CM

## 2022-04-03 DIAGNOSIS — Z96651 Presence of right artificial knee joint: Secondary | ICD-10-CM | POA: Insufficient documentation

## 2022-04-03 DIAGNOSIS — I10 Essential (primary) hypertension: Secondary | ICD-10-CM | POA: Diagnosis not present

## 2022-04-03 DIAGNOSIS — R200 Anesthesia of skin: Secondary | ICD-10-CM

## 2022-04-03 DIAGNOSIS — K219 Gastro-esophageal reflux disease without esophagitis: Secondary | ICD-10-CM | POA: Diagnosis present

## 2022-04-03 LAB — COMPREHENSIVE METABOLIC PANEL
ALT: 23 U/L (ref 0–44)
AST: 30 U/L (ref 15–41)
Albumin: 3.9 g/dL (ref 3.5–5.0)
Alkaline Phosphatase: 52 U/L (ref 38–126)
Anion gap: 7 (ref 5–15)
BUN: 9 mg/dL (ref 8–23)
CO2: 27 mmol/L (ref 22–32)
Calcium: 9.2 mg/dL (ref 8.9–10.3)
Chloride: 107 mmol/L (ref 98–111)
Creatinine, Ser: 0.88 mg/dL (ref 0.44–1.00)
GFR, Estimated: >60 mL/min (ref >60)
Glucose, Bld: 126 mg/dL — ABNORMAL HIGH (ref 70–99)
Potassium: 3.5 mmol/L (ref 3.5–5.1)
Sodium: 141 mmol/L (ref 135–145)
Total Bilirubin: 0.3 mg/dL (ref 0.3–1.2)
Total Protein: 7.1 g/dL (ref 6.5–8.1)

## 2022-04-03 LAB — DIFFERENTIAL
Abs Immature Granulocytes: 0.03 10*3/uL (ref 0.00–0.07)
Basophils Absolute: 0.1 10*3/uL (ref 0.0–0.1)
Basophils Relative: 1 %
Eosinophils Absolute: 0.2 10*3/uL (ref 0.0–0.5)
Eosinophils Relative: 4 %
Immature Granulocytes: 1 %
Lymphocytes Relative: 33 %
Lymphs Abs: 1.7 10*3/uL (ref 0.7–4.0)
Monocytes Absolute: 0.4 10*3/uL (ref 0.1–1.0)
Monocytes Relative: 8 %
Neutro Abs: 2.8 10*3/uL (ref 1.7–7.7)
Neutrophils Relative %: 53 %

## 2022-04-03 LAB — CBC
HCT: 38.4 % (ref 36.0–46.0)
Hemoglobin: 12.6 g/dL (ref 12.0–15.0)
MCH: 31.5 pg (ref 26.0–34.0)
MCHC: 32.8 g/dL (ref 30.0–36.0)
MCV: 96 fL (ref 80.0–100.0)
Platelets: 262 10*3/uL (ref 150–400)
RBC: 4 MIL/uL (ref 3.87–5.11)
RDW: 13.1 % (ref 11.5–15.5)
WBC: 5.2 10*3/uL (ref 4.0–10.5)
nRBC: 0 % (ref 0.0–0.2)

## 2022-04-03 LAB — RAPID URINE DRUG SCREEN, HOSP PERFORMED
Amphetamines: NOT DETECTED
Barbiturates: NOT DETECTED
Benzodiazepines: NOT DETECTED
Cocaine: NOT DETECTED
Opiates: NOT DETECTED
Tetrahydrocannabinol: NOT DETECTED

## 2022-04-03 LAB — URINALYSIS, ROUTINE W REFLEX MICROSCOPIC
Bilirubin Urine: NEGATIVE
Glucose, UA: NEGATIVE mg/dL
Hgb urine dipstick: NEGATIVE
Ketones, ur: NEGATIVE mg/dL
Nitrite: NEGATIVE
Protein, ur: NEGATIVE mg/dL
Specific Gravity, Urine: 1.01 (ref 1.005–1.030)
pH: 6 (ref 5.0–8.0)

## 2022-04-03 LAB — ETHANOL: Alcohol, Ethyl (B): <10 mg/dL (ref <10)

## 2022-04-03 LAB — URINALYSIS, MICROSCOPIC (REFLEX): RBC / HPF: NONE SEEN RBC/hpf (ref 0–5)

## 2022-04-03 LAB — CBG MONITORING, ED: Glucose-Capillary: 117 mg/dL — ABNORMAL HIGH (ref 70–99)

## 2022-04-03 LAB — APTT: aPTT: 28 seconds (ref 24–36)

## 2022-04-03 LAB — PROTIME-INR
INR: 0.9 (ref 0.8–1.2)
Prothrombin Time: 12 seconds (ref 11.4–15.2)

## 2022-04-03 MED ORDER — IOHEXOL 350 MG/ML SOLN
75.0000 mL | Freq: Once | INTRAVENOUS | Status: AC | PRN
  Administered 2022-04-03: 75 mL via INTRAVENOUS

## 2022-04-03 NOTE — ED Triage Notes (Signed)
Was going to take a shower and felt numbness in her right leg and face around lips. Decreased sensation to right leg and face. Weakness to right leg, no facial droop, equal grip strengths in arms. LKW 1700.   Theron Arista, PA at bedside, code stroke called.

## 2022-04-03 NOTE — ED Notes (Signed)
Family at bedside. 

## 2022-04-03 NOTE — ED Provider Notes (Signed)
[facility] EMERGENCY DEPARTMENT Provider Note   CSN: [id number] Arrival date & time: 04/03/22  1821     History {Add pertinent medical, surgical, social history, OB history to HPI:1} Chief Complaint  Patient presents with   Numbness    April Hunt is a 71 y.o. female.  Patient is a 71 year old female with past medical history of hypertension and hyperlipidemia presenting for strokelike symptoms.  Patient admits to left-sided facial numbness, left lower extremity is and left lower extremity numbness when compared to the right.  Symptom onset today at 1700.  No preceding symptoms.  Falls or head trauma.  Denies facial asymmetry, slurred speech or gait ataxia.  The history is provided by the patient. No language interpreter was used.       Home Medications Prior to Admission medications   Medication Sig Start Date End Date Taking? Authorizing Provider  albuterol (VENTOLIN HFA) 108 (90 Base) MCG/ACT inhaler Inhale into the lungs. 06/09/16   [provider]  Ascorbic Acid (VITAMIN C) 1000 MG tablet Take 1,000 mg by mouth daily.    [provider]  Azelastine-Fluticasone 137-50 MCG/ACT SUSP Frequency:   Dosage:0   MCG/ACT  Instructions:  Note:Use one spray into each nostril twice a day for allergy. 10/26/12   [provider]  B Complex Vitamins (VITAMIN B-COMPLEX) TABS Take by mouth.    [provider]  b complex vitamins capsule Take 2 capsules by mouth daily.    [provider]  BIOTIN PO Take 1 tablet by mouth daily.    [provider]  Calcium Carbonate-Vitamin D (CALCIUM-VITAMIN D) 600-200 MG-UNIT CAPS Take 4 capsules by mouth daily.    [provider]  Calcium Carbonate-Vitamin D 600-200 MG-UNIT CAPS Take by mouth.    [provider]  cephALEXin (KEFLEX) 500 MG capsule Take 1 capsule (500 mg total) by mouth 4 (four) times daily. 03/24/22   Janell Quiet, PA-C  CINNAMON PO Take 2 tablets by mouth  daily.    [provider]  conjugated estrogens (PREMARIN) vaginal cream  01/12/17   [provider]  cyclobenzaprine (FLEXERIL) 5 MG tablet Take 1 tablet (5 mg total) by mouth 3 (three) times daily as needed for muscle spasms. 02/23/18   Beaulah Dinning, MD  erythromycin ophthalmic ointment Place into both eyes nightly. 02/02/21   [provider]  estradiol (ESTRACE) 0.1 MG/GM vaginal cream Place vaginally. 04/08/18   [provider]  fexofenadine (ALLEGRA) 180 MG tablet Take 180 mg by mouth daily.    [provider]  fluocinonide (LIDEX) 0.05 % external solution APPLY SOLUTION TOPICALLY TO AFFECTED AREA TWICE DAILY 05/23/18   [provider]  fluticasone (FLONASE) 50 MCG/ACT nasal spray Place 2 sprays into the nose daily as needed. For allergies 05/10/13   Emilia Beck, PA-C  gabapentin (NEURONTIN) 100 MG capsule TAKE 1 TO 3 CAPSULES BY MOUTH ONCE DAILY IN THE MORNING AS TOLERATED AND 3 ONCE DAILY AT BEDTIME 08/11/15   [provider]  gabapentin (NEURONTIN) 400 MG capsule Take 400 mg by mouth 3 (three) times daily.    [provider]  guaiFENesin (MUCINEX) 600 MG 12 hr tablet Take 1 tablet (600 mg total) by mouth 2 (two) times daily. 11/03/15   [name], [name], PA-C  hydrochlorothiazide (MICROZIDE) 12.5 MG capsule Take 12.5 mg by mouth daily.    [provider]  HYDROcodone-acetaminophen (NORCO) 5-325 MG tablet Take 1-2 tablets by mouth every 6 (six) hours as needed.  06/14/19   Geoffery Lyons, MD  ibuprofen (ADVIL) 800 MG tablet Take 1 tablet (800 mg total) by mouth 3 (three) times daily. 05/23/21   Prosperi, Christian H, PA-C  ibuprofen (ADVIL) 800 MG tablet Take 1 tablet (800 mg total) by mouth 3 (three) times daily. 03/24/22   Janell Quiet, PA-C  ketoconazole (NIZORAL) 2 % cream APPLY CREAM TOPICALLY TO AFFECTED AREA TWICE DAILY 05/23/18   [provider]  L-LYSINE PO Take 1 tablet by mouth daily.    [provider]  linaclotide Karlene Einstein) 145 MCG CAPS capsule Take by mouth. 10/04/18   [provider]  linaclotide Karlene Einstein) 290 MCG CAPS capsule Take by mouth. 01/04/21   [provider]  loratadine (CLARITIN) 10 MG tablet Take by mouth.    [provider]  LORazepam (ATIVAN) 1 MG tablet Take by mouth. 01/13/21   [provider]  methocarbamol (ROBAXIN) 500 MG tablet Take 1 tablet (500 mg total) by mouth 2 (two) times daily. 05/23/21   Prosperi, Christian H, PA-C  Multiple Vitamins-Minerals (MULTIVITAMIN WITH MINERALS) tablet Take 2 tablets by mouth daily.    [provider]  Multiple Vitamins-Minerals (MULTIVITAMIN WITH MINERALS) tablet Take by mouth.    [provider]  nabumetone (RELAFEN) 500 MG tablet Take 500 mg by mouth daily as needed.    [provider]  nabumetone (RELAFEN) 500 MG tablet Take 1 tablet by mouth twice daily as needed for pain 11/16/14   [provider]  nabumetone (RELAFEN) 750 MG tablet Take by mouth. 12/06/20   [provider]  NON FORMULARY Take 2-4 capsules by mouth daily. Rosemary capsule    [provider]  NON FORMULARY Take 2-4 capsules by mouth daily. Horsetail capsule    [provider]  NON FORMULARY     [provider]  triamcinolone (NASACORT AQ) 55 MCG/ACT AERO nasal inhaler Place 2 sprays into the nose daily. 11/03/15   [name], [name], PA-C      Allergies    Aspirin, Codeine, Doxycycline, and Other    Review of Systems   Review of Systems  Constitutional:  Negative for chills and fever.  HENT:  Negative for ear pain and sore throat.   Eyes:  Negative for pain and visual disturbance.  Respiratory:  Negative for cough and shortness of breath.   Cardiovascular:  Negative for chest pain and palpitations.  Gastrointestinal:  Negative for abdominal pain and vomiting.  Genitourinary:  Negative for dysuria and hematuria.  Musculoskeletal:  Negative for  arthralgias and back pain.  Skin:  Negative for color change and rash.  Neurological:  Positive for weakness and numbness. Negative for seizures and syncope.  All other systems reviewed and are negative.   Physical Exam Updated Vital Signs BP (!) 150/82   Pulse 73   Temp 99.2 F (37.3 C) (Oral)   Resp 19   Wt 67.7 kg   SpO2 100%   BMI 24.84 kg/m  Physical Exam Vitals and nursing note reviewed.  Constitutional:      General: She is not in acute distress.    Appearance: She is well-developed.  HENT:     Head: Normocephalic and atraumatic.  Eyes:     General: Lids are normal. Vision grossly intact.     Conjunctiva/sclera: Conjunctivae normal.  Cardiovascular:     Rate and Rhythm: Normal rate and regular rhythm.     Heart sounds: No murmur heard. Pulmonary:     Effort: Pulmonary effort is  normal. No respiratory distress.     Breath sounds: Normal breath sounds.  Abdominal:     Palpations: Abdomen is soft.     Tenderness: There is no abdominal tenderness.  Musculoskeletal:        General: No swelling.     Cervical back: Neck supple.  Skin:    General: Skin is warm and dry.     Capillary Refill: Capillary refill takes less than 2 seconds.  Neurological:     Mental Status: She is alert and oriented to person, place, and time.     GCS: GCS eye subscore is 4. GCS verbal subscore is 5. GCS motor subscore is 6.     Cranial Nerves: Cranial nerves 2-12 are intact.     Sensory: Sensory deficit present.     Motor: Weakness present.  Psychiatric:        Mood and Affect: Mood normal.     ED Results / Procedures / Treatments   Labs (all labs ordered are listed, but only abnormal results are displayed) Labs Reviewed  COMPREHENSIVE METABOLIC PANEL - Abnormal; Notable for the following components:      Result Value   Glucose, Bld 126 (*)    All other components within normal limits  URINALYSIS, ROUTINE W REFLEX MICROSCOPIC - Abnormal; Notable for the following components:    Leukocytes,Ua SMALL (*)    All other components within normal limits  URINALYSIS, MICROSCOPIC (REFLEX) - Abnormal; Notable for the following components:   Bacteria, UA RARE (*)    All other components within normal limits  CBG MONITORING, ED - Abnormal; Notable for the following components:   Glucose-Capillary 117 (*)    All other components within normal limits  ETHANOL  PROTIME-INR  APTT  CBC  DIFFERENTIAL  RAPID URINE DRUG SCREEN, HOSP PERFORMED    EKG None  Radiology CT ANGIO HEAD NECK W WO CM  Result Date: 04/03/2022 CLINICAL DATA:  Stroke, follow up. 71 y/o female here for numbness in her right leg and face around lips. EXAM: CT ANGIOGRAPHY HEAD AND NECK TECHNIQUE: Multidetector CT imaging of the head and neck was performed using the standard protocol during bolus administration of intravenous contrast. Multiplanar CT image reconstructions and MIPs were obtained to evaluate the vascular anatomy. Carotid stenosis measurements (when applicable) are obtained utilizing NASCET criteria, using the distal internal carotid diameter as the denominator. RADIATION DOSE REDUCTION: This exam was performed according to the departmental dose-optimization program which includes automated exposure control, adjustment of the mA and/or kV according to patient size and/or use of iterative reconstruction technique. CONTRAST:  106mL OMNIPAQUE IOHEXOL 350 MG/ML SOLN COMPARISON:  None Available. FINDINGS: CT HEAD FINDINGS Brain: There is no mass, hemorrhage or extra-axial collection. The size and configuration of the ventricles and extra-axial CSF spaces are normal. There is no acute or chronic infarction. The brain parenchyma is normal. Skull: The visualized skull base, calvarium and extracranial soft tissues are normal. Sinuses/Orbits: No fluid levels or advanced mucosal thickening of the visualized paranasal sinuses. No mastoid or middle ear effusion. The orbits are normal. CTA NECK FINDINGS SKELETON: There is  no bony spinal canal stenosis. No lytic or blastic lesion. OTHER NECK: Normal pharynx, larynx and major salivary glands. No cervical lymphadenopathy. Unremarkable thyroid gland. UPPER CHEST: No pneumothorax or pleural effusion. No nodules or masses. AORTIC ARCH: There is no calcific atherosclerosis of the aortic arch. There is no aneurysm, dissection or hemodynamically significant stenosis of the visualized portion of the aorta. Conventional 3 vessel aortic  branching pattern. The visualized proximal subclavian arteries are widely patent. RIGHT CAROTID SYSTEM: Normal without aneurysm, dissection or stenosis. LEFT CAROTID SYSTEM: Normal without aneurysm, dissection or stenosis. VERTEBRAL ARTERIES: Left dominant configuration. Both origins are clearly patent. There is no dissection, occlusion or flow-limiting stenosis to the skull base (V1-V3 segments). CTA HEAD FINDINGS POSTERIOR CIRCULATION: --Vertebral arteries: Normal V4 segments. --Inferior cerebellar arteries: Normal. --Basilar artery: Normal. --Superior cerebellar arteries: Normal. --Posterior cerebral arteries (PCA): Normal. ANTERIOR CIRCULATION: --Intracranial internal carotid arteries: Normal. --Anterior cerebral arteries (ACA): Normal. Both A1 segments are present. Patent anterior communicating artery (a-comm). --Middle cerebral arteries (MCA): Normal. VENOUS SINUSES: As permitted by contrast timing, patent. ANATOMIC VARIANTS: None Review of the MIP images confirms the above findings. IMPRESSION: Normal CTA of the head and neck. Electronically Signed   By: Deatra Robinson M.D.   On: 04/03/2022 20:25   CT HEAD CODE STROKE WO CONTRAST  Result Date: 04/03/2022 CLINICAL DATA:  Code stroke. Neuro deficit, acute, stroke suspected. EXAM: CT HEAD WITHOUT CONTRAST TECHNIQUE: Contiguous axial images were obtained from the base of the skull through the vertex without intravenous contrast. RADIATION DOSE REDUCTION: This exam was performed according to the departmental  dose-optimization program which includes automated exposure control, adjustment of the mA and/or kV according to patient size and/or use of iterative reconstruction technique. COMPARISON:  05/23/2021 FINDINGS: Brain: No acute CT finding. There are mild chronic small-vessel ischemic changes of the white matter. No mass lesion, hemorrhage, hydrocephalus or extra-axial collection. Vascular: No abnormal vascular finding. Skull: Negative Sinuses/Orbits: Clear/normal Other: None ASPECTS (Alberta Stroke Program Early CT Score) - Ganglionic level infarction (caudate, lentiform nuclei, internal capsule, insula, M1-M3 cortex): 7 - Supraganglionic infarction (M4-M6 cortex): 3 Total score (0-10 with 10 being normal): 10 IMPRESSION: 1. Mild chronic small-vessel ischemic change of the cerebral hemispheric white matter. No acute CT finding. 2. ASPECTS is 10 3. These results were called by telephone at the time of interpretation on 04/03/2022 at 6:49 pm to provider EDP , who verbally acknowledged these results. Electronically Signed   By: Paulina Fusi M.D.   On: 04/03/2022 18:51    Procedures Procedures  {Document cardiac monitor, telemetry assessment procedure when appropriate:1}  Medications Ordered in ED Medications  iohexol (OMNIPAQUE) 350 MG/ML injection 75 mL (75 mLs Intravenous Contrast Given 04/03/22 1950)    ED Course/ Medical Decision Making/ A&P                           Medical Decision Making Amount and/or Complexity of Data Reviewed Radiology: ordered.  Risk Prescription drug management.  71 year old female with past medical history of hypertension hyperlipidemia presenting for left-sided facial numbness, left lower extremity is and left lower extremity numbness when compared to the right started today at 1700.  Patient is alert and oriented x3 on exam, no acute distress, afebrile, stable vital signs.  Physical exam demonstrates active sensation deficits on the right face and right lower extremity.   Patient also has weakness on the right lower extremity is unable to lift her leg off the bed.  Night stroke scale 3.    Telemetry stroke activated.  CT head demonstrates no acute process. CTA head and neck demonstrates no acute process. Recommendations from neurology every neurochecks, call if worsening neurological symptoms, admit for MRI.  At this bedside, patient having worsening of sensation deficits and right lower extremity motor deficits. Telemetry neurology reconsulted.  {Document critical care time when appropriate:1} {Document review of labs  and clinical decision tools ie heart score, Chads2Vasc2 etc:1}  {Document your independent review of radiology images, and any outside records:1} {Document your discussion with family members, caretakers, and with consultants:1} {Document social determinants of health affecting pt's care:1} {Document your decision making why or why not admission, treatments were needed:1} Final Clinical Impression(s) / ED Diagnoses Final diagnoses:  None    Rx / DC Orders ED Discharge Orders     None

## 2022-04-03 NOTE — Progress Notes (Addendum)
1839 [place]  LNW 1700 - R facial numbness, R Leg numbness and weakness 1840 Pt to CT 1844 Page sent to Dr. Selina Cooley 1850 Pt back from CT 1851 Dr. Selina Cooley on screen   Report to Herbert Seta, [place] RN

## 2022-04-03 NOTE — Consult Note (Signed)
NEUROLOGY TELECONSULTATION NOTE   Date of service: April 03, 2022 Patient Name: April Hunt MRN:  [id number] DOB:  1951/08/14 Reason for consult: telestroke  Requesting Provider: Dr. Edwin Dada Consult Participants: myself, patient, bedside RN, telestroke RN Location of the provider: [facility], [place] Location of the patient:   This consult was provided via telemedicine with 2-way video and audio communication. The patient/family was informed that care would be provided in this way and agreed to receive care in this manner.   _ _ _   _ __   _ __ _ _  __ __   _ __   __ _  History of Present Illness   71 yo woman with hx HTN, unable to receive blood products 2/2 being a Jehovah's witness, who presented with R face and RLE numbness and RLE weakness that started at 1700. Just prior to 1700 she was completely normal. She is not on anticoagulation. When she was taking a shower she noted that her RLE was weak and numb and her R face was numb too. On arrival to the ED she was able to move her leg but not lift it off the bed and the numbness was still present. Sx rapidly improved and on my tele examination her weakness had resolved and NIHSS = 1 for numbness in her RLE only. CT head NAICP personal review. TNK was not administered 2/2 mild and rapidly improving sx. CTA H&N was not performed as part of the stroke code bc exam not c/w LVO.    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   History of shingles    Hypertension    Joint pain 03/19/2012   Neck pain 12/25/2011   Osteoarthritis of right knee 12/26/2013   Otitis media 03/19/2012   Palpitations    PONV (postoperative nausea and vomiting)    Positive PPD, treated    Pure hypercholesterolemia    Refusal of blood transfusions as patient is Jehovah's Witness    Sinusitis 03/04/2013   Tinea pedis 03/19/2012   Tinea versicolor 03/19/2012   Past Surgical History:  Procedure Laterality Date   ABDOMINAL HYSTERECTOMY  1998   Still has cervix and both ovaries   APPENDECTOMY     BACK SURGERY     JOINT REPLACEMENT     scope bil knees   KNEE ARTHROSCOPY  1997   right knee   Lymph node resection  1999   benign   MENISCUS REPAIR  1995   left knee   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   DR CAFFREY   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   Procedure: RIGHT TOTAL KNEE ARTHROPLASTY;  Surgeon: Thera Flake., MD;  Location: [facility] OR;  Service: Orthopedics;  Laterality: Right;   Family History  Problem Relation Age of Onset   Hyperlipidemia Mother  Hypertension Mother    Diabetes Father    Tuberculosis Father    Heart disease Paternal Aunt    Cancer Paternal Uncle        colon cancer   Cancer Cousin        breast   Social History   Socioeconomic History   Marital status: Divorced    Spouse name: Not on file   Number of children: 1   Years of education: Not on file   Highest education level: Not on file  Occupational History   Occupation: TEACHER    Employer: [employer]  Tobacco Use   Smoking status: Never   Smokeless tobacco: Never  Vaping Use   Vaping Use: Never used  Substance and Sexual Activity   Alcohol use: Not Currently    Comment: rarely   Drug use: No   Sexual activity: Not on file  Other Topics Concern   Not on file  Social History Narrative   Caffeine Use: "all the time"   Regular exercise:  Walks daily.   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   Divorced   She has one son age 59.   Lives with her mother and her dog [place]- brittany spaniel   Social Determinants of Health   Financial Resource Strain: Not on file  Food Insecurity: Not on file  Transportation Needs: Not on file  Physical Activity: Not on file  Stress: Not on file  Social Connections: Not on file   Allergies  Allergen Reactions   Aspirin Nausea And Vomiting and Rash   Codeine Other (See Comments)    Jittery   Doxycycline Nausea Only   Other Other (See Comments)    NO BLOOD OR BLOOD PRODUCTS NO BLOOD OR BLOOD PRODUCTS    Medications   (Not in a hospital admission)    No current facility-administered medications for this encounter.  Current Outpatient Medications:    albuterol (VENTOLIN HFA) 108 (90 Base) MCG/ACT inhaler, Inhale into the lungs., Disp: , Rfl:    Ascorbic Acid (VITAMIN C) 1000 MG tablet, Take 1,000 mg by mouth daily., Disp: , Rfl:    Azelastine-Fluticasone 137-50 MCG/ACT SUSP, Frequency:   Dosage:0   MCG/ACT  Instructions:  Note:Use one spray into each nostril twice a day for allergy., Disp: , Rfl:    B Complex Vitamins (VITAMIN B-COMPLEX) TABS, Take by mouth., Disp: , Rfl:    b complex vitamins capsule, Take 2 capsules by mouth daily., Disp: , Rfl:    BIOTIN PO, Take 1 tablet by mouth daily., Disp: , Rfl:    Calcium Carbonate-Vitamin D (CALCIUM-VITAMIN D) 600-200 MG-UNIT CAPS, Take 4 capsules by mouth daily., Disp: , Rfl:    Calcium Carbonate-Vitamin D 600-200 MG-UNIT CAPS, Take by mouth., Disp: , Rfl:    cephALEXin (KEFLEX) 500 MG capsule, Take 1 capsule (500 mg total) by mouth 4 (four) times daily., Disp: 20 capsule, Rfl: 0   CINNAMON PO, Take 2 tablets by mouth daily., Disp: , Rfl:    conjugated estrogens (PREMARIN) vaginal cream, , Disp: , Rfl:    cyclobenzaprine (FLEXERIL) 5 MG tablet, Take 1 tablet (5 mg total) by mouth 3 (three) times daily as needed for muscle spasms., Disp: 20 tablet, Rfl: 0    erythromycin ophthalmic ointment, Place into both eyes nightly., Disp: , Rfl:    estradiol (ESTRACE) 0.1 MG/GM vaginal cream, Place vaginally., Disp: , Rfl:    fexofenadine (ALLEGRA) 180 MG tablet, Take 180 mg by  mouth daily., Disp: , Rfl:    fluocinonide (LIDEX) 0.05 % external solution, APPLY SOLUTION TOPICALLY TO AFFECTED AREA TWICE DAILY, Disp: , Rfl:    fluticasone (FLONASE) 50 MCG/ACT nasal spray, Place 2 sprays into the nose daily as needed. For allergies, Disp: , Rfl:    gabapentin (NEURONTIN) 100 MG capsule, TAKE 1 TO 3 CAPSULES BY MOUTH ONCE DAILY IN THE MORNING AS TOLERATED AND 3 ONCE DAILY AT BEDTIME, Disp: , Rfl:    gabapentin (NEURONTIN) 400 MG capsule, Take 400 mg by mouth 3 (three) times daily., Disp: , Rfl:    guaiFENesin (MUCINEX) 600 MG 12 hr tablet, Take 1 tablet (600 mg total) by mouth 2 (two) times daily., Disp: 15 tablet, Rfl: 0   hydrochlorothiazide (MICROZIDE) 12.5 MG capsule, Take 12.5 mg by mouth daily., Disp: , Rfl:    HYDROcodone-acetaminophen (NORCO) 5-325 MG tablet, Take 1-2 tablets by mouth every 6 (six) hours as needed., Disp: 15 tablet, Rfl: 0   ibuprofen (ADVIL) 800 MG tablet, Take 1 tablet (800 mg total) by mouth 3 (three) times daily., Disp: 21 tablet, Rfl: 0   ibuprofen (ADVIL) 800 MG tablet, Take 1 tablet (800 mg total) by mouth 3 (three) times daily., Disp: 21 tablet, Rfl: 0   ketoconazole (NIZORAL) 2 % cream, APPLY CREAM TOPICALLY TO AFFECTED AREA TWICE DAILY, Disp: , Rfl:    L-LYSINE PO, Take 1 tablet by mouth daily., Disp: , Rfl:    linaclotide (LINZESS) 145 MCG CAPS capsule, Take by mouth., Disp: , Rfl:    linaclotide (LINZESS) 290 MCG CAPS capsule, Take by mouth., Disp: , Rfl:    loratadine (CLARITIN) 10 MG tablet, Take by mouth., Disp: , Rfl:    LORazepam (ATIVAN) 1 MG tablet, Take by mouth., Disp: , Rfl:    methocarbamol (ROBAXIN) 500 MG tablet, Take 1 tablet (500 mg total) by mouth 2 (two) times daily., Disp: 20 tablet, Rfl: 0   Multiple  Vitamins-Minerals (MULTIVITAMIN WITH MINERALS) tablet, Take 2 tablets by mouth daily., Disp: , Rfl:    Multiple Vitamins-Minerals (MULTIVITAMIN WITH MINERALS) tablet, Take by mouth., Disp: , Rfl:    nabumetone (RELAFEN) 500 MG tablet, Take 500 mg by mouth daily as needed., Disp: , Rfl:    nabumetone (RELAFEN) 500 MG tablet, Take 1 tablet by mouth twice daily as needed for pain, Disp: , Rfl:    nabumetone (RELAFEN) 750 MG tablet, Take by mouth., Disp: , Rfl:    NON FORMULARY, Take 2-4 capsules by mouth daily. Rosemary capsule, Disp: , Rfl:    NON FORMULARY, Take 2-4 capsules by mouth daily. Horsetail capsule, Disp: , Rfl:    NON FORMULARY, , Disp: , Rfl:    triamcinolone (NASACORT AQ) 55 MCG/ACT AERO nasal inhaler, Place 2 sprays into the nose daily., Disp: 1 Inhaler, Rfl: 12  Vitals   Vitals:   04/03/22 1834 04/03/22 1838 04/03/22 1851 04/03/22 1900  BP:  (!) 167/89 (!) 165/76 (!) 155/80  Pulse:  90 88 87  Resp:  16 13 (!) 22  SpO2:  98% 100% 100%  Weight: 67.7 kg        Body mass index is 24.84 kg/m.  Physical Exam   Exam performed over telemedicine with 2-way video and audio communication and with assistance of bedside RN  Physical Exam Gen: A&O x4, NAD Resp: normal WOB CV: extremities appear well-perfused  Neuro: *MS: A&O x4. Follows multi-step commands.  *Speech: nondysarthric, no aphasia, able to name and repeat *CN: PERRL 40mm, EOMI, VFF by  confrontation, sensation intact, smile symmetric, hearing intact to voice *Motor:   Normal bulk.  No tremor, rigidity or bradykinesia. No pronator drift. All extremities appear full-strength and symmetric. *Sensory: Sensory deficit to LT RLE only, otherwise SILT. No double-simultaneous extinction.  *Coordination:  Finger-to-nose, heel-to-shin, rapid alternating motions were intact. *Reflexes:  UTA 2/2 tele-exam *Gait: deferred  NIHSS = 1 for sensory deficit  Premorbid mRS = 0   Labs   CBC: No results for input(s): "WBC",  "NEUTROABS", "HGB", "HCT", "MCV", "PLT" in the last 168 hours.  Basic Metabolic Panel:  Lab Results  Component Value Date   NA 139 02/04/2019   K 4.2 02/04/2019   CO2 27 02/04/2019   GLUCOSE 92 02/04/2019   BUN 10 02/04/2019   CREATININE 0.83 02/04/2019   CALCIUM 9.1 02/04/2019   GFRNONAA >60 02/04/2019   GFRAA >60 02/04/2019   Lipid Panel:  Lab Results  Component Value Date   LDLCALC 121 (H) 03/19/2012   HgbA1c: No results found for: "HGBA1C" Urine Drug Screen: No results found for: "LABOPIA", "COCAINSCRNUR", "LABBENZ", "AMPHETMU", "THCU", "LABBARB"  Alcohol Level No results found for: "ETH"   Impression   71 yo woman with hx HTN, unable to receive blood products 2/2 being a Jehovah's witness, who presented with R face and RLE numbness and RLE weakness that started at 1700. She is not on anticoagulation. Sx rapidly improved and on my tele examination her weakness had resolved and NIHSS = 1 for numbness in her RLE only. CT head NAICP personal review. TNK was not administered 2/2 mild and rapidly improving sx. CTA H&N was not performed as part of the stroke code bc exam not c/w LVO.   Recommendations   - Monitor in [facility] ED until she is outside the TNK window (9:30pm) with vitals q 15 min and NIHSS q 30. If her sx get worse telestroke should be re-activated for consideration of TNK.  - Obtain CTA H&N in [facility] ED. If CTA shows urgent findings page me before 8pm or telespecialists after that.  - If sx do not worsen by 9:30pm then at that point OK to direct admit to [facility] for stroke workup.  - The following should be ordered on admission to [facility] as long as patient does not receive TNK: - Permissive HTN x48 hrs from sx onset or until stroke ruled out by MRI goal BP <220/110. PRN labetalol or hydralazine if BP above these parameters. Avoid oral antihypertensives. - MRI brain wo contrast - TTE w/ bubble - Check A1c and LDL + add statin per guidelines - ASA 81mg   daily + plavix 75mg  daily x21 days f/b ASA 81mg  daily monotherapy after that - q4 hr neuro checks - STAT head CT for any change in neuro exam - Tele - PT/OT/SLP - Stroke education - Amb referral to neurology upon discharge   Plan discussed with Dr. at [facility]  Patient will be followed by the stroke consult service after arrival to [facility]  Any copied and pasted documentation in this note was written by me in another application (not separately billed for) and pasted here.  ______________________________________________________________________   Thank you for the opportunity to take part in the care of this patient. If you have any further questions, please contact the neurology consultation attending.  Signed,  Edwin Dada, MD [facility] [phone]  If 7pm- 7am, please page neurology on call as listed in AMION.

## 2022-04-03 NOTE — ED Notes (Signed)
Pt request to use wheel chair to restroom. Pt able to transfer without assistance.

## 2022-04-03 NOTE — ED Notes (Signed)
Teleneuro activated and at bedside, Armed forces training and education officer on video

## 2022-04-03 NOTE — ED Notes (Addendum)
Pt wheeled to restroom to obtain urine sample. Pt was able to ambulate without assistance with steady gait, except for being pushed to and from restroom.

## 2022-04-03 NOTE — ED Notes (Signed)
Pt transported to CT ?

## 2022-04-03 NOTE — Progress Notes (Addendum)
NIH done 1851 mRS 0, Dr. Selina Cooley reviewed scans and given results

## 2022-04-04 ENCOUNTER — Encounter (HOSPITAL_COMMUNITY): Payer: Self-pay | Admitting: Internal Medicine

## 2022-04-04 DIAGNOSIS — I10 Essential (primary) hypertension: Secondary | ICD-10-CM

## 2022-04-04 DIAGNOSIS — Z96651 Presence of right artificial knee joint: Secondary | ICD-10-CM | POA: Diagnosis not present

## 2022-04-04 DIAGNOSIS — R41841 Cognitive communication deficit: Secondary | ICD-10-CM | POA: Diagnosis not present

## 2022-04-04 DIAGNOSIS — K219 Gastro-esophageal reflux disease without esophagitis: Secondary | ICD-10-CM

## 2022-04-04 DIAGNOSIS — Z79899 Other long term (current) drug therapy: Secondary | ICD-10-CM | POA: Diagnosis not present

## 2022-04-04 DIAGNOSIS — Z9889 Other specified postprocedural states: Secondary | ICD-10-CM | POA: Diagnosis not present

## 2022-04-04 DIAGNOSIS — J4520 Mild intermittent asthma, uncomplicated: Secondary | ICD-10-CM | POA: Diagnosis not present

## 2022-04-04 DIAGNOSIS — I639 Cerebral infarction, unspecified: Secondary | ICD-10-CM | POA: Diagnosis not present

## 2022-04-04 DIAGNOSIS — R202 Paresthesia of skin: Secondary | ICD-10-CM | POA: Diagnosis present

## 2022-04-04 MED ORDER — ACETAMINOPHEN 325 MG PO TABS: 650.0000 mg | ORAL_TABLET | Freq: Four times a day (QID) | ORAL | Status: DC | PRN

## 2022-04-04 MED ORDER — HYDRALAZINE HCL 20 MG/ML IJ SOLN: 10.0000 mg | Freq: Four times a day (QID) | INTRAMUSCULAR | Status: DC | PRN

## 2022-04-04 MED ORDER — PANTOPRAZOLE SODIUM 40 MG PO TBEC
40.0000 mg | DELAYED_RELEASE_TABLET | Freq: Every day | ORAL | Status: DC
Start: 2022-04-04 — End: 2022-04-05
  Administered 2022-04-04 – 2022-04-05 (×2): 40 mg via ORAL
  Filled 2022-04-04 (×2): qty 1

## 2022-04-04 MED ORDER — ACETAMINOPHEN 650 MG RE SUPP: 650.0000 mg | Freq: Four times a day (QID) | RECTAL | Status: DC | PRN

## 2022-04-04 MED ORDER — ATORVASTATIN CALCIUM 10 MG PO TABS
20.0000 mg | ORAL_TABLET | Freq: Every day | ORAL | Status: DC
  Filled 2022-04-04: qty 2

## 2022-04-04 MED ORDER — ONDANSETRON HCL 4 MG PO TABS: 4.0000 mg | ORAL_TABLET | Freq: Four times a day (QID) | ORAL | Status: DC | PRN

## 2022-04-04 MED ORDER — POLYETHYLENE GLYCOL 3350 17 G PO PACK: 17.0000 g | PACK | Freq: Every day | ORAL | Status: DC | PRN

## 2022-04-04 MED ORDER — ONDANSETRON HCL 4 MG/2ML IJ SOLN: 4.0000 mg | Freq: Four times a day (QID) | INTRAMUSCULAR | Status: DC | PRN

## 2022-04-04 MED ORDER — ENOXAPARIN SODIUM 40 MG/0.4ML IJ SOSY
40.0000 mg | PREFILLED_SYRINGE | INTRAMUSCULAR | Status: DC
  Administered 2022-04-04: 40 mg via SUBCUTANEOUS
  Filled 2022-04-04: qty 0.4

## 2022-04-04 MED ORDER — ALBUTEROL SULFATE (2.5 MG/3ML) 0.083% IN NEBU
2.5000 mg | INHALATION_SOLUTION | RESPIRATORY_TRACT | Status: DC | PRN
Start: 2022-04-04 — End: 2022-04-05

## 2022-04-04 MED ORDER — LORAZEPAM 2 MG/ML IJ SOLN
1.0000 mg | INTRAMUSCULAR | Status: AC
  Administered 2022-04-05: 1 mg via INTRAVENOUS
  Filled 2022-04-04: qty 1

## 2022-04-04 MED ORDER — CLOPIDOGREL BISULFATE 75 MG PO TABS
75.0000 mg | ORAL_TABLET | Freq: Every day | ORAL | Status: DC
  Administered 2022-04-04 – 2022-04-05 (×2): 75 mg via ORAL
  Filled 2022-04-04 (×2): qty 1

## 2022-04-04 MED ORDER — NABUMETONE 500 MG PO TABS
500.0000 mg | ORAL_TABLET | Freq: Two times a day (BID) | ORAL | Status: DC | PRN
Start: 2022-04-04 — End: 2022-04-05
  Administered 2022-04-05: 500 mg via ORAL
  Filled 2022-04-04 (×2): qty 1

## 2022-04-04 MED ORDER — ASPIRIN 81 MG PO TBEC
81.0000 mg | DELAYED_RELEASE_TABLET | Freq: Every day | ORAL | Status: DC
  Administered 2022-04-04: 81 mg via ORAL
  Filled 2022-04-04: qty 1

## 2022-04-04 MED ORDER — LINACLOTIDE 290 MCG PO CAPS: 290.0000 ug | ORAL_CAPSULE | Freq: Every day | ORAL | Status: DC

## 2022-04-04 MED ORDER — LINACLOTIDE 145 MCG PO CAPS
290.0000 ug | ORAL_CAPSULE | Freq: Every day | ORAL | Status: DC
  Administered 2022-04-04: 290 ug via ORAL
  Filled 2022-04-04 (×3): qty 1
  Filled 2022-04-04 (×2): qty 2

## 2022-04-04 MED ORDER — STROKE: EARLY STAGES OF RECOVERY BOOK
Freq: Once | Status: AC
  Filled 2022-04-04: qty 1

## 2022-04-04 MED ORDER — LORAZEPAM 2 MG/ML IJ SOLN: 0.5000 mg | Freq: Once | INTRAMUSCULAR | Status: DC

## 2022-04-04 MED ORDER — ASPIRIN 81 MG PO TBEC: 81.0000 mg | DELAYED_RELEASE_TABLET | Freq: Every day | ORAL | Status: DC

## 2022-04-04 NOTE — Assessment & Plan Note (Signed)
Protonix 40 mg daily

## 2022-04-04 NOTE — Assessment & Plan Note (Signed)
?   No evidence of acute asthma exacerbation ?? As needed bronchodilator therapy for shortness of breath and wheezing. ? ?

## 2022-04-04 NOTE — H&P (Signed)
History and Physical    Patient: April Hunt MRN: [id number] DOA: 04/03/2022  Date of Service: the patient was seen and examined on 04/04/2022  Patient coming from: Home via [facility] ED  Chief Complaint:  Chief Complaint  Patient presents with   Numbness    HPI:   71 year old Jehovah's Witness female with past medical history of gastroesophageal reflux disease, hypertension, hyperlipidemia, asthma who presented to med [facility] emergency department due to complaints of right leg weakness along with right face numbness and tingling.  Patient explains that at approximately 5 PM on 7/17 she was taking a shower when she noted that her right lower extremity was beginning to feel weak.  This is associated with numbness and tingling along the extremity.  Patient also noted that her right face was also beginning to feel numb as well.  Patient denies any slurred speech, headache or visual changes along with the symptoms.  Patient quickly presented to med [facility] emergency department and arrived at approximately 6:30 PM for evaluation.  Upon evaluation at [facility] emergency department noncontrast CT imaging of the head was unremarkable.  Patient underwent prompt evaluation by neurology at 6:50 PM but by that time patient's neurologic symptoms seem to be improving somewhat. Therefore TNK was not administered.  Despite this, Dr. Selina Cooley with neurology recommended hospitalization for further work-up and therefore the hospitalist group was called and accepted for transfer to [facility] for further management.  Upon my discussion with the patient on the medical floor she complains of continued right lower extremity weakness, albeit somewhat improved compared to prior as well as continued right facial tingling.  Patient denies any other new neurologic complaint and denies headaches.   Review of Systems: Review of Systems  Neurological:  Positive for tingling,  sensory change and focal weakness.     Past Medical History:  Diagnosis Date   Allergy    seasonal   Arthritis    Asthma 12/25/2011   Asthma 12/25/2011   Formatting of this note might be different from the original. Last Assessment & Plan:  Currently stable with PRN albuterol.   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   Pure hypercholesterolemia    Refusal of blood transfusions as patient is Jehovah's Witness    Sinusitis 03/04/2013   Tinea pedis 03/19/2012   Tinea versicolor 03/19/2012    Past Surgical History:  Procedure Laterality Date   ABDOMINAL HYSTERECTOMY  1998   Still has cervix and both ovaries   APPENDECTOMY     BACK SURGERY  JOINT REPLACEMENT     scope bil knees   KNEE ARTHROSCOPY  1997   right knee   Lymph node resection  1999   benign   MENISCUS REPAIR  1995   left knee   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   DR CAFFREY   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   Procedure: RIGHT TOTAL KNEE ARTHROPLASTY;  Surgeon: Thera FlakeW D Caffrey Jr., MD;  Location: [facility] OR;  Service: Orthopedics;  Laterality: Right;    Social History:  reports that she has  never smoked. She has never used smokeless tobacco. She reports that she does not currently use alcohol. She reports that she does not use drugs.  Allergies  Allergen Reactions   Aspirin Nausea And Vomiting and Nausea Only    Patient reports mild GI intolerance only if she takes it without food.    Codeine Other (See Comments)    Jittery   Doxycycline Nausea Only   Other Other (See Comments)    NO BLOOD OR BLOOD PRODUCTS NO BLOOD OR BLOOD PRODUCTS    Family History  Problem Relation Age of Onset   Hyperlipidemia Mother    Hypertension Mother    Diabetes Father    Tuberculosis Father    Heart disease Paternal Aunt    Cancer Paternal Uncle        colon cancer   Cancer Cousin        breast    Prior to Admission medications   Medication Sig Start Date End Date Taking? Authorizing Provider  albuterol (VENTOLIN HFA) 108 (90 Base) MCG/ACT inhaler Inhale 1 puff into the lungs every 4 (four) hours as needed for shortness of breath. 06/09/16   [provider]  Ascorbic Acid (VITAMIN C) 1000 MG tablet Take 1,000 mg by mouth daily.    [provider]  Azelastine-Fluticasone 137-50 MCG/ACT SUSP Frequency:   Dosage:0   MCG/ACT  Instructions:  Note:Use one spray into each nostril twice a day for allergy. 10/26/12   [provider]  B Complex Vitamins (VITAMIN B-COMPLEX) TABS Take 1 tablet by mouth daily.    [provider]  b complex vitamins capsule Take 2 capsules by mouth daily.    [provider]  BIOTIN PO Take 1 tablet by mouth daily.    [provider]  Calcium Carbonate-Vitamin D (CALCIUM-VITAMIN D) 600-200 MG-UNIT CAPS Take 4 capsules by mouth daily.    [provider]  Calcium Carbonate-Vitamin D 600-200 MG-UNIT CAPS Take 1 capsule by mouth daily.    [provider]  cephALEXin (KEFLEX) 500 MG capsule Take 1 capsule (500 mg total) by mouth 4 (four) times daily. 03/24/22   Janell Quietonklin, Erica R, PA-C  CINNAMON PO Take 2  tablets by mouth daily.    [provider]  conjugated estrogens (PREMARIN) vaginal cream Place 1 Applicatorful vaginally at bedtime. 01/12/17   [provider]  cyclobenzaprine (FLEXERIL) 5 MG tablet Take 1 tablet (5 mg total) by mouth 3 (three) times daily as needed for muscle spasms. 02/23/18   Beaulah DinningGambino, Christina M, MD  erythromycin ophthalmic ointment Place into both eyes nightly. 02/02/21   [provider]  estradiol (ESTRACE) 0.1 MG/GM vaginal cream Place vaginally. 04/08/18   [provider]  fexofenadine (ALLEGRA) 180 MG tablet Take 180 mg by mouth daily.    [provider]  fluocinonide (LIDEX) 0.05 % external solution APPLY SOLUTION TOPICALLY TO AFFECTED AREA TWICE DAILY 05/23/18   [provider]  fluticasone (FLONASE) 50 MCG/ACT  nasal spray Place 2 sprays into the nose daily as needed. For allergies 05/10/13   Emilia Beck, PA-C  gabapentin (NEURONTIN) 100 MG capsule Take 100 mg by mouth 2 (two) times daily. 08/11/15   [provider]  gabapentin (NEURONTIN) 400 MG capsule Take 400 mg by mouth 3 (three) times daily.    [provider]  guaiFENesin (MUCINEX) 600 MG 12 hr tablet Take 1 tablet (600 mg total) by mouth 2 (two) times daily. 11/03/15   [name], [name], PA-C  hydrochlorothiazide (MICROZIDE) 12.5 MG capsule Take 12.5 mg by mouth daily.    [provider]  HYDROcodone-acetaminophen (NORCO) 5-325 MG tablet Take 1-2 tablets by mouth every 6 (six) hours as needed. 06/14/19   Geoffery Lyons, MD  ibuprofen (ADVIL) 800 MG tablet Take 1 tablet (800 mg total) by mouth 3 (three) times daily. 05/23/21   Prosperi, Christian H, PA-C  ibuprofen (ADVIL) 800 MG tablet Take 1 tablet (800 mg total) by mouth 3 (three) times daily. 03/24/22   Janell Quiet, PA-C  ketoconazole (NIZORAL) 2 % cream Apply 1 Application topically daily as needed for irritation. 05/23/18   [provider]  L-LYSINE PO Take 1 tablet by mouth  daily.    [provider]  linaclotide Karlene Einstein) 145 MCG CAPS capsule Take by mouth. 10/04/18   [provider]  linaclotide Karlene Einstein) 290 MCG CAPS capsule Take by mouth. 01/04/21   [provider]  loratadine (CLARITIN) 10 MG tablet Take 10 mg by mouth daily.    [provider]  LORazepam (ATIVAN) 1 MG tablet Take by mouth. 01/13/21   [provider]  methocarbamol (ROBAXIN) 500 MG tablet Take 1 tablet (500 mg total) by mouth 2 (two) times daily. 05/23/21   Prosperi, Christian H, PA-C  Multiple Vitamins-Minerals (MULTIVITAMIN WITH MINERALS) tablet Take 2 tablets by mouth daily.    [provider]  Multiple Vitamins-Minerals (MULTIVITAMIN WITH MINERALS) tablet Take by mouth.    [provider]  nabumetone (RELAFEN) 500 MG tablet Take 500 mg by mouth daily as needed.    [provider]  nabumetone (RELAFEN) 500 MG tablet Take 1 tablet by mouth twice daily as needed for pain 11/16/14   [provider]  nabumetone (RELAFEN) 750 MG tablet Take by mouth. 12/06/20   [provider]  NON FORMULARY Take 2-4 capsules by mouth daily. Rosemary capsule    [provider]  NON FORMULARY Take 2-4 capsules by mouth daily. Horsetail capsule    [provider]  NON FORMULARY     [provider]  triamcinolone (NASACORT AQ) 55 MCG/ACT AERO nasal inhaler Place 2 sprays into the nose daily. 11/03/15   Anselm Pancoast, PA-C    Physical Exam:  Vitals:   04/04/22 1755 04/04/22 1800 04/04/22 1850 04/04/22 2044  BP: (!) 141/81 (!) 150/76 135/75 126/81  Pulse: 74 82 70 69  Resp: 15 17 18 18   Temp: 98.7 F (37.1 C)  99.2 F (37.3 C) 98.6 F (37 C)  TempSrc: Oral  Oral Oral  SpO2: 100% 100% 100% 100%  Weight:        Constitutional: Awake alert and oriented x3, no associated distress.   Skin: no rashes, no lesions, good skin turgor noted. Eyes: Pupils are equally reactive to light.  No evidence of scleral  icterus or conjunctival pallor.  ENMT: Moist mucous membranes noted.  Posterior pharynx clear of any exudate or lesions.   Neck: normal, supple, no masses, no thyromegaly.  No evidence of jugular venous distension.   Respiratory: clear to auscultation bilaterally, no wheezing, no crackles. Normal respiratory effort. No accessory muscle use.  Cardiovascular: Regular rate and rhythm, no murmurs / rubs / gallops. No extremity edema. 2+ pedal pulses. No carotid bruits.  Chest:   Nontender without crepitus or deformity.   Back:   Nontender without crepitus or deformity. Abdomen: Abdomen is soft and nontender.  No evidence of intra-abdominal masses.  Positive bowel sounds noted in all quadrants.   Musculoskeletal: No joint deformity upper and lower extremities. Good ROM, no contractures. Normal muscle tone.  Neurologic: 4 out of 5 strength of the proximal distal muscle groups of the right lower extremity.  CN 2-12 grossly intact. Sensation intact.  Patient moving all 4 extremities spontaneously.  Patient is following all commands.  Patient is responsive to verbal stimuli.   Psychiatric: Patient exhibits normal mood with appropriate affect.  Patient seems to possess insight as to their current situation.    Data Reviewed:  I have personally reviewed and interpreted labs, imaging.  Significant findings are:  Ethanol less than 10 Chemistry revealing sodium 141, potassium 3.5, chloride 107, bicarbonate 27, BUN 9, creatinine 0.88 Urine toxicology screen unremarkable Urinalysis unremarkable CBC revealing white blood cell count of 5.2, hemoglobin 12.6, hematocrit 38.4, platelet count of 262 Noncontrast CT imaging of the head revealing no evidence of intracranial hemorrhage.  Mild chronic small vessel ischemic changes of the cerebral hemispheric white matter. CT angiogram of the head and neck unremarkable  EKG: Personally reviewed.  Rhythm is normal sinus rhythm with heart rate of 87 bpm.  No dynamic ST  segment changes appreciated.   Assessment and Plan: * Acute stroke due to ischemia [facility]) Patient initially presenting with leg weakness as well as facial tingling, somewhat improved but persisting  Obtaining noncontrast MRI brain to confirm stroke Performing serial neurologic checks Monitoring patient on telemetry Initiating antiplatelet therapy per Dr. Sharen Hones recommendations including aspirin and Plavix daily.  Patient has aspirin listed as an allergy however upon further discussion she simply has some GI intolerance if she takes without food.  Will ensure patient takes it with food and give a PPI as well. Initiating statin therapy with atorvastatin Obtaining hemoglobin A1c and lipid panel in the morning Echocardiogram in the morning with bubble study PT, OT, SLP evaluation Permissive hypertension.  We will only provide as needed antihypertensives only to be given if blood pressure greater than 220/115 Neurology following in consultation, their assistance is appreciated.    Essential hypertension Permissive hypertension for now In the meantime, holding antihypertensives As needed intravenous antihypertensives for markedly elevated blood pressures greater than 220/115  GERD without esophagitis Protonix 40 mg daily  Mild intermittent asthma without complication No evidence of acute asthma exacerbation As needed bronchodilator therapy for shortness of breath and wheezing.        Code Status:  Full code  code status decision has been confirmed with: patient Family Communication: Sister is at the bedside who has been updated on plan of care  Consults: Dr. Selina Cooley / Derry Lory with Neurology  Severity of Illness:  The appropriate patient status for this patient is OBSERVATION. Observation status is judged to be reasonable and necessary in order to provide the required intensity of service to ensure the patient's safety. The patient's presenting symptoms, physical exam findings,  and initial radiographic and laboratory data in the context of their medical condition is felt to place them at decreased risk for further clinical deterioration. Furthermore, it  is anticipated that the patient will be medically stable for discharge from the hospital within 2 midnights of admission.   Author:  Marinda Elk MD  04/04/2022 10:11 PM

## 2022-04-04 NOTE — Assessment & Plan Note (Addendum)
   Permissive hypertension for now  In the meantime, holding antihypertensives  As needed intravenous antihypertensives for markedly elevated blood pressures greater than 220/115

## 2022-04-04 NOTE — Assessment & Plan Note (Addendum)
   Patient initially presenting with leg weakness as well as facial tingling, somewhat improved but persisting   Obtaining noncontrast MRI brain to confirm stroke  Performing serial neurologic checks  Monitoring patient on telemetry  Initiating antiplatelet therapy per Dr. Sharen Hones recommendations including aspirin and Plavix daily.  Patient has aspirin listed as an allergy however upon further discussion she simply has some GI intolerance if she takes without food.  Will ensure patient takes it with food and give a PPI as well.  Initiating statin therapy with atorvastatin  Obtaining hemoglobin A1c and lipid panel in the morning  Echocardiogram in the morning with bubble study  PT, OT, SLP evaluation  Permissive hypertension.  We will only provide as needed antihypertensives only to be given if blood pressure greater than 220/115  Neurology following in consultation, their assistance is appreciated.

## 2022-04-04 NOTE — ED Notes (Signed)
Assumed care of pt from C. Lodema Hong, RN,

## 2022-04-05 ENCOUNTER — Observation Stay (HOSPITAL_COMMUNITY): Payer: Medicare Other

## 2022-04-05 ENCOUNTER — Observation Stay (HOSPITAL_BASED_OUTPATIENT_CLINIC_OR_DEPARTMENT_OTHER): Payer: Medicare Other

## 2022-04-05 DIAGNOSIS — I6389 Other cerebral infarction: Secondary | ICD-10-CM

## 2022-04-05 DIAGNOSIS — I639 Cerebral infarction, unspecified: Secondary | ICD-10-CM | POA: Diagnosis not present

## 2022-04-05 LAB — COMPREHENSIVE METABOLIC PANEL
ALT: 19 U/L (ref 0–44)
AST: 23 U/L (ref 15–41)
Albumin: 3.4 g/dL — ABNORMAL LOW (ref 3.5–5.0)
Alkaline Phosphatase: 38 U/L (ref 38–126)
Anion gap: 8 (ref 5–15)
BUN: 8 mg/dL (ref 8–23)
CO2: 23 mmol/L (ref 22–32)
Calcium: 8.9 mg/dL (ref 8.9–10.3)
Chloride: 109 mmol/L (ref 98–111)
Creatinine, Ser: 0.91 mg/dL (ref 0.44–1.00)
GFR, Estimated: >60 mL/min (ref >60)
Glucose, Bld: 89 mg/dL (ref 70–99)
Potassium: 3.8 mmol/L (ref 3.5–5.1)
Sodium: 140 mmol/L (ref 135–145)
Total Bilirubin: 0.6 mg/dL (ref 0.3–1.2)
Total Protein: 6.3 g/dL — ABNORMAL LOW (ref 6.5–8.1)

## 2022-04-05 LAB — CBC WITH DIFFERENTIAL/PLATELET
Abs Immature Granulocytes: 0.01 10*3/uL (ref 0.00–0.07)
Basophils Absolute: 0 10*3/uL (ref 0.0–0.1)
Basophils Relative: 1 %
Eosinophils Absolute: 0.2 10*3/uL (ref 0.0–0.5)
Eosinophils Relative: 5 %
HCT: 39.6 % (ref 36.0–46.0)
Hemoglobin: 13.3 g/dL (ref 12.0–15.0)
Immature Granulocytes: 0 %
Lymphocytes Relative: 34 %
Lymphs Abs: 1.7 10*3/uL (ref 0.7–4.0)
MCH: 31.7 pg (ref 26.0–34.0)
MCHC: 33.6 g/dL (ref 30.0–36.0)
MCV: 94.5 fL (ref 80.0–100.0)
Monocytes Absolute: 0.4 10*3/uL (ref 0.1–1.0)
Monocytes Relative: 8 %
Neutro Abs: 2.7 10*3/uL (ref 1.7–7.7)
Neutrophils Relative %: 52 %
Platelets: 236 10*3/uL (ref 150–400)
RBC: 4.19 MIL/uL (ref 3.87–5.11)
RDW: 13 % (ref 11.5–15.5)
WBC: 5 10*3/uL (ref 4.0–10.5)
nRBC: 0 % (ref 0.0–0.2)

## 2022-04-05 LAB — HEMOGLOBIN A1C
Hgb A1c MFr Bld: 5.4 % (ref 4.8–5.6)
Mean Plasma Glucose: 108.28 mg/dL

## 2022-04-05 LAB — LIPID PANEL
Cholesterol: 272 mg/dL — ABNORMAL HIGH (ref 0–200)
HDL: 106 mg/dL (ref >40)
LDL Cholesterol: 155 mg/dL — ABNORMAL HIGH (ref 0–99)
Total CHOL/HDL Ratio: 2.6 RATIO
Triglycerides: 57 mg/dL (ref <150)
VLDL: 11 mg/dL (ref 0–40)

## 2022-04-05 LAB — HIV ANTIBODY (ROUTINE TESTING W REFLEX): HIV Screen 4th Generation wRfx: NONREACTIVE

## 2022-04-05 LAB — ECHOCARDIOGRAM COMPLETE BUBBLE STUDY
AR max vel: 2.51 cm2
AV Peak grad: 5 mmHg
Ao pk vel: 1.12 m/s
Area-P 1/2: 3.17 cm2
S' Lateral: 2.2 cm

## 2022-04-05 LAB — MAGNESIUM: Magnesium: 2.1 mg/dL (ref 1.7–2.4)

## 2022-04-05 MED ORDER — PANTOPRAZOLE SODIUM 40 MG PO TBEC: 40.0000 mg | DELAYED_RELEASE_TABLET | Freq: Every day | ORAL | 3 refills | Status: DC

## 2022-04-05 MED ORDER — DEXAMETHASONE 4 MG PO TABS
4.0000 mg | ORAL_TABLET | Freq: Every day | ORAL | 0 refills | Status: AC
Start: 2022-04-06 — End: 2022-04-12

## 2022-04-05 MED ORDER — LORAZEPAM 2 MG/ML IJ SOLN
1.0000 mg | Freq: Once | INTRAMUSCULAR | Status: AC
  Administered 2022-04-05: 1 mg via INTRAVENOUS
  Filled 2022-04-05: qty 1

## 2022-04-05 MED ORDER — DEXAMETHASONE 4 MG PO TABS
4.0000 mg | ORAL_TABLET | Freq: Every day | ORAL | Status: DC
Start: 2022-04-05 — End: 2022-04-05
  Administered 2022-04-05: 4 mg via ORAL
  Filled 2022-04-05: qty 1

## 2022-04-05 MED ORDER — ASPIRIN 81 MG PO TBEC: 81.0000 mg | DELAYED_RELEASE_TABLET | Freq: Every day | ORAL | 3 refills | Status: AC

## 2022-04-05 MED ORDER — CLOPIDOGREL BISULFATE 75 MG PO TABS: 75.0000 mg | ORAL_TABLET | Freq: Every day | ORAL | 0 refills | Status: AC

## 2022-04-05 MED ORDER — ATORVASTATIN CALCIUM 20 MG PO TABS: 20.0000 mg | ORAL_TABLET | Freq: Every day | ORAL | 0 refills | Status: AC

## 2022-04-05 NOTE — Progress Notes (Addendum)
Neurology Consult Note Ref phisician : Dr Nelson Chimes Reason for referral : stroke  HPI ;  71 yo woman with hx HTN, unable to receive blood products 2/2 being a Jehovah's witness, who presented with R face and RLE numbness and RLE weakness that started at 1700. On 04/03/22 Just prior to 1700 she was completely normal. She is not on anticoagulation. When she was taking a shower she noted that her RLE was weak and numb and her R face was numb too. On arrival to the ED she was able to move her leg but not lift it off the bed and the numbness was still present. Sx rapidly improved and on my tele examination her weakness had resolved and NIHSS = 1 for numbness in her RLE only. CT head NAICP personal review. TNK was not administered 2/2 mild and rapidly improving sx.She was seen initially as a telestroke by Dr. Selina Cooley and not felt to be a candidate for thrombolysis due to rapidly improving symptoms and exam is not consistent with LVO.  MRI scan of the brain was subsequently obtained after transfer here which shows no acute infarct.  CT angiogram of the brain and neck were also obtained not emergently which shows no large vessel stenosis or occlusion.  Urine drug screen was negative.  Hemoglobin A1c was 5.4.  Patient states that she has allergy to aspirin developed some itching. On exam today there is o famiy at the bedside. Patient is sitting in the chair, alert and oriented x4. She states she still has some symptoms of right leg weakness and sensation deficits on right side. Face symmetric, tongue midline, no aphasia or dysarthria. Uppers 5/5, left 4/5, right 5/5 right is decreased sensation no ataxia.    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   History of shingles     Hypertension     Joint pain 03/19/2012   Neck pain 12/25/2011   Osteoarthritis of right knee 12/26/2013   Otitis media 03/19/2012   Palpitations     PONV (postoperative nausea and vomiting)     Positive PPD, treated     Pure hypercholesterolemia     Refusal of blood transfusions as patient is Jehovah's Witness     Sinusitis 03/04/2013   Tinea pedis 03/19/2012   Tinea versicolor 03/19/2012         Past Surgical History:  Procedure Laterality Date   ABDOMINAL HYSTERECTOMY   1998    Still has cervix and both ovaries   APPENDECTOMY       BACK SURGERY       JOINT REPLACEMENT        scope bil knees   KNEE ARTHROSCOPY   1997    right knee   Lymph node resection   1999    benign   MENISCUS REPAIR   1995    left knee   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  DR CAFFREY   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    Procedure: RIGHT TOTAL KNEE ARTHROPLASTY;  Surgeon: Thera Flake., MD;  Location: [facility] OR;  Service: Orthopedics;  Laterality: Right;         Family History  Problem Relation Age of Onset   Hyperlipidemia Mother     Hypertension Mother     Diabetes Father     Tuberculosis Father     Heart disease Paternal Aunt     Cancer Paternal Uncle          colon cancer   Cancer Cousin          breast    Social History         Socioeconomic History   Marital status: Divorced      Spouse name: Not on file   Number of children: 1   Years of education: Not on file   Highest education level: Not on file  Occupational History   Occupation: TEACHER      Employer: [employer]  Tobacco Use   Smoking status: Never   Smokeless tobacco: Never  Vaping Use   Vaping Use: Never used  Substance and Sexual Activity   Alcohol use: Not Currently      Comment: rarely   Drug use:  No   Sexual activity: Not on file  Other Topics Concern   Not on file  Social History Narrative    Caffeine Use: "all the time"    Regular exercise:  Walks daily.    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    Divorced    She has one son age 57.    Lives with her mother and her dog [name]    Social Determinants of Health    Financial Resource Strain: Not on file  Food Insecurity: Not on file  Transportation Needs: Not on file  Physical Activity: Not on file  Stress: Not on file  Social Connections: Not on file         Allergies  Allergen Reactions   Aspirin Nausea And Vomiting and Rash   Codeine Other (See Comments)      Jittery   Doxycycline Nausea Only   Other Other (See Comments)      NO BLOOD OR BLOOD PRODUCTS NO BLOOD OR BLOOD PRODUCTS      O:// Current vital signs: BP 90/71 (BP Location: Left Arm)   Pulse 91   Temp 98.2 F (36.8 C)   Resp 18   Wt 67.7 kg   SpO2 100%   BMI 24.84 kg/m  Vital signs in last 24 hours: Temp:  [98.1 F (36.7 C)-99.2 F (37.3 C)] 98.2 F (36.8 C) (07/19 0804) Pulse Rate:  [38-91] 91 (07/19 0804) Resp:  [14-30] 18 (07/19 0804) BP: (90-152)/(71-86) 90/71 (07/19 0804) SpO2:  [90 %-100 %] 100 % (07/19 0804) GENERAL: Awake, alert in NAD HEENT: - Normocephalic and atraumatic, dry mm LUNGS - Clear to auscultation bilaterally with no wheezes CV - S1S2 RRR, no m/r/g, equal pulses bilaterally. ABDOMEN - Soft, nontender, nondistended with normoactive BS Ext: warm, well perfused, intact peripheral pulses, no edema NEURO:  Mental Status: AA&Ox3  Language: speech is clear.  Naming, repetition, fluency, and comprehension intact. Cranial Nerves: PERRL 2_mm/brisk. EOMI, visual fields full, no facial asymmetry, facial sensation intact, hearing intact, tongue/uvula/soft palate midline, normal sternocleidomastoid and trapezius muscle strength. No evidence of tongue atrophy or fibrillations Motor: 5/5 in bilateral  uppers, left  5/5, right 4/5 Tone: is normal and bulk is normal Sensation-decreased on right  Coordination: FTN intact bilaterally, no ataxia in BLE. Gait- deferred    Medications  Current Facility-Administered Medications:     stroke: early stages of recovery book, , Does not apply, Once, Shalhoub, Deno Lunger, MD   acetaminophen (TYLENOL) tablet 650 mg, 650 mg, Oral, Q6H PRN **OR** acetaminophen (TYLENOL) suppository 650 mg, 650 mg, Rectal, Q6H PRN, Shalhoub, Deno Lunger, MD   albuterol (PROVENTIL) (2.5 MG/3ML) 0.083% nebulizer solution 2.5 mg, 2.5 mg, Inhalation, Q4H PRN, Shalhoub, Deno Lunger, MD   aspirin EC tablet 81 mg, 81 mg, Oral, Q breakfast, Shalhoub, Deno Lunger, MD, 81 mg at 04/04/22 2239   atorvastatin (LIPITOR) tablet 20 mg, 20 mg, Oral, Daily, Shalhoub, Deno Lunger, MD   clopidogrel (PLAVIX) tablet 75 mg, 75 mg, Oral, Daily, Shalhoub, Deno Lunger, MD, 75 mg at 04/04/22 2240   enoxaparin (LOVENOX) injection 40 mg, 40 mg, Subcutaneous, Q24H, Shalhoub, Deno Lunger, MD, 40 mg at 04/04/22 2240   hydrALAZINE (APRESOLINE) injection 10 mg, 10 mg, Intravenous, Q6H PRN, Shalhoub, Deno Lunger, MD   nabumetone (RELAFEN) tablet 500 mg, 500 mg, Oral, BID PRN, Marinda Elk, MD, 500 mg at 04/05/22 0001   ondansetron (ZOFRAN) tablet 4 mg, 4 mg, Oral, Q6H PRN **OR** ondansetron (ZOFRAN) injection 4 mg, 4 mg, Intravenous, Q6H PRN, Shalhoub, Deno Lunger, MD   pantoprazole (PROTONIX) EC tablet 40 mg, 40 mg, Oral, Daily, Shalhoub, Deno Lunger, MD, 40 mg at 04/04/22 2239   polyethylene glycol (MIRALAX / GLYCOLAX) packet 17 g, 17 g, Oral, Daily PRN, Marinda Elk, MD Labs CBC    Component Value Date/Time   WBC 5.0 04/05/2022 0406   RBC 4.19 04/05/2022 0406   HGB 13.3 04/05/2022 0406   HCT 39.6 04/05/2022 0406   PLT 236 04/05/2022 0406   MCV 94.5 04/05/2022 0406   MCH 31.7 04/05/2022 0406   MCHC 33.6 04/05/2022 0406   RDW 13.0 04/05/2022 0406   LYMPHSABS 1.7 04/05/2022 0406   MONOABS 0.4 04/05/2022 0406    EOSABS 0.2 04/05/2022 0406   BASOSABS 0.0 04/05/2022 0406    CMP     Component Value Date/Time   NA 140 04/05/2022 0406   K 3.8 04/05/2022 0406   CL 109 04/05/2022 0406   CO2 23 04/05/2022 0406   GLUCOSE 89 04/05/2022 0406   BUN 8 04/05/2022 0406   CREATININE 0.91 04/05/2022 0406   CREATININE 0.97 03/19/2012 1132   CALCIUM 8.9 04/05/2022 0406   PROT 6.3 (L) 04/05/2022 0406   ALBUMIN 3.4 (L) 04/05/2022 0406   AST 23 04/05/2022 0406   ALT 19 04/05/2022 0406   ALKPHOS 38 04/05/2022 0406   BILITOT 0.6 04/05/2022 0406   GFRNONAA >60 04/05/2022 0406   GFRNONAA 64 03/19/2012 1132   GFRAA >60 02/04/2019 1946   GFRAA 74 03/19/2012 1132    glycosylated hemoglobin  Lipid Panel     Component Value Date/Time   CHOL 209 (H) 03/19/2012 1132   TRIG 70 03/19/2012 1132   HDL 74 03/19/2012 1132   CHOLHDL 2.8 03/19/2012 1132   VLDL 14 03/19/2012 1132   LDLCALC 121 (H) 03/19/2012 1132     Imaging I have reviewed images in epic and the results pertinent to this consultation are:  CT-scan of the brain 1. Mild chronic small-vessel ischemic change of the cerebral hemispheric white matter. No acute CT finding.2. ASPECTS is 10  CTA head/neck normal  MRI examination of the brain 1. No  acute intracranial abnormality. 2. Mild chronic microvascular ischemic disease for age  Echo pending HgbA1c 5.4 LDL-c 155- started atorvastatin by primary attending   Assessment:  71 year old Jehovah's Witness female with past medical history of gastroesophageal reflux disease, hypertension, hyperlipidemia, asthma who presented to med [facility] emergency department due to complaints of right leg weakness along with right face numbness and tingling.  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  Check echocardiogram results. Aggressive risk factor modification. -Can be discharged from neurology  standpoint -Outpatient follow with stroke clinic after discharge in 2 months - Neurology will sign off. Please call with questions or concerns  Gevena Mart DNP, [facility]-AG   STROKE MD NOTE : I have personally obtained history,examined this patient, reviewed notes, independently viewed imaging studies, participated in medical decision making and plan of care.ROS completed by me personally and pertinent positives fully documented  I have made any additions or clarifications directly to the above note. Agree with note above.  Patient presented with right-sided paresthesia and weakness with rapidly improving symptoms and was seen by telemetry neurology and not felt to be candidate for thrombolysis due to rapid improvement.  She states she is nearly back to normal except mild respiratory seizures and exam is otherwise nonfocal.  MRI shows no acute stroke and CT angiogram shows no large vessel stenosis or occlusion.  Echocardiogram results are awaited.  Recommend dual antiplatelet therapy of aspirin and Plavix for 3 weeks and then aspirin alone.  Aggressive risk factor modification.  Follow-up as outpatient stroke clinic in 2 months.  Stroke team will sign off.  Can call for questions.  Discussed with patient and Dr. Nelson Chimes Greater than 50% time during this 60-minute consultation visit was spent in counseling and coordination of care about TIA and discussion about stroke risk, prevention and treatment and answering questions. Delia Heady, MD Medical Director [facility] Pager: [phone] 04/05/2022 4:36 PM

## 2022-04-05 NOTE — Discharge Summary (Signed)
Physician Discharge Summary  Takelia Urieta Postlewaite NWG:[id number] DOB: [birth year] DOA: 04/03/2022  PCP: [name], NP  Admit date: 04/03/2022 Discharge date: 04/05/2022  Admitted From: Home Disposition:  Home  Recommendations for Outpatient Follow-up:  Follow up with PCP in 1-2 weeks Please obtain BMP/CBC in one week your next doctors visit.  ASA + Plavix for 3 weeks followed by ASA indefinitely  Lipitor ordered Decadron for 7 days orally  Follow up outpatient Pain management and Neurosurgery PPI daily  Follow up outpatient Neurology in 4 weeks.  Outpatient rehab.    Discharge Condition: Stable CODE STATUS: Full Code Diet recommendation: 2g Na  Brief/Interim Summary: 71 year old Jehovah's Witness comes to the hospital with complaints of GERD, HTN, HLD, asthma, history of lumbar radiculopathy status post laminectomy/fusion came to [facility] with complains of right facial numbness and tingling along with right lower extremity weakness.  Neurology recommended admitting patient for stroke work-up.  CT of the head was negative.  She was started on aspirin and Plavix.  Patient was seen by neurology team.  A1c 5.4, UDS negative.  MRI brain was also negative, LDL was 155.  Right-sided facial symptoms resolved but right lower extremity weakness which persisted therefore lumbar spine was ordered.  MRI spine showed disc bulge with slight neurocompression.  Case was discussed with Dr. Franky Macho from neurosurgery who recommended outpatient follow-up in 7 days of Decadron otherwise no acute surgical needs at this time. Later in the day had extensive discussion with patient regarding her care and her sister was present at bedside.  I explained to patient that she will need to follow-up outpatient with pain management for her lower back and right lower extremity pain and weakness, take 7 days of oral Decadron and get outpatient neurosurgery referral by her PCP. She also tells me that she tells me that she is not  allergic to sulfa tells me that she is not allergic to aspirin as long as enteric-coated therefore willing to take aspirin and Plavix for 3 weeks followed by aspirin.     Assessment & Plan:  Principal Problem:   Acute stroke due to ischemia [facility]) Active Problems:   Essential hypertension   GERD without esophagitis   Mild intermittent asthma without complication       Assessment and Plan: Right-sided facial numbness and tingling, resolved Right lower extremity weakness -CT head, MRI brain is negative for acute pathology.  CTA head and neck is also negative.  A1c 5.4, UDS negative, LDL 155.  Suspicion still remains for TIA.  A.  A.  Neurology recommending aspirin Plavix for 3 weeks followed by aspirin. -MRI lumbar spine showed bulging of the disc with nerve root compression.  Discussed with Dr Franky Macho who reviewed her MRI- no surgical indication, ok for week long decadron and outptn follow up.  She follows at [facility] pain management for monthly epidural bupivacaine/Kenalog injections. --Echocardiogram normal ef   Essential hypertension -We will resume home meds   GERD without esophagitis -PPI   Mild intermittent asthma without complication As needed bronchodilators   PT/OT-OT-recommends outpatient therapy     Body mass index is 24.84 kg/m.       Discharge Diagnoses:  Principal Problem:   Acute stroke due to ischemia [facility]) Active Problems:   Essential hypertension   GERD without esophagitis   Mild intermittent asthma without complication      Consultations: Neurology Neurosurgery-Dr. Franky Macho.  Curb sided  Subjective: Doing well no complaints no complaints.  Right lower extremity weakness and pain  weakness and pain is intermittent..  All the questions have been answered by me patient is having answered by me.  Patient's sister is at bedside.   Discharge Exam: Vitals:   04/05/22 0444 04/05/22 0804  BP: 117/71 90/71  Pulse: 68 91  Resp: 18 18  Temp: 98.6 F  (37 C) 98.2 F (36.8 C)  SpO2: 99% 100%   Vitals:   04/04/22 2044 04/05/22 0007 04/05/22 0444 04/05/22 0804  BP: 126/81 119/73 117/71 90/71  Pulse: 69 70 68 91  Resp: 18 18 18 18   Temp: 98.6 F (37 C) 98.2 F (36.8 C) 98.6 F (37 C) 98.2 F (36.8 C)  TempSrc: Oral Oral Oral   SpO2: 100% 99% 99% 100%  Weight:        General: Pt is alert, awake, not in acute distress Cardiovascular: RRR, S1/S2 +, no rubs, no gallops Respiratory: CTA bilaterally, no wheezing, no rhonchi Abdominal: Soft, NT, ND, bowel sounds + Extremities: no edema, no cyanosis  Discharge Instructions  Discharge Instructions     Ambulatory referral to Physical Therapy   Complete by: As directed       Allergies as of 04/05/2022       Reactions   Aspirin Nausea And Vomiting, Nausea Only   Patient reports mild GI intolerance only if she takes it without food.    Codeine Other (See Comments)   Jittery   Doxycycline Nausea Only   Other Other (See Comments)   NO BLOOD OR BLOOD PRODUCTS NO BLOOD OR BLOOD PRODUCTS        Medication List     STOP taking these medications    cephALEXin 500 MG capsule Commonly known as: KEFLEX   ibuprofen 800 MG tablet Commonly known as: ADVIL       TAKE these medications    albuterol 108 (90 Base) MCG/ACT inhaler Commonly known as: VENTOLIN HFA Inhale 1 puff into the lungs every 4 (four) hours as needed for shortness of breath.   aspirin EC 81 MG tablet Take 1 tablet (81 mg total) by mouth daily with breakfast. Swallow whole. Start taking on: April 06, 2022   atorvastatin 20 MG tablet Commonly known as: LIPITOR Take 1 tablet (20 mg total) by mouth daily.   BIOTIN PO Take 1 tablet by mouth daily.   Calcium-Vitamin D 600-200 MG-UNIT Caps Take 4 capsules by mouth daily.   CINNAMON PO Take 2 tablets by mouth daily.   clopidogrel 75 MG tablet Commonly known as: PLAVIX Take 1 tablet (75 mg total) by mouth daily for 21 days. Start taking on: April 06, 2022   co-enzyme Q-10 30 MG capsule Take 30 mg by mouth daily.   cyclobenzaprine 10 MG tablet Commonly known as: FLEXERIL Take 10 mg by mouth 3 (three) times daily as needed for muscle spasms.   dexamethasone 4 MG tablet Commonly known as: DECADRON Take 1 tablet (4 mg total) by mouth daily for 6 days. Start taking on: April 06, 2022   ECK FOOD ENZYME PO Take 1 tablet by mouth daily.   erythromycin ophthalmic ointment Place 1 Application into both eyes at bedtime.   fexofenadine 180 MG tablet Commonly known as: ALLEGRA Take 180 mg by mouth daily.   fluticasone 50 MCG/ACT nasal spray Commonly known as: FLONASE Place 2 sprays into the nose daily as needed. For allergies   gabapentin 100 MG capsule Commonly known as: NEURONTIN Take 200 mg by mouth at bedtime.   guaiFENesin 600 MG 12 hr tablet  Commonly known as: Mucinex Take 1 tablet (600 mg total) by mouth 2 (two) times daily. What changed:  when to take this reasons to take this   ketoconazole 2 % cream Commonly known as: NIZORAL Apply 1 Application topically daily as needed for irritation.   L-LYSINE PO Take 1 tablet by mouth daily.   linaclotide 290 MCG Caps capsule Commonly known as: LINZESS Take 290 mcg by mouth daily before breakfast.   multivitamin with minerals tablet Take 1 tablet by mouth daily.   nabumetone 750 MG tablet Commonly known as: RELAFEN Take 750 mg by mouth daily.   NON FORMULARY Take 2 capsules by mouth daily. Rosemary capsule   NON FORMULARY Take 2 capsules by mouth daily. Horsetail capsule   pantoprazole 40 MG tablet Commonly known as: PROTONIX Take 1 tablet (40 mg total) by mouth daily. Start taking on: April 06, 2022   Red Yeast Rice 600 MG Tabs Take 600 mg by mouth daily.   Restasis 0.05 % ophthalmic emulsion Generic drug: cycloSPORINE Place 1 drop into both eyes 2 (two) times daily.   Systane Complete 0.6 % Soln Generic drug: Propylene Glycol Place 1 drop into both  eyes daily as needed (dry eyes).   vitamin B-12 1000 MCG tablet Commonly known as: CYANOCOBALAMIN Take 1,000 mcg by mouth daily.   Vitamin B-Complex Tabs Take 1 tablet by mouth daily.   vitamin C 1000 MG tablet Take 1,000 mg by mouth daily.               Durable Medical Equipment  (From admission, onward)           Start     Ordered   04/05/22 1422  For home use only DME 4 wheeled rolling walker with seat  Once       Question:  Patient needs a walker to treat with the following condition  Answer:  Weakness   04/05/22 1422            Follow-up Information     [facility] Follow up.   Specialty: Rehabilitation Why: The outpatient therapy will contact you for the first home visit Contact information: [address]  [address] [id number] mc [address] [place] [address] [phone]        [name], NP Follow up in 1 week(s).   Specialty: Nurse Practitioner Contact information: [address] [place] [address] [phone]         [name], NP Follow up in 1 month(s).   Specialty: Nurse Practitioner Contact information: [address] [place] [address] [phone]                Allergies  Allergen Reactions   Aspirin Nausea And Vomiting and Nausea Only    Patient reports mild GI intolerance only if she takes it without food.    Codeine Other (See Comments)    Jittery   Doxycycline Nausea Only   Other Other (See Comments)    NO BLOOD OR BLOOD PRODUCTS NO BLOOD OR BLOOD PRODUCTS    You were cared for by a hospitalist during your hospital stay. If you have any questions about your discharge medications or the care you received while you were in the hospital after you are discharged, you can call the unit and asked to speak with the hospitalist on call if the hospitalist that took care of you is not available. Once you are discharged, your primary care  physician will handle any further medical issues. Please note that no refills for any discharge medications will be authorized once you are discharged, as it is imperative that you return to your primary care physician (or establish a relationship with a primary care physician if you do not have one) for your aftercare needs so that they can reassess your need for medications and monitor your lab values.   Procedures/Studies: ECHOCARDIOGRAM COMPLETE BUBBLE STUDY  Result Date: 04/05/2022    ECHOCARDIOGRAM REPORT   Patient Name:   BRYTNEY SOMES [facility] Date of Exam: 04/05/2022 Medical Rec #:  [id number]        Height:       65.0 in Accession #:    [id number]       Weight:       149.3 lb Date of Birth:  11-12-1950         BSA:          1.747 m Patient Age:    71 years         BP:           90/71 mmHg Patient Gender: F                HR:           76 bpm. Exam Location:  Inpatient Procedure: 2D Echo, Cardiac Doppler and Color Doppler Indications:    TIA  History:        Patient has prior history of Echocardiogram examinations, most                 recent 06/11/2019. Risk Factors:Hypertension.  Sonographer:    Eduard Roux Referring Phys: [id number] Marinda Elk  Sonographer Comments: Unable to perform bubble study due to no IV access. IMPRESSIONS  1. Left ventricular ejection fraction, by estimation, is 60 to 65%. The left ventricle has normal function. The left ventricle has no regional wall motion abnormalities. Left ventricular diastolic parameters are consistent with Grade I diastolic dysfunction (impaired relaxation).  2. Right ventricular systolic function is normal. The right ventricular size is normal. There is normal pulmonary artery systolic pressure. The estimated right ventricular systolic pressure is 20.6 mmHg.  3. The mitral valve is normal in structure. Trivial mitral valve regurgitation. No evidence of mitral stenosis.  4. The aortic valve is tricuspid. There is mild thickening of the aortic valve.  Aortic valve regurgitation is trivial. Aortic valve sclerosis is present, with no evidence of aortic valve stenosis.  5. The inferior vena cava is normal in size with greater than 50% respiratory variability, suggesting right atrial pressure of 3 mmHg.  6. Agitated saline contrast bubble study was negative, with no evidence of any interatrial shunt. Conclusion(s)/Recommendation(s): No intracardiac source of embolism detected on this transthoracic study. Consider a transesophageal echocardiogram to exclude cardiac source of embolism if clinically indicated. FINDINGS  Left Ventricle: Left ventricular ejection fraction, by estimation, is 60 to 65%. The left ventricle has normal function. The left ventricle has no regional wall motion abnormalities. The left ventricular internal cavity size was normal in size. There is  no left ventricular hypertrophy. Left ventricular diastolic parameters are consistent with Grade I diastolic dysfunction (impaired relaxation). Right Ventricle: The right ventricular size is normal. No increase in right ventricular wall thickness. Right ventricular systolic function is normal. There is normal pulmonary artery systolic pressure. The tricuspid regurgitant velocity is 2.10 m/s, and  with an assumed right atrial pressure of 3 mmHg, the estimated right ventricular systolic pressure is  20.6 mmHg. Left Atrium: Left atrial size was normal in size. Right Atrium: Right atrial size was normal in size. Pericardium: Trivial pericardial effusion is present. The pericardial effusion is posterior to the left ventricle. Mitral Valve: The mitral valve is normal in structure. There is mild thickening of the mitral valve leaflet(s). Trivial mitral valve regurgitation. No evidence of mitral valve stenosis. Tricuspid Valve: The tricuspid valve is normal in structure. Tricuspid valve regurgitation is trivial. Aortic Valve: The aortic valve is tricuspid. There is mild thickening of the aortic valve. Aortic  valve regurgitation is trivial. Aortic valve sclerosis is present, with no evidence of aortic valve stenosis. Aortic valve peak gradient measures 5.0 mmHg. Pulmonic Valve: The pulmonic valve was normal in structure. Pulmonic valve regurgitation is trivial. Aorta: The aortic root and ascending aorta are structurally normal, with no evidence of dilitation. Venous: The inferior vena cava is normal in size with greater than 50% respiratory variability, suggesting right atrial pressure of 3 mmHg. IAS/Shunts: The interatrial septum is aneurysmal. The atrial septum is grossly normal. Agitated saline contrast was given intravenously to evaluate for intracardiac shunting. Agitated saline contrast bubble study was negative, with no evidence of any interatrial shunt.  LEFT VENTRICLE PLAX 2D LVIDd:         4.10 cm   Diastology LVIDs:         2.20 cm   LV e' medial:    5.82 cm/s LV PW:         0.90 cm   LV E/e' medial:  9.3 LV IVS:        0.80 cm   LV e' lateral:   7.89 cm/s LVOT diam:     1.90 cm   LV E/e' lateral: 6.8 LV SV:         57 LV SV Index:   33 LVOT Area:     2.84 cm  RIGHT VENTRICLE             IVC RV Basal diam:  2.90 cm     IVC diam: 1.20 cm RV S prime:     12.80 cm/s TAPSE (M-mode): 2.0 cm LEFT ATRIUM             Index        RIGHT ATRIUM           Index LA diam:        3.00 cm 1.72 cm/m   RA Area:     10.60 cm LA Vol (A2C):   26.9 ml 15.40 ml/m  RA Volume:   24.10 ml  13.80 ml/m LA Vol (A4C):   19.0 ml 10.88 ml/m LA Biplane Vol: 22.9 ml 13.11 ml/m  AORTIC VALVE                 PULMONIC VALVE AV Area (Vmax): 2.51 cm     PV Vmax:       0.77 m/s AV Vmax:        111.50 cm/s  PV Peak grad:  2.4 mmHg AV Peak Grad:   5.0 mmHg LVOT Vmax:      98.60 cm/s LVOT Vmean:     56.200 cm/s LVOT VTI:       0.201 m  AORTA Ao Root diam: 2.80 cm Ao Asc diam:  2.70 cm MITRAL VALVE               TRICUSPID VALVE MV Area (PHT): 3.17 cm    TR Peak grad:   17.6 mmHg MV Decel Time:  239 msec    TR Vmax:        210.00 cm/s MV E  velocity: 54.00 cm/s MV A velocity: 79.70 cm/s  SHUNTS MV E/A ratio:  0.68        Systemic VTI:  0.20 m                            Systemic Diam: 1.90 cm Laurance Flatten MD Electronically signed by Laurance Flatten MD Signature Date/Time: 04/05/2022/4:36:21 PM    Final    MR LUMBAR SPINE WO CONTRAST  Result Date: 04/05/2022 CLINICAL DATA:  Right leg weakness and back pain. Remote history of lumbar fusion at L4-5. EXAM: MRI LUMBAR SPINE WITHOUT CONTRAST TECHNIQUE: Multiplanar, multisequence MR imaging of the lumbar spine was performed. No intravenous contrast was administered. COMPARISON:  Remote MR examination from 04/21/2013. Radiographs 02/23/2018 FINDINGS: Segmentation: There are five lumbar type vertebral bodies. The last full intervertebral disc space is labeled L5-S1. This correlates with the prior MRI and plain films. Alignment:  Normal Vertebrae: Normal marrow signal. No bone lesions or fractures. Mild artifact associated with the L4-5 hardware. Conus medullaris and cauda equina: Conus extends to the L1-2 level. Conus and cauda equina appear normal. Paraspinal and other soft tissues: 5 cm upper pole left renal cyst not covered on the prior MRI. There is also a 12 mm lesion in the right hepatic lobe which is indeterminate but is likely a benign cyst. Recommend abdominal ultrasound examination to confirm that these are benign cysts. No aortic aneurysm or retroperitoneal adenopathy. No paraspinal abnormality. Disc levels: T12-L1: No significant findings. L1-2: Mild and slightly progressive degenerative disc disease with a bulging annulus and mild osteophytic ridging. There is mild flattening of the ventral thecal sac but no significant spinal or foraminal stenosis. L2-3: Diffuse annular bulge, osteophytic ridging and mild facet disease contributing to mild bilateral lateral recess stenosis. No significant foraminal stenosis. L3-4: Diffuse bulging annulus, moderate facet disease and ligamentum flavum  thickening contributing to mild right and moderate to moderately severe left lateral recess stenosis. There is also a diffuse lateral foraminal, extraforaminal and far lateral disc protrusion on the left which is slightly displacing the left L3 nerve root. L4-5: Posterior and interbody fusion changes with wide decompressive laminectomy. No spinal or foraminal stenosis. L5-S1: Mild annular bulge and right greater than left facet disease and ligamentum flavum thickening contributing to mild bilateral lateral recess stenosis. There is also a shallow broad-based right foraminal disc protrusion which comes close to contacting and could irritate the right L5 nerve root. IMPRESSION: 1. 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 2. Findings at L3-4 of left L3 and L4 nerve root compression as detailed above. 3. Mild bilateral lateral recess stenosis at L5-S1. There is also a shallow broad-based right foraminal disc protrusion which comes close to contacting and could irritate the right L5 nerve root. 4. Posterior and interbody fusion changes at L4-5 without complicating features. No spinal or foraminal stenosis. Electronically Signed   By: Rudie Meyer M.D.   On: 04/05/2022 13:48   MR BRAIN WO CONTRAST  Result Date: 04/05/2022 CLINICAL DATA:  Initial evaluation for neuro deficit, stroke suspected. EXAM: MRI HEAD WITHOUT CONTRAST TECHNIQUE: Multiplanar, multiecho pulse sequences of the brain and surrounding structures were obtained without intravenous contrast. COMPARISON:  Prior CTs from 04/03/2022. FINDINGS: Brain: Cerebral volume within normal limits. Mild patchy T2/FLAIR hyperintensity involving the periventricular and deep white matter both cerebral hemispheres, most consistent with  chronic small vessel ischemic disease, mild in nature. No evidence for acute or subacute ischemia. Gray-white matter differentiation maintained. No encephalomalacia to suggest chronic cortical infarction. No acute intracranial  hemorrhage. No mass lesion, midline shift or mass effect. No hydrocephalus or extra-axial fluid collection. Pituitary gland and suprasellar region within normal limits. Vascular: Major intracranial vascular flow voids are maintained. Skull and upper cervical spine: Craniocervical junction within normal limits. Bone marrow signal intensity normal. No scalp soft tissue abnormality. Sinuses/Orbits: Globes orbital soft tissues within normal limits. Paranasal sinuses are clear. No mastoid effusion. Other: None. IMPRESSION: 1. No acute intracranial abnormality. 2. Mild chronic microvascular ischemic disease for age. Electronically Signed   By: Rise Mu M.D.   On: 04/05/2022 05:56   CT ANGIO HEAD NECK W WO CM  Result Date: 04/03/2022 CLINICAL DATA:  Stroke, follow up. 71 y/o female here for numbness in her right leg and face around lips. EXAM: CT ANGIOGRAPHY HEAD AND NECK TECHNIQUE: Multidetector CT imaging of the head and neck was performed using the standard protocol during bolus administration of intravenous contrast. Multiplanar CT image reconstructions and MIPs were obtained to evaluate the vascular anatomy. Carotid stenosis measurements (when applicable) are obtained utilizing NASCET criteria, using the distal internal carotid diameter as the denominator. RADIATION DOSE REDUCTION: This exam was performed according to the departmental dose-optimization program which includes automated exposure control, adjustment of the mA and/or kV according to patient size and/or use of iterative reconstruction technique. CONTRAST:  75mL OMNIPAQUE IOHEXOL 350 MG/ML SOLN COMPARISON:  None Available. FINDINGS: CT HEAD FINDINGS Brain: There is no mass, hemorrhage or extra-axial collection. The size and configuration of the ventricles and extra-axial CSF spaces are normal. There is no acute or chronic infarction. The brain parenchyma is normal. Skull: The visualized skull base, calvarium and extracranial soft tissues are  normal. Sinuses/Orbits: No fluid levels or advanced mucosal thickening of the visualized paranasal sinuses. No mastoid or middle ear effusion. The orbits are normal. CTA NECK FINDINGS SKELETON: There is no bony spinal canal stenosis. No lytic or blastic lesion. OTHER NECK: Normal pharynx, larynx and major salivary glands. No cervical lymphadenopathy. Unremarkable thyroid gland. UPPER CHEST: No pneumothorax or pleural effusion. No nodules or masses. AORTIC ARCH: There is no calcific atherosclerosis of the aortic arch. There is no aneurysm, dissection or hemodynamically significant stenosis of the visualized portion of the aorta. Conventional 3 vessel aortic branching pattern. The visualized proximal subclavian arteries are widely patent. RIGHT CAROTID SYSTEM: Normal without aneurysm, dissection or stenosis. LEFT CAROTID SYSTEM: Normal without aneurysm, dissection or stenosis. VERTEBRAL ARTERIES: Left dominant configuration. Both origins are clearly patent. There is no dissection, occlusion or flow-limiting stenosis to the skull base (V1-V3 segments). CTA HEAD FINDINGS POSTERIOR CIRCULATION: --Vertebral arteries: Normal V4 segments. --Inferior cerebellar arteries: Normal. --Basilar artery: Normal. --Superior cerebellar arteries: Normal. --Posterior cerebral arteries (PCA): Normal. ANTERIOR CIRCULATION: --Intracranial internal carotid arteries: Normal. --Anterior cerebral arteries (ACA): Normal. Both A1 segments are present. Patent anterior communicating artery (a-comm). --Middle cerebral arteries (MCA): Normal. VENOUS SINUSES: As permitted by contrast timing, patent. ANATOMIC VARIANTS: None Review of the MIP images confirms the above findings. IMPRESSION: Normal CTA of the head and neck. Electronically Signed   By: Deatra Robinson M.D.   On: 04/03/2022 20:25   CT HEAD CODE STROKE WO CONTRAST  Result Date: 04/03/2022 CLINICAL DATA:  Code stroke. Neuro deficit, acute, stroke suspected. EXAM: CT HEAD WITHOUT CONTRAST  TECHNIQUE: Contiguous axial images were obtained from the base of the skull through the  vertex without intravenous contrast. RADIATION DOSE REDUCTION: This exam was performed according to the departmental dose-optimization program which includes automated exposure control, adjustment of the mA and/or kV according to patient size and/or use of iterative reconstruction technique. COMPARISON:  05/23/2021 FINDINGS: Brain: No acute CT finding. There are mild chronic small-vessel ischemic changes of the white matter. No mass lesion, hemorrhage, hydrocephalus or extra-axial collection. Vascular: No abnormal vascular finding. Skull: Negative Sinuses/Orbits: Clear/normal Other: None ASPECTS ([place] Stroke Program Early CT Score) - Ganglionic level infarction (caudate, lentiform nuclei, internal capsule, insula, M1-M3 cortex): 7 - Supraganglionic infarction (M4-M6 cortex): 3 Total score (0-10 with 10 being normal): 10 IMPRESSION: 1. Mild chronic small-vessel ischemic change of the cerebral hemispheric white matter. No acute CT finding. 2. ASPECTS is 10 3. These results were called by telephone at the time of interpretation on 04/03/2022 at 6:49 pm to provider EDP , who verbally acknowledged these results. Electronically Signed   By: Paulina Fusi M.D.   On: 04/03/2022 18:51     The results of significant diagnostics from this hospitalization (including imaging, microbiology, ancillary and laboratory) are listed below for reference.     Microbiology: No results found for this or any previous visit (from the past 240 hour(s)).   Labs: BNP (last 3 results) No results for input(s): "BNP" in the last 8760 hours. Basic Metabolic Panel: Recent Labs  Lab 04/03/22 1836 04/05/22 0406  NA 141 140  K 3.5 3.8  CL 107 109  CO2 27 23  GLUCOSE 126* 89  BUN 9 8  CREATININE 0.88 0.91  CALCIUM 9.2 8.9  MG  --  2.1   Liver Function Tests: Recent Labs  Lab 04/03/22 1836 04/05/22 0406  AST 30 23  ALT 23 19  ALKPHOS  52 38  BILITOT 0.3 0.6  PROT 7.1 6.3*  ALBUMIN 3.9 3.4*   No results for input(s): "LIPASE", "AMYLASE" in the last 168 hours. No results for input(s): "AMMONIA" in the last 168 hours. CBC: Recent Labs  Lab 04/03/22 1836 04/05/22 0406  WBC 5.2 5.0  NEUTROABS 2.8 2.7  HGB 12.6 13.3  HCT 38.4 39.6  MCV 96.0 94.5  PLT 262 236   Cardiac Enzymes: No results for input(s): "CKTOTAL", "CKMB", "CKMBINDEX", "TROPONINI" in the last 168 hours. BNP: Invalid input(s): "POCBNP" CBG: Recent Labs  Lab 04/03/22 1839  GLUCAP 117*   D-Dimer No results for input(s): "DDIMER" in the last 72 hours. Hgb A1c Recent Labs    04/05/22 0406  HGBA1C 5.4   Lipid Profile Recent Labs    04/05/22 0406  CHOL 272*  HDL 106  LDLCALC 155*  TRIG 57  CHOLHDL 2.6   Thyroid function studies No results for input(s): "TSH", "T4TOTAL", "T3FREE", "THYROIDAB" in the last 72 hours.  Invalid input(s): "FREET3" Anemia work up No results for input(s): "VITAMINB12", "FOLATE", "FERRITIN", "TIBC", "IRON", "RETICCTPCT" in the last 72 hours. Urinalysis    Component Value Date/Time   COLORURINE YELLOW 04/03/2022 1836   APPEARANCEUR CLEAR 04/03/2022 1836   LABSPEC 1.010 04/03/2022 1836   PHURINE 6.0 04/03/2022 1836   GLUCOSEU NEGATIVE 04/03/2022 1836   HGBUR NEGATIVE 04/03/2022 1836   BILIRUBINUR NEGATIVE 04/03/2022 1836   KETONESUR NEGATIVE 04/03/2022 1836   PROTEINUR NEGATIVE 04/03/2022 1836   UROBILINOGEN 0.2 12/19/2013 1142   NITRITE NEGATIVE 04/03/2022 1836   LEUKOCYTESUR SMALL (A) 04/03/2022 1836   Sepsis Labs Recent Labs  Lab 04/03/22 1836 04/05/22 0406  WBC 5.2 5.0   Microbiology No results found for this  or any previous visit (from the past 240 hour(s)).   Time coordinating discharge:  I have spent 35 minutes face to face with the patient and on the ward discussing the patients care, assessment, plan and disposition with other care givers. >50% of the time was devoted counseling the  patient about the risks and benefits of treatment/Discharge disposition and coordinating care.   SIGNED:   Dimple Nanas, MD  [facility] 04/05/2022, 4:42 PM   If 7PM-7AM, please contact night-coverage

## 2022-04-05 NOTE — Evaluation (Signed)
Physical Therapy Evaluation Patient Details Name: April Hunt MRN: [id number] DOB: 1951/07/28 Today's Date: 04/05/2022  History of Present Illness  71 y.o. female admitted with RLE weakness and facial paresthesia, suspected acute stroke due to ischemia. PMHx:  hypertension and hyperlipidemia  Clinical Impression  Pt admitted with above diagnosis. Ambulates with RW for safety at a supervision level 100 feet today, no buckling noted, no overt LOB, although abnormal gait pattern noted not consistent with hemiparesis. Takes care of her mother, very motivated to return to PLOF. Constitutes back pain (chronic but worse lately.) Has a sister that she states can driver her to OPPT which she reports good success with in the past, at a clinic in [facility] she hopes to attend again.  Pt currently with functional limitations due to the deficits listed below (see PT Problem List). Pt will benefit from skilled PT to increase their independence and safety with mobility to allow discharge to the venue listed below.          Recommendations for follow up therapy are one component of a multi-disciplinary discharge planning process, led by the attending physician.  Recommendations may be updated based on patient status, additional functional criteria and insurance authorization.  Follow Up Recommendations Outpatient PT      Assistance Recommended at Discharge PRN  Patient can return home with the following  Assist for transportation    Equipment Recommendations Rolling walker (2 wheels)  Recommendations for Other Services       Functional Status Assessment Patient has had a recent decline in their functional status and demonstrates the ability to make significant improvements in function in a reasonable and predictable amount of time.     Precautions / Restrictions Precautions Precautions: Fall Restrictions Weight Bearing Restrictions: No      Mobility  Bed Mobility Overal bed mobility:  Modified Independent             General bed mobility comments: extra time    Transfers Overall transfer level: Needs assistance Equipment used: Rolling walker (2 wheels), None Transfers: Sit to/from Stand Sit to Stand: Supervision           General transfer comment: No assist required, supervision for safety. Cues for AD use for safety.    Ambulation/Gait Ambulation/Gait assistance: Supervision Gait Distance (Feet): 100 Feet Assistive device: Rolling walker (2 wheels) Gait Pattern/deviations: Step-to pattern, Step-through pattern, Decreased dorsiflexion - right, Decreased dorsiflexion - left Gait velocity: decreased Gait velocity interpretation: <1.8 ft/sec, indicate of risk for recurrent falls Pre-gait activities: standing march, weight shift General Gait Details: Abnormal gait pattern not consistent with hemiparesis or ataxia. Initially step to pattern leading with RLE, not dorsiflexing either foot. When cued to normalize gait pattern and symmetry showed improvements with step-through pattern and able to dorsiflex bil although less so on the right side. Pt with back pain standing upright.  Stairs            Wheelchair Mobility    Modified Rankin (Stroke Patients Only) Modified Rankin (Stroke Patients Only) Pre-Morbid Rankin Score: No symptoms Modified Rankin: Moderate disability     Balance Overall balance assessment: Needs assistance Sitting-balance support: No upper extremity supported, Feet supported Sitting balance-Leahy Scale: Normal     Standing balance support: No upper extremity supported, During functional activity Standing balance-Leahy Scale: Good                   Standardized Balance Assessment Standardized Balance Assessment : Programmer, systems Test [facility] Balance Test Sit  to Stand: Able to stand without using hands and stabilize independently Standing Unsupported: Able to stand safely 2 minutes Sitting with Back Unsupported but Feet  Supported on Floor or Stool: Able to sit safely and securely 2 minutes Stand to Sit: Sits safely with minimal use of hands Transfers: Able to transfer safely, minor use of hands Standing Unsupported with Eyes Closed: Able to stand 10 seconds safely Standing Ubsupported with Feet Together: Able to place feet together independently and stand for 1 minute with supervision From Standing, Reach Forward with Outstretched Arm: Can reach forward >12 cm safely (5") From Standing Position, Pick up Object from Floor: Unable to pick up and needs supervision From Standing Position, Turn to Look Behind Over each Shoulder: Looks behind one side only/other side shows less weight shift Turn 360 Degrees: Needs close supervision or verbal cueing Standing Unsupported, Alternately Place Feet on Step/Stool: Able to complete >2 steps/needs minimal assist Standing Unsupported, One Foot in Front: Needs help to step but can hold 15 seconds Standing on One Leg: Tries to lift leg/unable to hold 3 seconds but remains standing independently Total Score: 38         Pertinent Vitals/Pain Pain Assessment Pain Assessment: 0-10 Pain Score: 7  Pain Location: back and RLE Pain Descriptors / Indicators: Aching, Throbbing Pain Intervention(s): Monitored during session, Repositioned    Home Living Family/patient expects to be discharged to:: Private residence Living Arrangements: Parent (Takes care of her mother) Available Help at Discharge: Family;Available PRN/intermittently (sister lives near) Type of Home: House Home Access: Level entry     Alternate Level Stairs-Number of Steps: 7 (x2) Home Layout: Multi-level;Full bath on main level;Able to live on main level with bedroom/bathroom (Has to take care of mother who is upstairs)        Prior Function Prior Level of Function : History of Falls (last six months);Independent/Modified Independent;Driving             Mobility Comments: approx 3 falls in the past 6  months - reports clumsy. ADLs Comments: Ind, takes care of her mother     Hand Dominance   Dominant Hand: Right    Extremity/Trunk Assessment   Upper Extremity Assessment Upper Extremity Assessment: Defer to OT evaluation    Lower Extremity Assessment Lower Extremity Assessment: RLE deficits/detail RLE Deficits / Details: Rt hip flexion 2/5, Rt knee extension 4/5, Rt knee flexion 3-/5, Rt ankle DF 3-/5, Rt hallux extension 4/5, Rt hip abduction 3/5 (low effort.) RLE Sensation: decreased light touch (reduced sensation but present with light touch RLE, notably Rt lateral foot, and gastroc) RLE Coordination: WNL       Communication   Communication: No difficulties  Cognition Arousal/Alertness: Awake/alert Behavior During Therapy: WFL for tasks assessed/performed Overall Cognitive Status: Within Functional Limits for tasks assessed                                          General Comments      Exercises     Assessment/Plan    PT Assessment Patient needs continued PT services  PT Problem List Decreased strength;Decreased activity tolerance;Decreased balance;Decreased mobility;Impaired sensation;Pain       PT Treatment Interventions DME instruction;Gait training;Functional mobility training;Stair training;Therapeutic activities;Therapeutic exercise;Balance training;Neuromuscular re-education;Patient/family education    PT Goals (Current goals can be found in the Care Plan section)  Acute Rehab PT Goals Patient Stated Goal: Get well so  she can care for her mother PT Goal Formulation: With patient Time For Goal Achievement: 04/12/22 Potential to Achieve Goals: Good    Frequency Min 4X/week     Co-evaluation               AM-PAC PT "6 Clicks" Mobility  Outcome Measure Help needed turning from your back to your side while in a flat bed without using bedrails?: None Help needed moving from lying on your back to sitting on the side of a flat bed  without using bedrails?: None Help needed moving to and from a bed to a chair (including a wheelchair)?: None Help needed standing up from a chair using your arms (e.g., wheelchair or bedside chair)?: A Little Help needed to walk in hospital room?: A Little Help needed climbing 3-5 steps with a railing? : A Little 6 Click Score: 21    End of Session Equipment Utilized During Treatment: Gait belt Activity Tolerance: Patient tolerated treatment well Patient left: in chair;with call bell/phone within reach   PT Visit Diagnosis: Unsteadiness on feet (R26.81);Other abnormalities of gait and mobility (R26.89);History of falling (Z91.81)    Time: 2426-8341 PT Time Calculation (min) (ACUTE ONLY): 29 min   Charges:   PT Evaluation $PT Eval Low Complexity: 1 Low PT Treatments $Gait Training: 8-22 mins        Kathlyn Sacramento, PT   [place] 04/05/2022, 10:46 AM

## 2022-04-05 NOTE — Progress Notes (Addendum)
1 PIV removed without complication. Site is clean dry and intact.   Rollator to be delivered at her home tomorrow. Patient is aware.

## 2022-04-05 NOTE — TOC Transition Note (Signed)
Transition of Care [facility]) - CM/SW Discharge Note   Patient Details  Name: April Hunt MRN: [id number] Date of Birth: August 23, 1951  Transition of Care [facility]) CM/SW Contact:  Kermit Balo, RN Phone Number: 04/05/2022, 3:42 PM   Clinical Narrative:    Patient is discharging home with outpatient therapy through [facility] Information on the AVS. Pt requesting rollator or home. CM consulted with PT and they agreed she would be safe with this DME choice. [employer] to deliver the Rollator to the room.  Pt asking for information on Meals on Wheels. CM has provided her with phone numbers for these services.  Pt states her family can assist with transportation outside the hospital and can provide transport at d/c.  TOC following.   Final next level of care: OP Rehab Barriers to Discharge: No Barriers Identified   Patient Goals and CMS Choice     Choice offered to / list presented to : Patient  Discharge Placement                       Discharge Plan and Services                DME Arranged: Walker rolling with seat DME Agency: [employer] Date DME Agency Contacted: 04/05/22   Representative spoke with at DME Agency: [place]            Social Determinants of Health (SDOH) Interventions     Readmission Risk Interventions     No data to display

## 2022-04-05 NOTE — Evaluation (Signed)
Occupational Therapy Evaluation Patient Details Name: April Hunt MRN: [id number] DOB: [birth year] Today's Date: 04/05/2022   History of Present Illness 71 y.o. female admitted with RLE weakness and facial paresthesia, suspected acute stroke due to ischemia. PMHx:  hypertension and hyperlipidemia   Clinical Impression   Pt admitted for concerns listed above. PTA pt reported that she was independent with all ADL's and IADL's, including care-giving for her mother. At this time, pt presenting with balance deficits, weakness, and decreased activity tolerance. She is requiring min guard for safety. OT provided compensatory strategies for pt's safety once home. Recommending OP OT to maximize independence. OT will follow acutely.       Recommendations for follow up therapy are one component of a multi-disciplinary discharge planning process, led by the attending physician.  Recommendations may be updated based on patient status, additional functional criteria and insurance authorization.   Follow Up Recommendations  Outpatient OT    Assistance Recommended at Discharge PRN  Patient can return home with the following A little help with walking and/or transfers;A little help with bathing/dressing/bathroom;Assistance with cooking/housework    Functional Status Assessment  Patient has had a recent decline in their functional status and demonstrates the ability to make significant improvements in function in a reasonable and predictable amount of time.  Equipment Recommendations  Other (comment) (rollator)    Recommendations for Other Services       Precautions / Restrictions Precautions Precautions: Fall Restrictions Weight Bearing Restrictions: No      Mobility Bed Mobility Overal bed mobility: Modified Independent             General bed mobility comments: extra time    Transfers Overall transfer level: Needs assistance Equipment used: Rolling walker (2 wheels),  None Transfers: Sit to/from Stand Sit to Stand: Min guard           General transfer comment: Min guard for safety due to balance deficits      Balance Overall balance assessment: Needs assistance Sitting-balance support: No upper extremity supported, Feet supported Sitting balance-Leahy Scale: Normal     Standing balance support: No upper extremity supported, During functional activity Standing balance-Leahy Scale: Good                             ADL either performed or assessed with clinical judgement   ADL Overall ADL's : Modified independent                                       General ADL Comments: Pt is able to complete all ADL's with increased time     Vision Baseline Vision/History: 0 No visual deficits Ability to See in Adequate Light: 0 Adequate Patient Visual Report: No change from baseline Vision Assessment?: No apparent visual deficits     Perception     Praxis      Pertinent Vitals/Pain Pain Assessment Pain Assessment: No/denies pain     Hand Dominance Right   Extremity/Trunk Assessment Upper Extremity Assessment Upper Extremity Assessment: Generalized weakness   Lower Extremity Assessment Lower Extremity Assessment: Defer to PT evaluation   Cervical / Trunk Assessment Cervical / Trunk Assessment: Normal   Communication Communication Communication: No difficulties   Cognition Arousal/Alertness: Awake/alert Behavior During Therapy: WFL for tasks assessed/performed Overall Cognitive Status: Within Functional Limits for tasks assessed  General Comments  VSS on RA    Exercises     Shoulder Instructions      Home Living Family/patient expects to be discharged to:: Private residence Living Arrangements: Parent (Takes care of her mother) Available Help at Discharge: Family;Available PRN/intermittently (sister lives near) Type of Home: House Home  Access: Level entry     Home Layout: Multi-level;Full bath on main level;Able to live on main level with bedroom/bathroom (Has to take care of mother who is upstairs) Alternate Level Stairs-Number of Steps: 7 (x2) Alternate Level Stairs-Rails: Left;Right (Left on first half, Right on second half) Bathroom Shower/Tub: Walk-in shower;Tub/shower unit   Bathroom Toilet: Standard     Home Equipment: BSC/3in1;Shower seat          Prior Functioning/Environment Prior Level of Function : History of Falls (last six months);Independent/Modified Independent;Driving             Mobility Comments: approx 3 falls in the past 6 months - reports clumsy. ADLs Comments: Ind, takes care of her mother        OT Problem List: Decreased strength;Decreased activity tolerance;Impaired balance (sitting and/or standing);Decreased safety awareness      OT Treatment/Interventions:      OT Goals(Current goals can be found in the care plan section) Acute Rehab OT Goals Patient Stated Goal: To go home OT Goal Formulation: With patient Time For Goal Achievement: 04/05/22 Potential to Achieve Goals: Good  OT Frequency:      Co-evaluation              AM-PAC OT "6 Clicks" Daily Activity     Outcome Measure Help from another person eating meals?: None Help from another person taking care of personal grooming?: None Help from another person toileting, which includes using toliet, bedpan, or urinal?: A Little Help from another person bathing (including washing, rinsing, drying)?: A Little Help from another person to put on and taking off regular upper body clothing?: None Help from another person to put on and taking off regular lower body clothing?: A Little 6 Click Score: 21   End of Session Equipment Utilized During Treatment: Gait belt;Rolling walker (2 wheels) Nurse Communication: Mobility status  Activity Tolerance: Patient tolerated treatment well Patient left: in bed;with call  bell/phone within reach  OT Visit Diagnosis: Unsteadiness on feet (R26.81);Other abnormalities of gait and mobility (R26.89);Muscle weakness (generalized) (M62.81)                Time: 6333-5456 OT Time Calculation (min): 44 min Charges:  OT General Charges $OT Visit: 1 Visit OT Evaluation $OT Eval Moderate Complexity: 1 Mod OT Treatments $Self Care/Home Management : 8-22 mins $Therapeutic Activity: 8-22 mins  Minal Stuller H., OTR/L Acute Rehabilitation  Dionne Knoop Elane Kodi Guerrera 04/05/2022, 6:59 PM

## 2022-04-05 NOTE — Discharge Instructions (Signed)
ASA + Plavix for 3 weeks followed by ASA indefinitely  Lipitor ordered Decadron for 7 days orally  Follow up outpatient Pain management and Neurosurgery PPI daily

## 2022-04-05 NOTE — Plan of Care (Signed)
  Problem: Education: Goal: Knowledge of General Education information will improve Description: Including pain rating scale, medication(s)/side effects and non-pharmacologic comfort measures Outcome: Progressing   Problem: Health Behavior/Discharge Planning: Goal: Ability to manage health-related needs will improve Outcome: Progressing   Problem: Clinical Measurements: Goal: Ability to maintain clinical measurements within normal limits will improve Outcome: Progressing Goal: Will remain free from infection Outcome: Progressing Goal: Diagnostic test results will improve Outcome: Progressing Goal: Respiratory complications will improve Outcome: Progressing Goal: Cardiovascular complication will be avoided Outcome: Progressing   Problem: Activity: Goal: Risk for activity intolerance will decrease Outcome: Progressing   Problem: Nutrition: Goal: Adequate nutrition will be maintained Outcome: Progressing   Problem: Coping: Goal: Level of anxiety will decrease Outcome: Progressing   Problem: Elimination: Goal: Will not experience complications related to bowel motility Outcome: Progressing Goal: Will not experience complications related to urinary retention Outcome: Progressing   Problem: Pain Managment: Goal: General experience of comfort will improve Outcome: Progressing   Problem: Safety: Goal: Ability to remain free from injury will improve Outcome: Progressing   Problem: Skin Integrity: Goal: Risk for impaired skin integrity will decrease Outcome: Progressing   Problem: Education: Goal: Knowledge of disease or condition will improve Outcome: Progressing Goal: Knowledge of secondary prevention will improve (SELECT ALL) Outcome: Progressing Goal: Knowledge of patient specific risk factors will improve (INDIVIDUALIZE FOR PATIENT) Outcome: Progressing Goal: Individualized Educational Video(s) Outcome: Progressing   Problem: Health Behavior/Discharge  Planning: Goal: Ability to manage health-related needs will improve Outcome: Progressing   Problem: Ischemic Stroke/TIA Tissue Perfusion: Goal: Complications of ischemic stroke/TIA will be minimized Outcome: Progressing   

## 2022-04-05 NOTE — Evaluation (Signed)
Speech Language Pathology Evaluation Patient Details Name: April Hunt MRN: [id number] DOB: 09-22-1950 Today's Date: 04/05/2022 Time: 6734-1937 SLP Time Calculation (min) (ACUTE ONLY): 18 min  Problem List:  Patient Active Problem List   Diagnosis Date Noted   Acute stroke due to ischemia (HCC) 04/03/2022   Lumbar degenerative disc disease 03/07/2022   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   Post-menopause atrophic vaginitis 02/08/2017   GERD without esophagitis 03/07/2016   Essential hypertension 11/16/2014   Osteoarthritis of right knee 12/26/2013   Mild intermittent asthma without complication 12/25/2011   Past Medical History:  Past Medical History:  Diagnosis Date   Allergy    seasonal   Arthritis    Asthma 12/25/2011   Asthma 12/25/2011   Formatting of this note might be different from the original. Last Assessment & Plan:  Currently stable with PRN albuterol.   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   Pure hypercholesterolemia    Refusal of blood transfusions as patient is Jehovah's Witness    Sinusitis 03/04/2013   Tinea pedis 03/19/2012   Tinea  versicolor 03/19/2012   Past Surgical History:  Past Surgical History:  Procedure Laterality Date   ABDOMINAL HYSTERECTOMY  1998   Still has cervix and both ovaries   APPENDECTOMY     BACK SURGERY     JOINT REPLACEMENT     scope bil knees   KNEE ARTHROSCOPY  1997   right knee   Lymph node resection  1999   benign   MENISCUS REPAIR  1995   left knee   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   DR CAFFREY   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   Procedure: RIGHT TOTAL KNEE ARTHROPLASTY;  Surgeon: Thera Flake., MD;  Location: [facility] OR;  Service: Orthopedics;  Laterality: Right;   HPI:  Pt is a 71 y.o. female admitted with RLE weakness and facial paresthesia. MRI brain negative for acute changes. PMH:  hypertension and hyperlipidemia.   Assessment / Plan / Recommendation Clinical Impression  Pt participated in speech-language-cognition evaluation. Pt reported that she has some difficulty with memory at baseline. She stated that her cognition has felt "foggy" since admission with worsened memory and difficulty with problem solving and "decision making". The [facility] Mental Status Examination was completed to evaluate the pt's cognitive-linguistic skills. She achieved a score of 22/30 which is below the normal limits of 27 or more out of 30 and is suggestive of a mild impairment. She exhibited difficulty with memory and attention, but stated that she does have an underlying  diagnosis of ADD. Pt stated that she believes her memory is worse than her baseline, but questioned whether she is just very distracted by being in the hospital. Considering pt's negative imaging, SLP services will be discontinued at this time, but pt has agreed to follow up with her PCP if her memory skills do not return to baseline.    SLP Assessment  SLP Recommendation/Assessment: All further Speech Lanaguage Pathology  needs can be addressed in the next venue of care SLP Visit Diagnosis: Cognitive  communication deficit (R41.841)    Recommendations for follow up therapy are one component of a multi-disciplinary discharge planning process, led by the attending physician.  Recommendations may be updated based on patient status, additional functional criteria and insurance authorization.    Follow Up Recommendations  No SLP follow up (unless impairments inmemory do not resolve.)    Assistance Recommended at Discharge     Functional Status Assessment    Frequency and Duration           SLP Evaluation Cognition  Overall Cognitive Status: Within Functional Limits for tasks assessed Arousal/Alertness: Awake/alert Orientation Level: Oriented X4 Year: 2023 Month: July Day of Week: Correct Attention: Focused;Sustained;Selective Focused Attention: Appears intact Sustained Attention: Appears intact Selective Attention: Impaired Selective Attention Impairment: Verbal complex Memory: Impaired Memory Impairment:  (Immediate: 5/5; delayed: 3/5; paragraph: 2/8) Awareness: Appears intact Problem Solving: Appears intact (Money: 3/3; time: 1/1 with repetition) Executive Function: Sequencing;Organizing Sequencing: Appears intact (clock: 4/4) Organizing:  (backward digit span: 2/2)       Comprehension  Auditory Comprehension Overall Auditory Comprehension: Appears within functional limits for tasks assessed Yes/No Questions: Within Functional Limits Commands: Within Functional Limits Conversation: Complex Reading Comprehension Reading Status: Within funtional limits    Expression Expression Primary Mode of Expression: Verbal Verbal Expression Overall Verbal Expression: Appears within functional limits for tasks assessed Initiation: No impairment Level of Generative/Spontaneous Verbalization: Conversation Repetition: No impairment Naming: No impairment Pragmatics: No impairment Written Expression Dominant Hand: Right   Oral / Motor  Motor Speech Overall Motor Speech: Appears  within functional limits for tasks assessed Respiration: Within functional limits Phonation: Normal Resonance: Within functional limits Articulation: Within functional limitis Intelligibility: Intelligible Motor Planning: Witnin functional limits           Krystall Kruckenberg I. Vear Clock, MS, CCC-SLP Acute Rehabilitation Services Office number [phone] Pager [phone]  Scheryl Marten 04/05/2022, 11:50 AM

## 2022-04-05 NOTE — Progress Notes (Signed)
  Echocardiogram 2D Echocardiogram has been performed.  Roosvelt Maser F 04/05/2022, 1:02 PM

## 2022-04-21 ENCOUNTER — Ambulatory Visit: Payer: Medicare Other | Attending: Internal Medicine | Admitting: Physical Therapy

## 2022-04-21 ENCOUNTER — Encounter: Payer: Self-pay | Admitting: Physical Therapy

## 2022-04-21 DIAGNOSIS — R2681 Unsteadiness on feet: Secondary | ICD-10-CM | POA: Diagnosis present

## 2022-04-21 DIAGNOSIS — R278 Other lack of coordination: Secondary | ICD-10-CM | POA: Diagnosis present

## 2022-04-21 DIAGNOSIS — R2990 Unspecified symptoms and signs involving the nervous system: Secondary | ICD-10-CM | POA: Insufficient documentation

## 2022-04-21 DIAGNOSIS — M6281 Muscle weakness (generalized): Secondary | ICD-10-CM | POA: Insufficient documentation

## 2022-04-21 DIAGNOSIS — I69351 Hemiplegia and hemiparesis following cerebral infarction affecting right dominant side: Secondary | ICD-10-CM | POA: Diagnosis present

## 2022-04-21 DIAGNOSIS — R262 Difficulty in walking, not elsewhere classified: Secondary | ICD-10-CM | POA: Insufficient documentation

## 2022-04-21 NOTE — Therapy (Signed)
OUTPATIENT PHYSICAL THERAPY NEURO EVALUATION   Patient Name: April Hunt MRN: [id number] DOB:1951/08/29, 71 y.o., female Today's Date: 04/21/2022   PCP: [name], [name] REFERRING PROVIDER: Dimple Nanas, MD    PT End of Session - 04/21/22 1051     Visit Number 1    Date for PT Re-Evaluation 07/14/22    PT Start Time 1021    PT Stop Time 1056    PT Time Calculation (min) 35 min    Activity Tolerance Patient tolerated treatment well    Behavior During Therapy [facility] for tasks assessed/performed             Past Medical History:  Diagnosis Date   Allergy    seasonal   Arthritis    Asthma 12/25/2011   Asthma 12/25/2011   Formatting of this note might be different from the original. Last Assessment & Plan:  Currently stable with PRN albuterol.   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   Pure hypercholesterolemia    Refusal of blood transfusions as patient is Jehovah's Witness    Sinusitis 03/04/2013   Tinea pedis 03/19/2012   Tinea versicolor 03/19/2012   Past Surgical History:  Procedure Laterality Date   ABDOMINAL HYSTERECTOMY  1998   Still has cervix  and both ovaries   APPENDECTOMY     BACK SURGERY     JOINT REPLACEMENT     scope bil knees   KNEE ARTHROSCOPY  1997   right knee   Lymph node resection  1999   benign   MENISCUS REPAIR  1995   left knee   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   DR CAFFREY   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   Procedure: RIGHT TOTAL KNEE ARTHROPLASTY;  Surgeon: Thera Flake., MD;  Location: [facility] OR;  Service: Orthopedics;  Laterality: Right;   Patient Active Problem List   Diagnosis Date Noted   Acute stroke due to ischemia (HCC) 04/03/2022   Lumbar degenerative disc disease 03/07/2022   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   Post-menopause atrophic vaginitis 02/08/2017   GERD without esophagitis 03/07/2016   Essential hypertension 11/16/2014   Osteoarthritis of right knee 12/26/2013   Mild intermittent asthma without complication 12/25/2011    ONSET DATE: 04/05/2022   REFERRING DIAG: Diagnosis R29.90 (ICD-10-CM) - Stroke-like symptoms   THERAPY DIAG:  Hemiplegia and hemiparesis following cerebral infarction affecting right dominant side (HCC)  Muscle weakness (generalized)  Difficulty in walking, not elsewhere classified  Other lack of coordination  Unsteadiness on feet  Rationale for Evaluation and Treatment  Rehabilitation  SUBJECTIVE:                                                                                                                                                                                              SUBJECTIVE STATEMENT: Patient reports that she was in the shower when she had a severe episode of numbness on her R.  Pt accompanied by: family member  PERTINENT HISTORY: 71 year old Jehovah's Witness comes to the hospital with complaints of GERD, HTN, HLD, asthma, history of lumbar radiculopathy status post laminectomy/fusion came to [facility] with complains of right facial numbness and tingling along with right lower extremity weakness.  Neurology  recommended admitting patient for stroke work-up.  CT of the head was negative.  She was started on aspirin and Plavix.  Patient was seen by neurology team.  A1c 5.4, UDS negative.  MRI brain was also negative, LDL was 155.  Right-sided facial symptoms resolved but right lower extremity weakness which persisted therefore lumbar spine was ordered.  MRI spine showed disc bulge with slight neurocompression.  Case was discussed with Dr. Franky Macho from neurosurgery who recommended outpatient follow-up in 7 days of Decadron otherwise no acute surgical needs at this time. Later in the day had extensive discussion with patient regarding her care and her sister was present at bedside.  I explained to patient that she will need to follow-up outpatient with pain management for her lower back and right lower extremity pain and weakness, take 7 days of oral Decadron and get outpatient neurosurgery referral by her PCP. She also tells me that she tells me that she is not allergic to sulfa tells me that she is not allergic to aspirin as long as enteric-coated therefore willing to take aspirin and Plavix for 3 weeks followed by aspirin.  PAIN:  Are you having pain? Yes: NPRS scale: 10/10 Pain location: whole R side Pain description: cramping Aggravating factors: end of day Relieving factors: rest  PRECAUTIONS: None  WEIGHT BEARING RESTRICTIONS No  FALLS: Has patient fallen in last 6 months? No  LIVING ENVIRONMENT: Lives with: lives with their family Lives in: House/apartment Stairs: Yes: Internal: 12 steps; on right going up Has following equipment at home: Single point cane and Walker - 4 wheeled  PLOF: Independent  PATIENT GOALS Walk on her own, return to her normal routine.  OBJECTIVE:   DIAGNOSTIC FINDINGS: -CT head, MRI brain is negative for acute pathology.  CTA head and neck is also negative.  A1c 5.4, UDS negative, LDL 155.  Suspicion still remains for TIA.  A.  A.  Neurology recommending aspirin  Plavix for 3  weeks followed by aspirin. -MRI lumbar spine showed bulging of the disc with nerve root compression.  Discussed with Dr Franky Macho who reviewed her MRI- no surgical indication, ok for week long decadron and outptn follow up.  She follows at [facility] pain management for monthly epidural bupivacaine/Kenalog injections. --Echocardiogram normal ef  COGNITION: Overall cognitive status: Within functional limits for tasks assessed   SENSATION: Patient reports numbness on R face, arm and leg  COORDINATION: Mildly slow fingers to thumb   MUSCLE TONE: RLE: Within functional limits   MUSCLE LENGTH: Hamstrings: Right 50 deg; Left 80 deg  POSTURE: No Significant postural limitations  LOWER EXTREMITY ROM:    PROM grossly WFL, R HS tightness noted.  LOWER EXTREMITY MMT:  LLE WNL  MMT Right Eval   Hip flexion 2   Hip extension 2   Hip abduction 2   Hip adduction    Hip internal rotation    Hip external rotation    Knee flexion 2   Knee extension 2   Ankle dorsiflexion 2+   Ankle plantarflexion    Ankle inversion    Ankle eversion    (Blank rows = not tested)  BED MOBILITY: I   TRANSFERS: Assistive device utilized: Environmental consultant - 4 wheeled  Sit to stand: Modified independence Stand to sit: Modified independence Chair to chair: Modified independence  STAIRS:  Level of Assistance: Modified independence  Stair Negotiation Technique: Step to Pattern with Bilateral Rails  Number of Stairs: 12   Height of Stairs: 6  Comments: Per patient report, to access her home.  GAIT: Gait pattern: step to pattern, decreased arm swing- Right, decreased arm swing- Left, decreased step length- Right, decreased stance time- Right, Right hip hike, trendelenburg, lateral hip instability, and trunk flexed Distance walked: 59' Assistive device utilized: Environmental consultant - 4 wheeled Level of assistance: Modified independence Comments: Patient moves very slow, approximately 2 minutes to travel  58'  FUNCTIONAL TESTs:  5 times sit to stand: 41.08 Timed up and go (TUG): TBD Berg Balance Scale: TBD (31 in inpatient)  TODAY'S TREATMENT:  Education   PATIENT EDUCATION: Education details: POC, verbal HEP Person educated: Patient and sister Education method: Medical illustrator Education comprehension: verbalized understanding and returned demonstration   HOME EXERCISE PROGRAM: Verbal-hip flexion, knee ext, ankle DF/PF-all in sitting. Consciously use RLE in all activities as much as possible.    GOALS: Goals reviewed with patient? Yes  SHORT TERM GOALS: Target date: 05/19/2022  I with basic HEP Baseline: Goal status: INITIAL  2.  5x STS in < 24 seconds Baseline: 41 Goal status: INITIAL LONG TERM GOALS: Target date: 07/14/2022  I with final HEP Baseline:  Goal status: INITIAL  2.  5 x STS in < 15 seconds Baseline: 41 Goal status: INITIAL  3.  Increase RLE strength to at least 4-/5 throughout Baseline: 2/5 Goal status: INITIAL  4.  Paitent will ambulate x at least 400' with LRAD, MI, minimized gait deviations, functional speed. Baseline:  Goal status: INITIAL  5.  Patient will score at least 40/56 on BERG balance assessment. Baseline: 31/56 in hospital Goal status: INITIAL  6.  Paitent will complete TUG in < 15 sec with LRAD. Baseline: TBD Goal status: INITIAL  ASSESSMENT:  CLINICAL IMPRESSION: Patient is a 71 y.o. who was seen today for physical therapy evaluation and treatment for weakness, decreased balance, decreased functional use of RLE, decreased safety and I with all functional mobility..    OBJECTIVE IMPAIRMENTS Abnormal gait, decreased activity tolerance,  decreased balance, decreased coordination, decreased endurance, decreased mobility, difficulty walking, decreased strength, impaired UE functional use, improper body mechanics, postural dysfunction, and pain.   ACTIVITY LIMITATIONS carrying, lifting, bending, standing,  squatting, sleeping, stairs, transfers, bathing, toileting, dressing, reach over head, hygiene/grooming, locomotion level, and caring for others  PARTICIPATION LIMITATIONS: meal prep, cleaning, laundry, shopping, and community activity  PERSONAL FACTORS Age are also affecting patient's functional outcome.   REHAB POTENTIAL: Good  CLINICAL DECISION MAKING: Evolving/moderate complexity  EVALUATION COMPLEXITY: Moderate  PLAN: PT FREQUENCY: 2x/week  PT DURATION: 12 weeks  PLANNED INTERVENTIONS: Therapeutic exercises, Therapeutic activity, Neuromuscular re-education, Balance training, Gait training, Patient/Family education, Self Care, Joint mobilization, Stair training, Ionotophoresis 4mg /ml Dexamethasone, and Manual therapy  PLAN FOR NEXT SESSION: Complete TUG, BERG, initiated written HEP. OT consult requested.   , DPT 04/21/2022, 12:14 PM

## 2022-04-25 ENCOUNTER — Encounter: Payer: Self-pay | Admitting: Physical Therapy

## 2022-04-25 ENCOUNTER — Ambulatory Visit: Payer: Medicare Other | Admitting: Physical Therapy

## 2022-04-25 DIAGNOSIS — I69351 Hemiplegia and hemiparesis following cerebral infarction affecting right dominant side: Secondary | ICD-10-CM

## 2022-04-25 DIAGNOSIS — R262 Difficulty in walking, not elsewhere classified: Secondary | ICD-10-CM

## 2022-04-25 DIAGNOSIS — R2681 Unsteadiness on feet: Secondary | ICD-10-CM

## 2022-04-25 DIAGNOSIS — R278 Other lack of coordination: Secondary | ICD-10-CM

## 2022-04-25 DIAGNOSIS — M6281 Muscle weakness (generalized): Secondary | ICD-10-CM

## 2022-04-25 NOTE — Therapy (Addendum)
OUTPATIENT PHYSICAL THERAPY NEURO EVALUATION   Patient Name: April Hunt MRN: [id number] DOB:[birth year], 71 y.o., female Today's Date: 04/25/2022   PCP: April Hunt, April Hunt REFERRING PROVIDER: Dimple Nanas, MD    PT End of Session - 04/25/22 1042     Visit Number 2    Date for PT Re-Evaluation 07/14/22    PT Start Time 1010    PT Stop Time 1050    PT Time Calculation (min) 40 min    Activity Tolerance Patient tolerated treatment well    Behavior During Therapy [facility] for tasks assessed/performed              Past Medical History:  Diagnosis Date   Allergy    seasonal   Arthritis    Asthma 12/25/2011   Asthma 12/25/2011   Formatting of this note might be different from the original. Last Assessment & Plan:  Currently stable with PRN albuterol.   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   Pure hypercholesterolemia    Refusal of blood transfusions as patient is Jehovah's Witness    Sinusitis 03/04/2013   Tinea pedis 03/19/2012   Tinea versicolor 03/19/2012   Past Surgical History:  Procedure Laterality Date   ABDOMINAL HYSTERECTOMY  1998   Still has  cervix and both ovaries   APPENDECTOMY     BACK SURGERY     JOINT REPLACEMENT     scope bil knees   KNEE ARTHROSCOPY  1997   right knee   Lymph node resection  1999   benign   MENISCUS REPAIR  1995   left knee   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   April Hunt   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   Procedure: RIGHT TOTAL KNEE ARTHROPLASTY;  Surgeon: April Hunt., MD;  Location: [facility] OR;  Service: Orthopedics;  Laterality: Right;   Patient Active Problem List   Diagnosis Date Noted   Acute stroke due to ischemia (HCC) 04/03/2022   Lumbar degenerative disc disease 03/07/2022   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   Post-menopause atrophic vaginitis 02/08/2017   GERD without esophagitis 03/07/2016   Essential hypertension 11/16/2014   Osteoarthritis of right knee 12/26/2013   Mild intermittent asthma without complication 12/25/2011    ONSET DATE: 04/05/2022   REFERRING DIAG: Diagnosis R29.90 (ICD-10-CM) - Stroke-like symptoms   THERAPY DIAG:  Hemiplegia and hemiparesis following cerebral infarction affecting right dominant side (HCC)  Other lack of coordination  Muscle weakness (generalized)  Unsteadiness on feet  Difficulty in walking, not elsewhere classified  Rationale for Evaluation and  Treatment Rehabilitation  SUBJECTIVE:                                                                                                                                                                                              SUBJECTIVE STATEMENT: Patient reports  no changes since her last appointment Pt accompanied by: family member  PERTINENT HISTORY: 71 year old Jehovah's Witness comes to the hospital with complaints of GERD, HTN, HLD, asthma, history of lumbar radiculopathy status post laminectomy/fusion came to [facility] with complains of right facial numbness and tingling along with right lower extremity weakness.  Neurology recommended admitting patient for  stroke work-up.  CT of the head was negative.  She was started on aspirin and Plavix.  Patient was seen by neurology team.  A1c 5.4, UDS negative.  MRI brain was also negative, LDL was 155.  Right-sided facial symptoms resolved but right lower extremity weakness which persisted therefore lumbar spine was ordered.  MRI spine showed disc bulge with slight neurocompression.  Case was discussed with April. Franky Hunt from neurosurgery who recommended outpatient follow-up in 7 days of Decadron otherwise no acute surgical needs at this time. Later in the day had extensive discussion with patient regarding her care and her sister was present at bedside.  I explained to patient that she will need to follow-up outpatient with pain management for her lower back and right lower extremity pain and weakness, take 7 days of oral Decadron and get outpatient neurosurgery referral by her PCP. She also tells me that she tells me that she is not allergic to sulfa tells me that she is not allergic to aspirin as long as enteric-coated therefore willing to take aspirin and Plavix for 3 weeks followed by aspirin.  PAIN:  Are you having pain? Yes: NPRS scale: 10/10 Pain location: whole R side Pain description: cramping Aggravating factors: end of day Relieving factors: rest  PRECAUTIONS: None  WEIGHT BEARING RESTRICTIONS No  FALLS: Has patient fallen in last 6 months? No  LIVING ENVIRONMENT: Lives with: lives with their family Lives in: House/apartment Stairs: Yes: Internal: 12 steps; on right going up Has following equipment at home: Single point cane and Walker - 4 wheeled  PLOF: Independent  PATIENT GOALS Walk on her own, return to her normal routine.  OBJECTIVE:   DIAGNOSTIC FINDINGS: -CT head, MRI brain is negative for acute pathology.  CTA head and neck is also negative.  A1c 5.4, UDS negative, LDL 155.  Suspicion still remains for TIA.  A.  A.  Neurology recommending aspirin Plavix for 3 weeks followed by  aspirin. -MRI lumbar spine showed bulging of  the disc with nerve root compression.  Discussed with April April Hunt who reviewed her MRI- no surgical indication, ok for week long decadron and outptn follow up.  She follows at [facility] pain management for monthly epidural bupivacaine/Kenalog injections. --Echocardiogram normal ef  COGNITION: Overall cognitive status: Within functional limits for tasks assessed   SENSATION: Patient reports numbness on R face, arm and leg  COORDINATION: Mildly slow fingers to thumb   MUSCLE TONE: RLE: Within functional limits   MUSCLE LENGTH: Hamstrings: Right 50 deg; Left 80 deg  POSTURE: No Significant postural limitations  LOWER EXTREMITY ROM:    PROM grossly WFL, R HS tightness noted.  LOWER EXTREMITY MMT:  LLE WNL  MMT Right Eval   Hip flexion 2   Hip extension 2   Hip abduction 2   Hip adduction    Hip internal rotation    Hip external rotation    Knee flexion 2   Knee extension 2   Ankle dorsiflexion 2+   Ankle plantarflexion    Ankle inversion    Ankle eversion    (Blank rows = not tested)  BED MOBILITY: I   TRANSFERS: Assistive device utilized: Environmental consultant - 4 wheeled  Sit to stand: Modified independence Stand to sit: Modified independence Chair to chair: Modified independence  STAIRS:  Level of Assistance: Modified independence  Stair Negotiation Technique: Step to Pattern with Bilateral Rails  Number of Stairs: 12   Height of Stairs: 6  Comments: Per patient report, to access her home.  GAIT: Gait pattern: step to pattern, decreased arm swing- Right, decreased arm swing- Left, decreased step length- Right, decreased stance time- Right, Right hip hike, trendelenburg, lateral hip instability, and trunk flexed Distance walked: 41' Assistive device utilized: Environmental consultant - 4 wheeled Level of assistance: Modified independence Comments: Patient moves very slow, approximately 2 minutes to travel 35'  FUNCTIONAL TESTs:  5 times sit to  stand: 41.08 Timed up and go (TUG): TBD Berg Balance Scale: TBD (31 in inpatient)  TODAY'S TREATMENT:  04/25/22 NuStep at L2 x 5 minutes, required Vc and TC to release LLE when attempting to push with R TUG-23.34 BERG 37 Seated knee flexion and ext with R foot on towel to decrease friction-very slow and labored in both directions, x 3 Seated heel raise/toe raise Standing stepping forward and back. Sit to/from stand x 5   Education   PATIENT EDUCATION: Education details: POC, verbal HEP Person educated: Patient and sister Education method: Medical illustrator Education comprehension: verbalized understanding and returned demonstration   HOME EXERCISE PROGRAM: Verbal-hip flexion, knee ext, ankle DF/PF-all in sitting. Consciously use RLE in all activities as much as possible.  [id number]    GOALS: Goals reviewed with patient? Yes  SHORT TERM GOALS: Target date: 05/23/2022  I with basic HEP Baseline: Goal status: ongoing  2.  5x STS in < 24 seconds Baseline: 41 Goal status: ongoing LONG TERM GOALS: Target date: 07/14/2022  I with final HEP Baseline:  Goal status: INITIAL  2.  5 x STS in < 15 seconds Baseline: 41 Goal status: INITIAL  3.  Increase RLE strength to at least 4-/5 throughout Baseline: 2/5 Goal status: INITIAL  4.  Paitent will ambulate x at least 400' with LRAD, MI, minimized gait deviations, functional speed. Baseline:  Goal status: INITIAL  5.  Patient will score at least 40/56 on BERG balance assessment. Baseline: 31/56 in hospital, 8/8-37 Goal status: ongoing  6.  Paitent will complete TUG in < 15 sec with  LRAD. Baseline: 23.34 Goal status: ongoing   ASSESSMENT:  CLINICAL IMPRESSION: Patient reports no real changes. Her function in her RLE fluctuated at times. She was able to stand without need for UE support and walked with normal gait pattern at times, but dragging RLE at other times. She had great difficulty performing  specific RLE activities.   OBJECTIVE IMPAIRMENTS Abnormal gait, decreased activity tolerance, decreased balance, decreased coordination, decreased endurance, decreased mobility, difficulty walking, decreased strength, impaired UE functional use, improper body mechanics, postural dysfunction, and pain.   ACTIVITY LIMITATIONS carrying, lifting, bending, standing, squatting, sleeping, stairs, transfers, bathing, toileting, dressing, reach over head, hygiene/grooming, locomotion level, and caring for others  PARTICIPATION LIMITATIONS: meal prep, cleaning, laundry, shopping, and community activity  PERSONAL FACTORS Age are also affecting patient's functional outcome.   REHAB POTENTIAL: Good  CLINICAL DECISION MAKING: Evolving/moderate complexity  EVALUATION COMPLEXITY: Moderate  PLAN: PT FREQUENCY: 2x/week  PT DURATION: 12 weeks  PLANNED INTERVENTIONS: Therapeutic exercises, Therapeutic activity, Neuromuscular re-education, Balance training, Gait training, Patient/Family education, Self Care, Joint mobilization, Stair training, Ionotophoresis 4mg /ml Dexamethasone, and Manual therapy  PLAN FOR NEXT SESSION: Progressive NM re-ed for her R LE.  , DPT 04/25/2022, 12:05 PM

## 2022-04-27 ENCOUNTER — Ambulatory Visit: Payer: Medicare Other | Admitting: Physical Therapy

## 2022-04-27 ENCOUNTER — Encounter: Payer: Self-pay | Admitting: Physical Therapy

## 2022-04-27 DIAGNOSIS — M6281 Muscle weakness (generalized): Secondary | ICD-10-CM

## 2022-04-27 DIAGNOSIS — I69351 Hemiplegia and hemiparesis following cerebral infarction affecting right dominant side: Secondary | ICD-10-CM | POA: Diagnosis not present

## 2022-04-27 DIAGNOSIS — R2681 Unsteadiness on feet: Secondary | ICD-10-CM

## 2022-04-27 DIAGNOSIS — R278 Other lack of coordination: Secondary | ICD-10-CM

## 2022-04-27 DIAGNOSIS — R262 Difficulty in walking, not elsewhere classified: Secondary | ICD-10-CM

## 2022-04-27 NOTE — Therapy (Signed)
OUTPATIENT PHYSICAL THERAPY NEURO TREATMENT   Patient Name: April Hunt MRN: [id number] DOB:08-09-1951, 71 y.o., female Today's Date: 04/27/2022   PCP: [name], [name] REFERRING PROVIDER: Dimple Nanas, MD    PT End of Session - 04/27/22 1017     Visit Number 3    Date for PT Re-Evaluation 07/14/22    PT Start Time 1014    PT Stop Time 1100    PT Time Calculation (min) 46 min    Activity Tolerance Patient tolerated treatment well    Behavior During Therapy [facility] for tasks assessed/performed              Past Medical History:  Diagnosis Date   Allergy    seasonal   Arthritis    Asthma 12/25/2011   Asthma 12/25/2011   Formatting of this note might be different from the original. Last Assessment & Plan:  Currently stable with PRN albuterol.   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   Pure hypercholesterolemia    Refusal of blood transfusions as patient is Jehovah's Witness    Sinusitis 03/04/2013   Tinea pedis 03/19/2012   Tinea versicolor 03/19/2012   Past Surgical History:  Procedure Laterality Date   ABDOMINAL HYSTERECTOMY  1998   Still has  cervix and both ovaries   APPENDECTOMY     BACK SURGERY     JOINT REPLACEMENT     scope bil knees   KNEE ARTHROSCOPY  1997   right knee   Lymph node resection  1999   benign   MENISCUS REPAIR  1995   left knee   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   DR CAFFREY   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   Procedure: RIGHT TOTAL KNEE ARTHROPLASTY;  Surgeon: Thera Flake., MD;  Location: [facility] OR;  Service: Orthopedics;  Laterality: Right;   Patient Active Problem List   Diagnosis Date Noted   Acute stroke due to ischemia (HCC) 04/03/2022   Lumbar degenerative disc disease 03/07/2022   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   Post-menopause atrophic vaginitis 02/08/2017   GERD without esophagitis 03/07/2016   Essential hypertension 11/16/2014   Osteoarthritis of right knee 12/26/2013   Mild intermittent asthma without complication 12/25/2011    ONSET DATE: 04/05/2022   REFERRING DIAG: Diagnosis R29.90 (ICD-10-CM) - Stroke-like symptoms   THERAPY DIAG:  Hemiplegia and hemiparesis following cerebral infarction affecting right dominant side (HCC)  Other lack of coordination  Muscle weakness (generalized)  Unsteadiness on feet  Difficulty in walking, not elsewhere classified  Rationale for Evaluation and  Treatment Rehabilitation  SUBJECTIVE:                                                                                                                                                                                              SUBJECTIVE STATEMENT: Tired and sore, felt like I worked a lot Pt accompanied by: family member  PERTINENT HISTORY: 71 year old Jehovah's Witness comes to the hospital with complaints of GERD, HTN, HLD, asthma, history of lumbar radiculopathy status post laminectomy/fusion came to [facility] with complains of right facial numbness and tingling along with right lower extremity weakness.  Neurology recommended admitting patient for stroke work-up.   CT of the head was negative.  She was started on aspirin and Plavix.  Patient was seen by neurology team.  A1c 5.4, UDS negative.  MRI brain was also negative, LDL was 155.  Right-sided facial symptoms resolved but right lower extremity weakness which persisted therefore lumbar spine was ordered.  MRI spine showed disc bulge with slight neurocompression.  Case was discussed with Dr. Franky Macho from neurosurgery who recommended outpatient follow-up in 7 days of Decadron otherwise no acute surgical needs at this time. Later in the day had extensive discussion with patient regarding her care and her sister was present at bedside.  I explained to patient that she will need to follow-up outpatient with pain management for her lower back and right lower extremity pain and weakness, take 7 days of oral Decadron and get outpatient neurosurgery referral by her PCP. She also tells me that she tells me that she is not allergic to sulfa tells me that she is not allergic to aspirin as long as enteric-coated therefore willing to take aspirin and Plavix for 3 weeks followed by aspirin.  PAIN:  Are you having pain? Yes: NPRS scale: 10/10 Pain location: whole R side Pain description: cramping Aggravating factors: end of day Relieving factors: rest  PRECAUTIONS: None  WEIGHT BEARING RESTRICTIONS No  FALLS: Has patient fallen in last 6 months? No  LIVING ENVIRONMENT: Lives with: lives with their family Lives in: House/apartment Stairs: Yes: Internal: 12 steps; on right going up Has following equipment at home: Single point cane and Walker - 4 wheeled  PLOF: Independent  PATIENT GOALS Walk on her own, return to her normal routine.  OBJECTIVE:   DIAGNOSTIC FINDINGS: -CT head, MRI brain is negative for acute pathology.  CTA head and neck is also negative.  A1c 5.4, UDS negative, LDL 155.  Suspicion still remains for TIA.  A.  A.  Neurology recommending aspirin Plavix for 3 weeks followed by aspirin. -MRI lumbar  spine showed bulging of  the disc with nerve root compression.  Discussed with Dr Franky Macho who reviewed her MRI- no surgical indication, ok for week long decadron and outptn follow up.  She follows at [facility] pain management for monthly epidural bupivacaine/Kenalog injections. --Echocardiogram normal ef  COGNITION: Overall cognitive status: Within functional limits for tasks assessed   SENSATION: Patient reports numbness on R face, arm and leg  COORDINATION: Mildly slow fingers to thumb   MUSCLE TONE: RLE: Within functional limits   MUSCLE LENGTH: Hamstrings: Right 50 deg; Left 80 deg  POSTURE: No Significant postural limitations  LOWER EXTREMITY ROM:    PROM grossly WFL, R HS tightness noted.  LOWER EXTREMITY MMT:  LLE WNL  MMT Right Eval   Hip flexion 2   Hip extension 2   Hip abduction 2   Hip adduction    Hip internal rotation    Hip external rotation    Knee flexion 2   Knee extension 2   Ankle dorsiflexion 2+   Ankle plantarflexion    Ankle inversion    Ankle eversion    (Blank rows = not tested)  BED MOBILITY: I   TRANSFERS: Assistive device utilized: Environmental consultant - 4 wheeled  Sit to stand: Modified independence Stand to sit: Modified independence Chair to chair: Modified independence  STAIRS:  Level of Assistance: Modified independence  Stair Negotiation Technique: Step to Pattern with Bilateral Rails  Number of Stairs: 12   Height of Stairs: 6  Comments: Per patient report, to access her home.  GAIT: Gait pattern: step to pattern, decreased arm swing- Right, decreased arm swing- Left, decreased step length- Right, decreased stance time- Right, Right hip hike, trendelenburg, lateral hip instability, and trunk flexed Distance walked: 65' Assistive device utilized: Environmental consultant - 4 wheeled Level of assistance: Modified independence Comments: Patient moves very slow, approximately 2 minutes to travel 61'  FUNCTIONAL TESTs:  5 times sit to stand: 41.08 Timed up  and go (TUG): TBD Berg Balance Scale: TBD (31 in inpatient)  TODAY'S TREATMENT:  04/27/22 Nustep level 3 x 6 minutes Ball b/n knees squeeze LAQ on the left 2.5#, AAROM LAQ on the right Right ankle red tband PF, AAROM DF right ankle Marching on the left 2.5#, right AAROM Stairs 6" and 4" up and down with CGA, one at a time Supine feet on ball K2C, trunk rotation, small bridges, isometric abs SAQ with left leg assist 3x5  04/25/22 NuStep at L2 x 5 minutes, required Vc and TC to release LLE when attempting to push with R TUG-23.34 BERG 37 Seated knee flexion and ext with R foot on towel to decrease friction-very slow and labored in both directions, x 3 Seated heel raise/toe raise Standing stepping forward and back. Sit to/from stand x 5   Education   PATIENT EDUCATION: Education details: POC, verbal HEP Person educated: Patient and sister Education method: Medical illustrator Education comprehension: verbalized understanding and returned demonstration   HOME EXERCISE PROGRAM: Verbal-hip flexion, knee ext, ankle DF/PF-all in sitting. Consciously use RLE in all activities as much as possible.  [id number]    GOALS: Goals reviewed with patient? Yes  SHORT TERM GOALS: Target date: 05/23/2022  I with basic HEP Baseline: Goal status: ongoing  2.  5x STS in < 24 seconds Baseline: 41 Goal status: ongoing LONG TERM GOALS: Target date: 07/14/2022  I with final HEP Baseline:  Goal status: INITIAL  2.  5 x STS in < 15 seconds Baseline: 41 Goal status: INITIAL  3.  Increase  RLE strength to at least 4-/5 throughout Baseline: 2/5 Goal status: INITIAL  4.  Paitent will ambulate x at least 400' with LRAD, MI, minimized gait deviations, functional speed. Baseline:  Goal status: INITIAL  5.  Patient will score at least 40/56 on BERG balance assessment. Baseline: 31/56 in hospital, 8/8-37 Goal status: ongoing  6.  Paitent will complete TUG in < 15 sec with  LRAD. Baseline: 23.34 Goal status: ongoing   ASSESSMENT:  CLINICAL IMPRESSION: PAtient really has difficulty with any movements actively with the right LE, she does assist with AAROM but tends to fight the movements, had to be creative on getting her to move.   OBJECTIVE IMPAIRMENTS Abnormal gait, decreased activity tolerance, decreased balance, decreased coordination, decreased endurance, decreased mobility, difficulty walking, decreased strength, impaired UE functional use, improper body mechanics, postural dysfunction, and pain.   ACTIVITY LIMITATIONS carrying, lifting, bending, standing, squatting, sleeping, stairs, transfers, bathing, toileting, dressing, reach over head, hygiene/grooming, locomotion level, and caring for others  PARTICIPATION LIMITATIONS: meal prep, cleaning, laundry, shopping, and community activity  PERSONAL FACTORS Age are also affecting patient's functional outcome.   REHAB POTENTIAL: Good  CLINICAL DECISION MAKING: Evolving/moderate complexity  EVALUATION COMPLEXITY: Moderate  PLAN: PT FREQUENCY: 2x/week  PT DURATION: 12 weeks  PLANNED INTERVENTIONS: Therapeutic exercises, Therapeutic activity, Neuromuscular re-education, Balance training, Gait training, Patient/Family education, Self Care, Joint mobilization, Stair training, Ionotophoresis 4mg /ml Dexamethasone, and Manual therapy  PLAN FOR NEXT SESSION: Progressive NM re-ed for her R LE , PT 04/27/2022, 10:18 AM

## 2022-05-03 ENCOUNTER — Ambulatory Visit: Payer: Medicare Other

## 2022-05-03 DIAGNOSIS — I69351 Hemiplegia and hemiparesis following cerebral infarction affecting right dominant side: Secondary | ICD-10-CM

## 2022-05-03 DIAGNOSIS — M6281 Muscle weakness (generalized): Secondary | ICD-10-CM

## 2022-05-03 DIAGNOSIS — R262 Difficulty in walking, not elsewhere classified: Secondary | ICD-10-CM

## 2022-05-03 DIAGNOSIS — R2681 Unsteadiness on feet: Secondary | ICD-10-CM

## 2022-05-03 DIAGNOSIS — R278 Other lack of coordination: Secondary | ICD-10-CM

## 2022-05-03 NOTE — Therapy (Signed)
OUTPATIENT PHYSICAL THERAPY NEURO TREATMENT   Patient Name: April Hunt MRN: [id number] DOB:12/13/1950, 71 y.o., female Today's Date: 05/03/2022   PCP: [name], [name] REFERRING PROVIDER: Dimple Nanas, MD    PT End of Session - 05/03/22 1406     Visit Number 4    Date for PT Re-Evaluation 07/14/22    PT Start Time 1400    PT Stop Time 1445    PT Time Calculation (min) 45 min    Equipment Utilized During Treatment Gait belt    Activity Tolerance Patient tolerated treatment well    Behavior During Therapy WFL for tasks assessed/performed               Past Medical History:  Diagnosis Date   Allergy    seasonal   Arthritis    Asthma 12/25/2011   Asthma 12/25/2011   Formatting of this note might be different from the original. Last Assessment & Plan:  Currently stable with PRN albuterol.   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   Pure hypercholesterolemia    Refusal of blood transfusions as patient is Jehovah's Witness    Sinusitis 03/04/2013   Tinea pedis 03/19/2012   Tinea versicolor 03/19/2012   Past Surgical History:  Procedure Laterality  Date   ABDOMINAL HYSTERECTOMY  1998   Still has cervix and both ovaries   APPENDECTOMY     BACK SURGERY     JOINT REPLACEMENT     scope bil knees   KNEE ARTHROSCOPY  1997   right knee   Lymph node resection  1999   benign   MENISCUS REPAIR  1995   left knee   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   DR CAFFREY   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   Procedure: RIGHT TOTAL KNEE ARTHROPLASTY;  Surgeon: Thera Flake., MD;  Location: [facility] OR;  Service: Orthopedics;  Laterality: Right;   Patient Active Problem List   Diagnosis Date Noted   Acute stroke due to ischemia (HCC) 04/03/2022   Lumbar degenerative disc disease 03/07/2022   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   Post-menopause atrophic vaginitis 02/08/2017   GERD without esophagitis 03/07/2016   Essential hypertension 11/16/2014   Osteoarthritis of right knee 12/26/2013   Mild intermittent asthma without complication 12/25/2011    ONSET DATE: 04/05/2022   REFERRING DIAG: Diagnosis R29.90 (ICD-10-CM) - Stroke-like symptoms   THERAPY DIAG:  Hemiplegia and hemiparesis following cerebral infarction affecting right dominant side (HCC)  Other lack of coordination  Muscle weakness (generalized)  Unsteadiness on feet  Difficulty  in walking, not elsewhere classified  Rationale for Evaluation and Treatment Rehabilitation  SUBJECTIVE:                                                                                                                                                                                              SUBJECTIVE STATEMENT: Still having numbness in the R leg, some stumbling but no falls.    Pt accompanied by: family member  PERTINENT HISTORY: 71 year old Jehovah's Witness comes to the hospital with complaints of GERD, HTN, HLD, asthma, history of lumbar radiculopathy status post laminectomy/fusion came to [facility] with complains of right facial numbness and tingling along with right lower  extremity weakness.  Neurology recommended admitting patient for stroke work-up.  CT of the head was negative.  She was started on aspirin and Plavix.  Patient was seen by neurology team.  A1c 5.4, UDS negative.  MRI brain was also negative, LDL was 155.  Right-sided facial symptoms resolved but right lower extremity weakness which persisted therefore lumbar spine was ordered.  MRI spine showed disc bulge with slight neurocompression.  Case was discussed with Dr. Franky Macho from neurosurgery who recommended outpatient follow-up in 7 days of Decadron otherwise no acute surgical needs at this time. Later in the day had extensive discussion with patient regarding her care and her sister was present at bedside.  I explained to patient that she will need to follow-up outpatient with pain management for her lower back and right lower extremity pain and weakness, take 7 days of oral Decadron and get outpatient neurosurgery referral by her PCP. She also tells me that she tells me that she is not allergic to sulfa tells me that she is not allergic to aspirin as long as enteric-coated therefore willing to take aspirin and Plavix for 3 weeks followed by aspirin.  PAIN:  Are you having pain? Yes: NPRS scale: 4/10 Pain location: whole R side Pain description: cramping Aggravating factors: end of day Relieving factors: rest  PRECAUTIONS: None  WEIGHT BEARING RESTRICTIONS No  FALLS: Has patient fallen in last 6 months? No  LIVING ENVIRONMENT: Lives with: lives with their family Lives in: House/apartment Stairs: Yes: Internal: 12 steps; on right going up Has following equipment at home: Single point cane and Walker - 4 wheeled  PLOF: Independent  PATIENT GOALS Walk on her own, return to her normal routine.  OBJECTIVE:   DIAGNOSTIC FINDINGS: -CT head, MRI brain is negative for acute pathology.  CTA head and neck is also negative.  A1c 5.4, UDS negative, LDL 155.  Suspicion still remains for TIA.  A.  A.  Neurology recommending aspirin Plavix for 3 weeks followed by aspirin. -MRI lumbar spine showed bulging of the disc with nerve root compression.  Discussed with Dr Franky Macho who reviewed her MRI- no surgical indication, ok for week long decadron and outptn follow up.  She follows at [facility] pain management for monthly epidural bupivacaine/Kenalog injections. --Echocardiogram normal ef  COGNITION: Overall cognitive status: Within functional limits for tasks assessed   SENSATION: Patient reports numbness on R face, arm and leg  COORDINATION: Mildly slow fingers to thumb   MUSCLE TONE: RLE: Within functional limits   MUSCLE LENGTH: Hamstrings: Right 50 deg; Left 80 deg  POSTURE: No Significant postural limitations  LOWER EXTREMITY ROM:    PROM grossly WFL, R HS tightness noted.  LOWER EXTREMITY MMT:  LLE WNL  MMT Right Eval   Hip flexion 2   Hip extension 2   Hip abduction 2   Hip adduction    Hip internal rotation    Hip external rotation    Knee flexion 2   Knee extension 2   Ankle dorsiflexion 2+   Ankle plantarflexion    Ankle inversion    Ankle eversion    (Blank rows = not tested)  BED MOBILITY: I   TRANSFERS: Assistive device utilized: Environmental consultant - 4 wheeled  Sit to stand: Modified independence Stand to sit: Modified independence Chair to chair: Modified independence  STAIRS:  Level of Assistance: Modified independence  Stair Negotiation Technique: Step to Pattern with Bilateral Rails  Number of Stairs: 12   Height of Stairs: 6  Comments: Per patient report, to access her home.  GAIT: Gait pattern: step to pattern, decreased arm swing- Right, decreased arm swing- Left, decreased step length- Right, decreased stance time- Right, Right hip hike, trendelenburg, lateral hip instability, and trunk flexed Distance walked: 47' Assistive device utilized: Environmental consultant - 4 wheeled Level of assistance: Modified independence Comments: Patient moves very slow, approximately  2 minutes to travel 55'  FUNCTIONAL TESTs:  5 times sit to stand: 41.08 Timed up and go (TUG): TBD Berg Balance Scale: TBD (31 in inpatient)  TODAY'S TREATMENT:  05/03/22 Nustep L3 x76mins  STS w/red ball push out, CGA 2x5 Calf raises holding on to rollator 2x10  Step ups 4" up with L, difficulty picking up R   Walking with weighted shopping cart  Side steps with [facility]   04/27/22 Nustep level 3 x 6 minutes Ball b/n knees squeeze LAQ on the left 2.5#, AAROM LAQ on the right Right ankle red tband PF, AAROM DF right ankle Marching on the left 2.5#, right AAROM Stairs 6" and 4" up and down with CGA, one at a time Supine feet on ball K2C, trunk rotation, small bridges, isometric abs SAQ with left leg assist 3x5  04/25/22 NuStep at L2 x 5 minutes, required Vc and TC to release LLE when attempting to push with R TUG-23.34 BERG 37 Seated knee flexion and ext with R foot on towel to decrease friction-very slow and labored in both directions, x 3 Seated heel raise/toe raise Standing stepping forward and back. Sit to/from stand x 5   Education   PATIENT EDUCATION: Education details: POC, verbal HEP Person educated: Patient and sister Education method: Medical illustrator Education comprehension: verbalized understanding and returned demonstration   HOME EXERCISE PROGRAM: Verbal-hip flexion, knee ext, ankle DF/PF-all in sitting. Consciously use RLE in all activities as much as possible.  [id number]    GOALS: Goals reviewed with patient? Yes  SHORT TERM GOALS: Target  date: 05/23/2022  I with basic HEP Baseline: Goal status: ongoing  2.  5x STS in < 24 seconds Baseline: 41 Goal status: ongoing LONG TERM GOALS: Target date: 07/14/2022  I with final HEP Baseline:  Goal status: INITIAL  2.  5 x STS in < 15 seconds Baseline: 41 Goal status: INITIAL  3.  Increase RLE strength to at least 4-/5 throughout Baseline: 2/5 Goal status: INITIAL  4.  Paitent will  ambulate x at least 400' with LRAD, MI, minimized gait deviations, functional speed. Baseline:  Goal status: INITIAL  5.  Patient will score at least 40/56 on BERG balance assessment. Baseline: 31/56 in hospital, 8/8-37 Goal status: ongoing  6.  Paitent will complete TUG in < 15 sec with LRAD. Baseline: 23.34 Goal status: ongoing   ASSESSMENT:  CLINICAL IMPRESSION: Worked on LE strengthening and functional training with STS and step ups. Patient still has a lot of difficulty moving her RLE, especially with isolated movements. She drags her R foot when pushing shopping cart with 20# kettle bell, states it might be because she is tired. Does well with side stepping holding on to therapist, requires cues to pick up feet and not drag them.    OBJECTIVE IMPAIRMENTS Abnormal gait, decreased activity tolerance, decreased balance, decreased coordination, decreased endurance, decreased mobility, difficulty walking, decreased strength, impaired UE functional use, improper body mechanics, postural dysfunction, and pain.   ACTIVITY LIMITATIONS carrying, lifting, bending, standing, squatting, sleeping, stairs, transfers, bathing, toileting, dressing, reach over head, hygiene/grooming, locomotion level, and caring for others  PARTICIPATION LIMITATIONS: meal prep, cleaning, laundry, shopping, and community activity  PERSONAL FACTORS Age are also affecting patient's functional outcome.   REHAB POTENTIAL: Good  CLINICAL DECISION MAKING: Evolving/moderate complexity  EVALUATION COMPLEXITY: Moderate  PLAN: PT FREQUENCY: 2x/week  PT DURATION: 12 weeks  PLANNED INTERVENTIONS: Therapeutic exercises, Therapeutic activity, Neuromuscular re-education, Balance training, Gait training, Patient/Family education, Self Care, Joint mobilization, Stair training, Ionotophoresis 4mg /ml Dexamethasone, and Manual therapy  PLAN FOR NEXT SESSION: Progressive NM re-ed for her R LE, more walking with cart, cone  taps, balance on airex   , DPT 05/03/2022, 2:44 PM

## 2022-05-05 ENCOUNTER — Encounter: Payer: Self-pay | Admitting: Physical Therapy

## 2022-05-05 ENCOUNTER — Ambulatory Visit: Payer: Medicare Other | Admitting: Physical Therapy

## 2022-05-05 DIAGNOSIS — R262 Difficulty in walking, not elsewhere classified: Secondary | ICD-10-CM

## 2022-05-05 DIAGNOSIS — R2681 Unsteadiness on feet: Secondary | ICD-10-CM

## 2022-05-05 DIAGNOSIS — R278 Other lack of coordination: Secondary | ICD-10-CM

## 2022-05-05 DIAGNOSIS — I69351 Hemiplegia and hemiparesis following cerebral infarction affecting right dominant side: Secondary | ICD-10-CM | POA: Diagnosis not present

## 2022-05-05 DIAGNOSIS — M6281 Muscle weakness (generalized): Secondary | ICD-10-CM

## 2022-05-05 NOTE — Therapy (Signed)
OUTPATIENT PHYSICAL THERAPY NEURO TREATMENT   Patient Name: April Hunt MRN: [id number] DOB:01/12/1951, 71 y.o., female Today's Date: 05/05/2022   PCP: [name], [name] REFERRING PROVIDER: Dimple Nanas, MD    PT End of Session - 05/05/22 0803     Visit Number 5    Date for PT Re-Evaluation 07/14/22    PT Start Time 0758    PT Stop Time 0840    PT Time Calculation (min) 42 min    Equipment Utilized During Treatment Gait belt    Activity Tolerance Patient tolerated treatment well    Behavior During Therapy [facility] for tasks assessed/performed                Past Medical History:  Diagnosis Date   Allergy    seasonal   Arthritis    Asthma 12/25/2011   Asthma 12/25/2011   Formatting of this note might be different from the original. Last Assessment & Plan:  Currently stable with PRN albuterol.   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   Pure hypercholesterolemia    Refusal of blood transfusions as patient is Jehovah's Witness    Sinusitis 03/04/2013   Tinea pedis 03/19/2012   Tinea versicolor 03/19/2012   Past Surgical History:  Procedure Laterality  Date   ABDOMINAL HYSTERECTOMY  1998   Still has cervix and both ovaries   APPENDECTOMY     BACK SURGERY     JOINT REPLACEMENT     scope bil knees   KNEE ARTHROSCOPY  1997   right knee   Lymph node resection  1999   benign   MENISCUS REPAIR  1995   left knee   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   DR CAFFREY   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   Procedure: RIGHT TOTAL KNEE ARTHROPLASTY;  Surgeon: Thera Flake., MD;  Location: [facility] OR;  Service: Orthopedics;  Laterality: Right;   Patient Active Problem List   Diagnosis Date Noted   Acute stroke due to ischemia (HCC) 04/03/2022   Lumbar degenerative disc disease 03/07/2022   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   Post-menopause atrophic vaginitis 02/08/2017   GERD without esophagitis 03/07/2016   Essential hypertension 11/16/2014   Osteoarthritis of right knee 12/26/2013   Mild intermittent asthma without complication 12/25/2011    ONSET DATE: 04/05/2022   REFERRING DIAG: Diagnosis R29.90 (ICD-10-CM) - Stroke-like symptoms   THERAPY DIAG:  Hemiplegia and hemiparesis following cerebral infarction affecting right dominant side (HCC)  Other lack of coordination  Muscle weakness (generalized)  Unsteadiness on feet  Difficulty in walking, not elsewhere classified  Rationale for Evaluation and Treatment Rehabilitation  SUBJECTIVE:                                                                                                                                                                                              SUBJECTIVE STATEMENT: Patient reports she is having some difficulty sleeping due to pain in her R leg and foot.    Pt accompanied by: family member  PERTINENT HISTORY: 71 year old Jehovah's Witness comes to the hospital with complaints of GERD, HTN, HLD, asthma, history of lumbar radiculopathy status post laminectomy/fusion came to [facility] with complains of right facial numbness and tingling  along with right lower extremity weakness.  Neurology recommended admitting patient for stroke work-up.  CT of the head was negative.  She was started on aspirin and Plavix.  Patient was seen by neurology team.  A1c 5.4, UDS negative.  MRI brain was also negative, LDL was 155.  Right-sided facial symptoms resolved but right lower extremity weakness which persisted therefore lumbar spine was ordered.  MRI spine showed disc bulge with slight neurocompression.  Case was discussed with Dr. Franky Macho from neurosurgery who recommended outpatient follow-up in 7 days of Decadron otherwise no acute surgical needs at this time. Later in the day had extensive discussion with patient regarding her care and her sister was present at bedside.  I explained to patient that she will need to follow-up outpatient with pain management for her lower back and right lower extremity pain and weakness, take 7 days of oral Decadron and get outpatient neurosurgery referral by her PCP. She also tells me that she tells me that she is not allergic to sulfa tells me that she is not allergic to aspirin as long as enteric-coated therefore willing to take aspirin and Plavix for 3 weeks followed by aspirin.  PAIN:  Are you having pain? Yes: NPRS scale: 4/10 Pain location: whole R side Pain description: cramping Aggravating factors: end of day Relieving factors: rest  PRECAUTIONS: None  WEIGHT BEARING RESTRICTIONS No  FALLS: Has patient fallen in last 6 months? No  LIVING ENVIRONMENT: Lives with: lives with their family Lives in: House/apartment Stairs: Yes: Internal: 12 steps; on right going up Has following equipment at home: Single point cane and Walker - 4 wheeled  PLOF: Independent  PATIENT GOALS Walk on her own, return to her normal routine.  OBJECTIVE:   DIAGNOSTIC FINDINGS: -CT head, MRI brain is negative for acute pathology.  CTA head and neck is also negative.  A1c 5.4, UDS negative, LDL 155.  Suspicion still  remains for  TIA.  A.  A.  Neurology recommending aspirin Plavix for 3 weeks followed by aspirin. -MRI lumbar spine showed bulging of the disc with nerve root compression.  Discussed with Dr Franky Macho who reviewed her MRI- no surgical indication, ok for week long decadron and outptn follow up.  She follows at [facility] pain management for monthly epidural bupivacaine/Kenalog injections. --Echocardiogram normal ef  COGNITION: Overall cognitive status: Within functional limits for tasks assessed   SENSATION: Patient reports numbness on R face, arm and leg  COORDINATION: Mildly slow fingers to thumb   MUSCLE TONE: RLE: Within functional limits   MUSCLE LENGTH: Hamstrings: Right 50 deg; Left 80 deg  POSTURE: No Significant postural limitations  LOWER EXTREMITY ROM:    PROM grossly WFL, R HS tightness noted.  LOWER EXTREMITY MMT:  LLE WNL  MMT Right Eval   Hip flexion 2   Hip extension 2   Hip abduction 2   Hip adduction    Hip internal rotation    Hip external rotation    Knee flexion 2   Knee extension 2   Ankle dorsiflexion 2+   Ankle plantarflexion    Ankle inversion    Ankle eversion    (Blank rows = not tested)  BED MOBILITY: I   TRANSFERS: Assistive device utilized: Environmental consultant - 4 wheeled  Sit to stand: Modified independence Stand to sit: Modified independence Chair to chair: Modified independence  STAIRS:  Level of Assistance: Modified independence  Stair Negotiation Technique: Step to Pattern with Bilateral Rails  Number of Stairs: 12   Height of Stairs: 6  Comments: Per patient report, to access her home.  GAIT: Gait pattern: step to pattern, decreased arm swing- Right, decreased arm swing- Left, decreased step length- Right, decreased stance time- Right, Right hip hike, trendelenburg, lateral hip instability, and trunk flexed Distance walked: 18' Assistive device utilized: Environmental consultant - 4 wheeled Level of assistance: Modified independence Comments: Patient moves  very slow, approximately 2 minutes to travel 49'  FUNCTIONAL TESTs:  5 times sit to stand: 41.08 Timed up and go (TUG): TBD Berg Balance Scale: TBD (31 in inpatient)  TODAY'S TREATMENT:  05/05/22 NuStep L3 x 6 minutes Supine-RLE press into red physio ball while lifting LLE. Required mod facilitation initially, improved to light min Standing-Step back and out with LLE, turn back and reach with LUE to touch the target behind. 8 times to L and 5 times to R. Required increased A turning to R and mod facilitation to weight shift onto the leg once turned. Gait training with shopping cart, 1 x 200', patient initally with more normalized gait pattern, but as she progressed she started dragging R foot more. Standing at 4" step, place R foot on step. Step ups with BUE support. Improved in her R activation and use with repetition.  Placed L foot on step, R knee flex/ext.  05/03/22 Nustep L3 x71mins  STS w/red ball push out, CGA 2x5 Calf raises holding on to rollator 2x10  Step ups 4" up with L, difficulty picking up R   Walking with weighted shopping cart  Side steps with [facility]   04/27/22 Nustep level 3 x 6 minutes Ball b/n knees squeeze LAQ on the left 2.5#, AAROM LAQ on the right Right ankle red tband PF, AAROM DF right ankle Marching on the left 2.5#, right AAROM Stairs 6" and 4" up and down with CGA, one at a time Supine feet on ball K2C, trunk rotation, small bridges, isometric abs SAQ with left  leg assist 3x5  04/25/22 NuStep at L2 x 5 minutes, required Vc and TC to release LLE when attempting to push with R TUG-23.34 BERG 37 Seated knee flexion and ext with R foot on towel to decrease friction-very slow and labored in both directions, x 3 Seated heel raise/toe raise Standing stepping forward and back. Sit to/from stand x 5   Education   PATIENT EDUCATION: Education details: POC, verbal HEP Person educated: Patient and sister Education method: Software engineer Education comprehension: verbalized understanding and returned demonstration   HOME EXERCISE PROGRAM: Verbal-hip flexion, knee ext, ankle DF/PF-all in sitting. Consciously use RLE in all activities as much as possible.  [id number]    GOALS: Goals reviewed with patient? Yes  SHORT TERM GOALS: Target date: 05/23/2022  I with basic HEP Baseline: Goal status: ongoing  2.  5x STS in < 24 seconds Baseline: 41 Goal status: ongoing LONG TERM GOALS: Target date: 07/14/2022  I with final HEP Baseline:  Goal status: INITIAL  2.  5 x STS in < 15 seconds Baseline: 41 Goal status: INITIAL  3.  Increase RLE strength to at least 4-/5 throughout Baseline: 2/5 Goal status: INITIAL  4.  Paitent will ambulate x at least 400' with LRAD, MI, minimized gait deviations, functional speed. Baseline:  Goal status: INITIAL  5.  Patient will score at least 40/56 on BERG balance assessment. Baseline: 31/56 in hospital, 8/8-37 Goal status: ongoing  6.  Paitent will complete TUG in < 15 sec with LRAD. Baseline: 23.34 Goal status: ongoing   ASSESSMENT:  CLINICAL IMPRESSION: Patient reports no changes. Treatment emphasized activation of RLE. Her ability fluctuates at times, but overall improving.   OBJECTIVE IMPAIRMENTS Abnormal gait, decreased activity tolerance, decreased balance, decreased coordination, decreased endurance, decreased mobility, difficulty walking, decreased strength, impaired UE functional use, improper body mechanics, postural dysfunction, and pain.   ACTIVITY LIMITATIONS carrying, lifting, bending, standing, squatting, sleeping, stairs, transfers, bathing, toileting, dressing, reach over head, hygiene/grooming, locomotion level, and caring for others  PARTICIPATION LIMITATIONS: meal prep, cleaning, laundry, shopping, and community activity  PERSONAL FACTORS Age are also affecting patient's functional outcome.   REHAB POTENTIAL: Good  CLINICAL DECISION  MAKING: Evolving/moderate complexity  EVALUATION COMPLEXITY: Moderate  PLAN: PT FREQUENCY: 2x/week  PT DURATION: 12 weeks  PLANNED INTERVENTIONS: Therapeutic exercises, Therapeutic activity, Neuromuscular re-education, Balance training, Gait training, Patient/Family education, Self Care, Joint mobilization, Stair training, Ionotophoresis 4mg /ml Dexamethasone, and Manual therapy  PLAN FOR NEXT SESSION: Progressive NM re-ed for her R LE, more walking with cart, cone taps, balance on airex   DPT 05/05/22 8:46 AM  05/05/2022, 8:46 AM

## 2022-05-10 ENCOUNTER — Ambulatory Visit: Payer: Medicare Other | Admitting: Physical Therapy

## 2022-05-10 ENCOUNTER — Encounter: Payer: Self-pay | Admitting: Physical Therapy

## 2022-05-10 DIAGNOSIS — M6281 Muscle weakness (generalized): Secondary | ICD-10-CM

## 2022-05-10 DIAGNOSIS — R2681 Unsteadiness on feet: Secondary | ICD-10-CM

## 2022-05-10 DIAGNOSIS — R262 Difficulty in walking, not elsewhere classified: Secondary | ICD-10-CM

## 2022-05-10 DIAGNOSIS — R278 Other lack of coordination: Secondary | ICD-10-CM

## 2022-05-10 DIAGNOSIS — I69351 Hemiplegia and hemiparesis following cerebral infarction affecting right dominant side: Secondary | ICD-10-CM | POA: Diagnosis not present

## 2022-05-10 NOTE — Therapy (Signed)
OUTPATIENT PHYSICAL THERAPY NEURO TREATMENT   Patient Name: April Hunt MRN: [id number] DOB:1950/12/30, 71 y.o., female Today's Date: 05/10/2022   PCP: [name] REFERRING PROVIDER: Dimple Nanas, MD    PT End of Session - 05/10/22 1559     Visit Number 6    Date for PT Re-Evaluation 07/14/22    PT Start Time 1530    PT Stop Time 1613    PT Time Calculation (min) 43 min    Activity Tolerance Patient tolerated treatment well    Behavior During Therapy [facility] for tasks assessed/performed                 Past Medical History:  Diagnosis Date   Allergy    seasonal   Arthritis    Asthma 12/25/2011   Asthma 12/25/2011   Formatting of this note might be different from the original. Last Assessment & Plan:  Currently stable with PRN albuterol.   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   Pure hypercholesterolemia    Refusal of blood transfusions as patient is Jehovah's Witness    Sinusitis 03/04/2013   Tinea pedis 03/19/2012   Tinea versicolor 03/19/2012   Past Surgical History:  Procedure Laterality Date   ABDOMINAL HYSTERECTOMY  1998   Still has  cervix and both ovaries   APPENDECTOMY     BACK SURGERY     JOINT REPLACEMENT     scope bil knees   KNEE ARTHROSCOPY  1997   right knee   Lymph node resection  1999   benign   MENISCUS REPAIR  1995   left knee   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   DR CAFFREY   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   Procedure: RIGHT TOTAL KNEE ARTHROPLASTY;  Surgeon: Thera Flake., MD;  Location: [facility] OR;  Service: Orthopedics;  Laterality: Right;   Patient Active Problem List   Diagnosis Date Noted   Acute stroke due to ischemia (HCC) 04/03/2022   Lumbar degenerative disc disease 03/07/2022   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   Post-menopause atrophic vaginitis 02/08/2017   GERD without esophagitis 03/07/2016   Essential hypertension 11/16/2014   Osteoarthritis of right knee 12/26/2013   Mild intermittent asthma without complication 12/25/2011    ONSET DATE: 04/05/2022   REFERRING DIAG: Diagnosis R29.90 (ICD-10-CM) - Stroke-like symptoms   THERAPY DIAG:  Hemiplegia and hemiparesis following cerebral infarction affecting right dominant side (HCC)  Other lack of coordination  Muscle weakness (generalized)  Unsteadiness on feet  Difficulty in walking, not elsewhere classified  Rationale  for Evaluation and Treatment Rehabilitation  SUBJECTIVE:                                                                                                                                                                                              SUBJECTIVE STATEMENT:  My right leg is a beast at night, but its getting better. Would like to still work on strengthening R LE.    Pt accompanied by: family member  PERTINENT HISTORY: 71 year old Jehovah's Witness comes to the hospital with complaints of GERD, HTN, HLD, asthma, history of lumbar radiculopathy status post laminectomy/fusion [facility] with complains of right facial numbness and tingling along with right lower extremity  weakness.  Neurology recommended admitting patient for stroke work-up.  CT of the head was negative.  She was started on aspirin and Plavix.  Patient was seen by neurology team.  A1c 5.4, UDS negative.  MRI brain was also negative, LDL was 155.  Right-sided facial symptoms resolved but right lower extremity weakness which persisted therefore lumbar spine was ordered.  MRI spine showed disc bulge with slight neurocompression.  Case was discussed with Dr. Franky Machoabbell from neurosurgery who recommended outpatient follow-up in 7 days of Decadron otherwise no acute surgical needs at this time. Later in the day had extensive discussion with patient regarding her care and her sister was present at bedside.  I explained to patient that she will need to follow-up outpatient with pain management for her lower back and right lower extremity pain and weakness, take 7 days of oral Decadron and get outpatient neurosurgery referral by her PCP. She also tells me that she tells me that she is not allergic to sulfa tells me that she is not allergic to aspirin as long as enteric-coated therefore willing to take aspirin and Plavix for 3 weeks followed by aspirin.  PAIN:  Are you having pain? Yes: NPRS scale: 5/10 Pain location: whole R side Pain description: cramping Aggravating factors: end of day Relieving factors: rest  PRECAUTIONS: None  WEIGHT BEARING RESTRICTIONS No  FALLS: Has patient fallen in last 6 months? No  LIVING ENVIRONMENT: Lives with: lives with their family Lives in: House/apartment Stairs: Yes: Internal: 12 steps; on right going up Has following equipment at home: Single point cane and Walker - 4 wheeled  PLOF: Independent  PATIENT GOALS Walk on her own, return to her normal routine.  OBJECTIVE:   DIAGNOSTIC FINDINGS: -CT head, MRI brain is negative for acute pathology.  CTA head and neck is also negative.  A1c 5.4, UDS negative, LDL 155.  Suspicion still remains for TIA.  A.  A.  Neurology  recommending aspirin Plavix for 3 weeks followed by aspirin. -MRI lumbar spine showed bulging of the disc with nerve root compression.  Discussed with Dr Franky Macho who reviewed her MRI- no surgical indication, ok for week long decadron and outptn follow up.  She follows at [facility] pain management for monthly epidural bupivacaine/Kenalog injections. --Echocardiogram normal ef  COGNITION: Overall cognitive status: Within functional limits for tasks assessed   SENSATION: Patient reports numbness on R face, arm and leg  COORDINATION: Mildly slow fingers to thumb   MUSCLE TONE: RLE: Within functional limits   MUSCLE LENGTH: Hamstrings: Right 50 deg; Left 80 deg  POSTURE: No Significant postural limitations  LOWER EXTREMITY ROM:    PROM grossly WFL, R HS tightness noted.  LOWER EXTREMITY MMT:  LLE WNL  MMT Right Eval   Hip flexion 2   Hip extension 2   Hip abduction 2   Hip adduction    Hip internal rotation    Hip external rotation    Knee flexion 2   Knee extension 2   Ankle dorsiflexion 2+   Ankle plantarflexion    Ankle inversion    Ankle eversion    (Blank rows = not tested)  BED MOBILITY: I   TRANSFERS: Assistive device utilized: Environmental consultant - 4 wheeled  Sit to stand: Modified independence Stand to sit: Modified independence Chair to chair: Modified independence  STAIRS:  Level of Assistance: Modified independence  Stair Negotiation Technique: Step to Pattern with Bilateral Rails  Number of Stairs: 12   Height of Stairs: 6  Comments: Per patient report, to access her home.  GAIT: Gait pattern: step to pattern, decreased arm swing- Right, decreased arm swing- Left, decreased step length- Right, decreased stance time- Right, Right hip hike, trendelenburg, lateral hip instability, and trunk flexed Distance walked: 62' Assistive device utilized: Environmental consultant - 4 wheeled Level of assistance: Modified independence Comments: Patient moves very slow, approximately 2 minutes  to travel 52'  FUNCTIONAL TESTs:  5 times sit to stand: 41.08 Timed up and go (TUG): TBD Berg Balance Scale: TBD (31 in inpatient)  TODAY'S TREATMENT:   05/10/22  Nustep L1-> progressing to L4 x5 minutes R LE only (unable to push with R LE when both legs were on the machine, but able to perform with R LE alone on Nustep)  -Step up onto 4 inch step with BUEs x5 R Mod verbal cues for lifting R LE high enough  -Toe taps on 8 inch step with L LE while maintaining stance R LE U UE support x10  -Standing hip ABD x10 BLEs  - LAQs 3# 1x10 L LE unable R LE even with 0# - seated mini-marches R LE 0#  1x10 - attempted tandem stance in bars- declined to perform without U UE support  - B heel and toe raises BUE support x10 each  - gait training in // bars with U UE progressing to one finger x2 full laps  - side steps in // bars 2 full laps two finger support       05/05/22 NuStep L3 x 6 minutes Supine-RLE press into red physio ball while lifting LLE. Required mod facilitation initially, improved to light min Standing-Step back and out with LLE, turn back and reach with LUE to touch the target behind. 8 times to L and 5 times to R. Required increased A turning to R and mod facilitation to weight shift onto the leg once turned. Gait training with shopping cart, 1 x  200', patient initally with more normalized gait pattern, but as she progressed she started dragging R foot more. Standing at 4" step, place R foot on step. Step ups with BUE support. Improved in her R activation and use with repetition.  Placed L foot on step, R knee flex/ext.  05/03/22 Nustep L3 x51mins  STS w/red ball push out, CGA 2x5 Calf raises holding on to rollator 2x10  Step ups 4" up with L, difficulty picking up R   Walking with weighted shopping cart  Side steps with [facility]   04/27/22 Nustep level 3 x 6 minutes Ball b/n knees squeeze LAQ on the left 2.5#, AAROM LAQ on the right Right ankle red tband PF, AAROM DF  right ankle Marching on the left 2.5#, right AAROM Stairs 6" and 4" up and down with CGA, one at a time Supine feet on ball K2C, trunk rotation, small bridges, isometric abs SAQ with left leg assist 3x5  04/25/22 NuStep at L2 x 5 minutes, required Vc and TC to release LLE when attempting to push with R [id number] BERG 37 Seated knee flexion and ext with R foot on towel to decrease friction-very slow and labored in both directions, x 3 Seated heel raise/toe raise Standing stepping forward and back. Sit to/from stand x 5   Education   PATIENT EDUCATION: Education details: POC, verbal HEP Person educated: Patient and sister Education method: Medical illustrator Education comprehension: verbalized understanding and returned demonstration   HOME EXERCISE PROGRAM: Verbal-hip flexion, knee ext, ankle DF/PF-all in sitting. Consciously use RLE in all activities as much as possible.  [id number]    GOALS: Goals reviewed with patient? Yes  SHORT TERM GOALS: Target date: 05/23/2022  I with basic HEP Baseline: Goal status: ongoing  2.  5x STS in < 24 seconds Baseline: 41 Goal status: ongoing LONG TERM GOALS: Target date: 07/14/2022  I with final HEP Baseline:  Goal status: INITIAL  2.  5 x STS in < 15 seconds Baseline: 41 Goal status: INITIAL  3.  Increase RLE strength to at least 4-/5 throughout Baseline: 2/5 Goal status: INITIAL  4.  Paitent will ambulate x at least 400' with LRAD, MI, minimized gait deviations, functional speed. Baseline:  Goal status: INITIAL  5.  Patient will score at least 40/56 on BERG balance assessment. Baseline: 31/56 in hospital, 8/8-37 Goal status: ongoing  6.  Paitent will complete TUG in < 15 sec with LRAD. Baseline: 23.34 Goal status: ongoing   ASSESSMENT:  CLINICAL IMPRESSION:  Doing OK, still having a lot of pain at night. We continued to work on functional strength of R LE, as previously noted function has been  fluctuating- R LE seemed a bit weaker today, needed lots of cues to not use UEs to compensate for LE weakness. Unable to perform some strength based tasks she was able to in prior sessions but in some ways also able to progress other tasks today.    OBJECTIVE IMPAIRMENTS Abnormal gait, decreased activity tolerance, decreased balance, decreased coordination, decreased endurance, decreased mobility, difficulty walking, decreased strength, impaired UE functional use, improper body mechanics, postural dysfunction, and pain.   ACTIVITY LIMITATIONS carrying, lifting, bending, standing, squatting, sleeping, stairs, transfers, bathing, toileting, dressing, reach over head, hygiene/grooming, locomotion level, and caring for others  PARTICIPATION LIMITATIONS: meal prep, cleaning, laundry, shopping, and community activity  PERSONAL FACTORS Age are also affecting patient's functional outcome.   REHAB POTENTIAL: Good  CLINICAL DECISION MAKING: Evolving/moderate complexity  EVALUATION COMPLEXITY: Moderate  PLAN:  PT FREQUENCY: 2x/week  PT DURATION: 12 weeks  PLANNED INTERVENTIONS: Therapeutic exercises, Therapeutic activity, Neuromuscular re-education, Balance training, Gait training, Patient/Family education, Self Care, Joint mobilization, Stair training, Ionotophoresis 4mg /ml Dexamethasone, and Manual therapy  PLAN FOR NEXT SESSION: Progressive NM re-ed for her R LE, more walking with cart, cone taps, balance on airex     Lamario Mani U PT DPT PN2  05/10/2022, 4:18 PM

## 2022-05-12 ENCOUNTER — Ambulatory Visit: Payer: Medicare Other | Admitting: Physical Therapy

## 2022-05-12 ENCOUNTER — Encounter: Payer: Self-pay | Admitting: Physical Therapy

## 2022-05-12 DIAGNOSIS — R262 Difficulty in walking, not elsewhere classified: Secondary | ICD-10-CM

## 2022-05-12 DIAGNOSIS — M6281 Muscle weakness (generalized): Secondary | ICD-10-CM

## 2022-05-12 DIAGNOSIS — I69351 Hemiplegia and hemiparesis following cerebral infarction affecting right dominant side: Secondary | ICD-10-CM

## 2022-05-12 DIAGNOSIS — R2681 Unsteadiness on feet: Secondary | ICD-10-CM

## 2022-05-12 DIAGNOSIS — R278 Other lack of coordination: Secondary | ICD-10-CM

## 2022-05-12 NOTE — Therapy (Signed)
OUTPATIENT PHYSICAL THERAPY NEURO TREATMENT   Patient Name: April Hunt MRN: [id number] DOB:1951/03/21, 71 y.o., female Today's Date: 05/12/2022   PCP: [name] [name] REFERRING PROVIDER: Dimple Nanas, MD    PT End of Session - 05/12/22 0951     Visit Number 7    Date for PT Re-Evaluation 07/14/22    PT Start Time 0930    PT Stop Time 1012    PT Time Calculation (min) 42 min    Equipment Utilized During Treatment Gait belt    Activity Tolerance Patient tolerated treatment well    Behavior During Therapy WFL for tasks assessed/performed                  Past Medical History:  Diagnosis Date   Allergy    seasonal   Arthritis    Asthma 12/25/2011   Asthma 12/25/2011   Formatting of this note might be different from the original. Last Assessment & Plan:  Currently stable with PRN albuterol.   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   Pure hypercholesterolemia    Refusal of blood transfusions as patient is Jehovah's Witness    Sinusitis 03/04/2013   Tinea pedis 03/19/2012   Tinea versicolor 03/19/2012   Past Surgical History:  Procedure  Laterality Date   ABDOMINAL HYSTERECTOMY  1998   Still has cervix and both ovaries   APPENDECTOMY     BACK SURGERY     JOINT REPLACEMENT     scope bil knees   KNEE ARTHROSCOPY  1997   right knee   Lymph node resection  1999   benign   MENISCUS REPAIR  1995   left knee   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   DR CAFFREY   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   Procedure: RIGHT TOTAL KNEE ARTHROPLASTY;  Surgeon: Thera Flake., MD;  Location: [facility] OR;  Service: Orthopedics;  Laterality: Right;   Patient Active Problem List   Diagnosis Date Noted   Acute stroke due to ischemia (HCC) 04/03/2022   Lumbar degenerative disc disease 03/07/2022   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   Post-menopause atrophic vaginitis 02/08/2017   GERD without esophagitis 03/07/2016   Essential hypertension 11/16/2014   Osteoarthritis of right knee 12/26/2013   Mild intermittent asthma without complication 12/25/2011    ONSET DATE: 04/05/2022   REFERRING DIAG: Diagnosis R29.90 (ICD-10-CM) - Stroke-like symptoms   THERAPY DIAG:  Hemiplegia and hemiparesis following cerebral infarction affecting right dominant side (HCC)  Other lack of coordination  Muscle weakness (generalized)  Unsteadiness on  feet  Difficulty in walking, not elsewhere classified  Rationale for Evaluation and Treatment Rehabilitation  SUBJECTIVE:                                                                                                                                                                                              SUBJECTIVE STATEMENT: Patient reports increased leg pain after her last treatment. She felt using the weights caused some problems.   Pt accompanied by: family member  PERTINENT HISTORY: 71 year old Jehovah's Witness comes to the hospital with complaints of GERD, HTN, HLD, asthma, history of lumbar radiculopathy status post laminectomy/fusion came to [facility] with complains of right  facial numbness and tingling along with right lower extremity weakness.  Neurology recommended admitting patient for stroke work-up.  CT of the head was negative.  She was started on aspirin and Plavix.  Patient was seen by neurology team.  A1c 5.4, UDS negative.  MRI brain was also negative, LDL was 155.  Right-sided facial symptoms resolved but right lower extremity weakness which persisted therefore lumbar spine was ordered.  MRI spine showed disc bulge with slight neurocompression.  Case was discussed with Dr. Franky Macho from neurosurgery who recommended outpatient follow-up in 7 days of Decadron otherwise no acute surgical needs at this time. Later in the day had extensive discussion with patient regarding her care and her sister was present at bedside.  I explained to patient that she will need to follow-up outpatient with pain management for her lower back and right lower extremity pain and weakness, take 7 days of oral Decadron and get outpatient neurosurgery referral by her PCP. She also tells me that she tells me that she is not allergic to sulfa tells me that she is not allergic to aspirin as long as enteric-coated therefore willing to take aspirin and Plavix for 3 weeks followed by aspirin.  PAIN:  Are you having pain? Yes: NPRS scale: 5/10 Pain location: whole R side Pain description: cramping Aggravating factors: end of day Relieving factors: rest  PRECAUTIONS: None  WEIGHT BEARING RESTRICTIONS No  FALLS: Has patient fallen in last 6 months? No  LIVING ENVIRONMENT: Lives with: lives with their family Lives in: House/apartment Stairs: Yes: Internal: 12 steps; on right going up Has following equipment at home: Single point cane and Walker - 4 wheeled  PLOF: Independent  PATIENT GOALS Walk on her own, return to her normal routine.  OBJECTIVE:   DIAGNOSTIC FINDINGS: -CT head, MRI brain is negative for acute pathology.  CTA head and neck is also negative.  A1c 5.4, UDS negative,  LDL 155.  Suspicion still remains  for TIA.  A.  A.  Neurology recommending aspirin Plavix for 3 weeks followed by aspirin. -MRI lumbar spine showed bulging of the disc with nerve root compression.  Discussed with Dr Franky Macho who reviewed her MRI- no surgical indication, ok for week long decadron and outptn follow up.  She follows at [facility] pain management for monthly epidural bupivacaine/Kenalog injections. --Echocardiogram normal ef  COGNITION: Overall cognitive status: Within functional limits for tasks assessed   SENSATION: Patient reports numbness on R face, arm and leg  COORDINATION: Mildly slow fingers to thumb   MUSCLE TONE: RLE: Within functional limits   MUSCLE LENGTH: Hamstrings: Right 50 deg; Left 80 deg  POSTURE: No Significant postural limitations  LOWER EXTREMITY ROM:    PROM grossly WFL, R HS tightness noted.  LOWER EXTREMITY MMT:  LLE WNL  MMT Right Eval   Hip flexion 2   Hip extension 2   Hip abduction 2   Hip adduction    Hip internal rotation    Hip external rotation    Knee flexion 2   Knee extension 2   Ankle dorsiflexion 2+   Ankle plantarflexion    Ankle inversion    Ankle eversion    (Blank rows = not tested)  BED MOBILITY: I   TRANSFERS: Assistive device utilized: Environmental consultant - 4 wheeled  Sit to stand: Modified independence Stand to sit: Modified independence Chair to chair: Modified independence  STAIRS:  Level of Assistance: Modified independence  Stair Negotiation Technique: Step to Pattern with Bilateral Rails  Number of Stairs: 12   Height of Stairs: 6  Comments: Per patient report, to access her home.  GAIT: Gait pattern: step to pattern, decreased arm swing- Right, decreased arm swing- Left, decreased step length- Right, decreased stance time- Right, Right hip hike, trendelenburg, lateral hip instability, and trunk flexed Distance walked: 11' Assistive device utilized: Environmental consultant - 4 wheeled Level of assistance: Modified  independence Comments: Patient moves very slow, approximately 2 minutes to travel 43'  FUNCTIONAL TESTs:  5 times sit to stand: 41.08 Timed up and go (TUG): TBD Berg Balance Scale: TBD (31 in inpatient)  TODAY'S TREATMENT:  05/12/22 Walking with 2# WATE bar, 2 x 25', CGA, stp through gait. Standing hold 2# WATE Bar- forward lunges x 8 with each leg, then lateral lunges x 8 with each leg. NuStep L3 x 6 minutes, U and LE Paloff press with R side closest to weight 5#, 10 reps. Mini tramp jumping then jump abd/add. Able speed up movements with repetition, 3 x 5 sets with Bue support and CGA with belt.  05/10/22 Nustep L1-> progressing to L4 x5 minutes R LE only (unable to push with R LE when both legs were on the machine, but able to perform with R LE alone on Nustep)  -Step up onto 4 inch step with BUEs x5 R Mod verbal cues for lifting R LE high enough  -Toe taps on 8 inch step with L LE while maintaining stance R LE U UE support x10  -Standing hip ABD x10 BLEs  - LAQs 3# 1x10 L LE unable R LE even with 0# - seated mini-marches R LE 0#  1x10 - attempted tandem stance in bars- declined to perform without U UE support  - B heel and toe raises BUE support x10 each  - gait training in // bars with U UE progressing to one finger x2 full laps  - side steps in // bars 2 full laps two finger support  05/05/22 NuStep L3 x 6 minutes Supine-RLE press into red physio ball while lifting LLE. Required mod facilitation initially, improved to light min Standing-Step back and out with LLE, turn back and reach with LUE to touch the target behind. 8 times to L and 5 times to R. Required increased A turning to R and mod facilitation to weight shift onto the leg once turned. Gait training with shopping cart, 1 x 200', patient initally with more normalized gait pattern, but as she progressed she started dragging R foot more. Standing at 4" step, place R foot on step. Step ups with BUE support. Improved in her  R activation and use with repetition.  Placed L foot on step, R knee flex/ext.  05/03/22 Nustep L3 x85mins  STS w/red ball push out, CGA 2x5 Calf raises holding on to rollator 2x10  Step ups 4" up with L, difficulty picking up R   Walking with weighted shopping cart  Side steps with [facility]   04/27/22 Nustep level 3 x 6 minutes Ball b/n knees squeeze LAQ on the left 2.5#, AAROM LAQ on the right Right ankle red tband PF, AAROM DF right ankle Marching on the left 2.5#, right AAROM Stairs 6" and 4" up and down with CGA, one at a time Supine feet on ball K2C, trunk rotation, small bridges, isometric abs SAQ with left leg assist 3x5  04/25/22 NuStep at L2 x 5 minutes, required Vc and TC to release LLE when attempting to push with R [id number] BERG 37 Seated knee flexion and ext with R foot on towel to decrease friction-very slow and labored in both directions, x 3 Seated heel raise/toe raise Standing stepping forward and back. Sit to/from stand x 5   Education   PATIENT EDUCATION: Education details: POC, verbal HEP Person educated: Patient and sister Education method: Medical illustrator Education comprehension: verbalized understanding and returned demonstration   HOME EXERCISE PROGRAM: Verbal-hip flexion, knee ext, ankle DF/PF-all in sitting. Consciously use RLE in all activities as much as possible.  [id number]    GOALS: Goals reviewed with patient? Yes  SHORT TERM GOALS: Target date: 05/23/2022  I with basic HEP Baseline: Goal status: ongoing  2.  5x STS in < 24 seconds Baseline: 41 Goal status: ongoing LONG TERM GOALS: Target date: 07/14/2022  I with final HEP Baseline:  Goal status: INITIAL  2.  5 x STS in < 15 seconds Baseline: 41 Goal status:ongoing  3.  Increase RLE strength to at least 4-/5 throughout Baseline: 2/5, 3-/5-8/25 Goal status: ongoing  4.  Paitent will ambulate x at least 400' with LRAD, MI, minimized gait deviations, functional  speed. Baseline:  Goal status: INITIAL  5.  Patient will score at least 40/56 on BERG balance assessment. Baseline: 31/56 in hospital, 8/8-37 Goal status: ongoing  6.  Paitent will complete TUG in < 15 sec with LRAD. Baseline: 23.34 Goal status: ongoing   ASSESSMENT:  CLINICAL IMPRESSION: Paitent reports increased pain after last treatment session. She feels using weights caused pain. Treatment focused on functional training to improve NM coordination and activation. She tolerated speed of movement activities well.   OBJECTIVE IMPAIRMENTS Abnormal gait, decreased activity tolerance, decreased balance, decreased coordination, decreased endurance, decreased mobility, difficulty walking, decreased strength, impaired UE functional use, improper body mechanics, postural dysfunction, and pain.   ACTIVITY LIMITATIONS carrying, lifting, bending, standing, squatting, sleeping, stairs, transfers, bathing, toileting, dressing, reach over head, hygiene/grooming, locomotion level, and caring for others  PARTICIPATION LIMITATIONS: meal prep, cleaning, laundry,  shopping, and community activity  PERSONAL FACTORS Age are also affecting patient's functional outcome.   REHAB POTENTIAL: Good  CLINICAL DECISION MAKING: Evolving/moderate complexity  EVALUATION COMPLEXITY: Moderate  PLAN: PT FREQUENCY: 2x/week  PT DURATION: 12 weeks  PLANNED INTERVENTIONS: Therapeutic exercises, Therapeutic activity, Neuromuscular re-education, Balance training, Gait training, Patient/Family education, Self Care, Joint mobilization, Stair training, Ionotophoresis 4mg /ml Dexamethasone, and Manual therapy  PLAN FOR NEXT SESSION: Progressive NM re-ed for her R LE, more walking with cart, cone taps, balance on airex     Oley BalmSusan Maki Sweetser DPT 05/12/22 10:11 AM  05/12/2022, 10:11 AM

## 2022-05-15 ENCOUNTER — Ambulatory Visit: Payer: Medicare Other | Admitting: Physical Therapy

## 2022-05-15 ENCOUNTER — Encounter: Payer: Self-pay | Admitting: Physical Therapy

## 2022-05-15 DIAGNOSIS — R2681 Unsteadiness on feet: Secondary | ICD-10-CM

## 2022-05-15 DIAGNOSIS — I69351 Hemiplegia and hemiparesis following cerebral infarction affecting right dominant side: Secondary | ICD-10-CM

## 2022-05-15 DIAGNOSIS — M6281 Muscle weakness (generalized): Secondary | ICD-10-CM

## 2022-05-15 DIAGNOSIS — R262 Difficulty in walking, not elsewhere classified: Secondary | ICD-10-CM

## 2022-05-15 DIAGNOSIS — R278 Other lack of coordination: Secondary | ICD-10-CM

## 2022-05-15 NOTE — Therapy (Signed)
OUTPATIENT PHYSICAL THERAPY NEURO TREATMENT   Patient Name: April Hunt MRN: [id number] DOB:March 31, 1951, 71 y.o., female Today's Date: 05/15/2022   PCP: [name] [name] REFERRING PROVIDER: Dimple Nanas, MD    PT End of Session - 05/15/22 1249     Visit Number 8    Date for PT Re-Evaluation 07/14/22    PT Start Time 1228    PT Stop Time 1310    PT Time Calculation (min) 42 min    Equipment Utilized During Treatment Gait belt    Activity Tolerance Patient tolerated treatment well    Behavior During Therapy WFL for tasks assessed/performed                   Past Medical History:  Diagnosis Date   Allergy    seasonal   Arthritis    Asthma 12/25/2011   Asthma 12/25/2011   Formatting of this note might be different from the original. Last Assessment & Plan:  Currently stable with PRN albuterol.   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   Pure hypercholesterolemia    Refusal of blood transfusions as patient is Jehovah's Witness    Sinusitis 03/04/2013   Tinea pedis 03/19/2012   Tinea versicolor 03/19/2012   Past Surgical History:  Procedure  Laterality Date   ABDOMINAL HYSTERECTOMY  1998   Still has cervix and both ovaries   APPENDECTOMY     BACK SURGERY     JOINT REPLACEMENT     scope bil knees   KNEE ARTHROSCOPY  1997   right knee   Lymph node resection  1999   benign   MENISCUS REPAIR  1995   left knee   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   DR CAFFREY   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   Procedure: RIGHT TOTAL KNEE ARTHROPLASTY;  Surgeon: Thera Flake., MD;  Location: [facility] OR;  Service: Orthopedics;  Laterality: Right;   Patient Active Problem List   Diagnosis Date Noted   Acute stroke due to ischemia (HCC) 04/03/2022   Lumbar degenerative disc disease 03/07/2022   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   Post-menopause atrophic vaginitis 02/08/2017   GERD without esophagitis 03/07/2016   Essential hypertension 11/16/2014   Osteoarthritis of right knee 12/26/2013   Mild intermittent asthma without complication 12/25/2011    ONSET DATE: 04/05/2022   REFERRING DIAG: Diagnosis R29.90 (ICD-10-CM) - Stroke-like symptoms   THERAPY DIAG:  Hemiplegia and hemiparesis following cerebral infarction affecting right dominant side (HCC)  Other lack of coordination  Muscle weakness (generalized)  Unsteadiness  on feet  Difficulty in walking, not elsewhere classified  Rationale for Evaluation and Treatment Rehabilitation  SUBJECTIVE:                                                                                                                                                                                        SUBJECTIVE STATEMENT: Patient reports continued L medial knee pain, especially  when moving down  steps. She asked about driving. She does not feel ready yet, but would like to know how to progress at the time.  PERTINENT HISTORY: 71 year old Jehovah's Witness comes to the hospital with complaints of GERD, HTN, HLD, asthma, history of lumbar radiculopathy status post laminectomy/fusion  came to [facility] with complains of right facial numbness and tingling along with right lower extremity weakness.  Neurology recommended admitting patient for stroke work-up.  CT of the head was negative.  She was started on aspirin and Plavix.  Patient was seen by neurology team.  A1c 5.4, UDS negative.  MRI brain was also negative, LDL was 155.  Right-sided facial symptoms resolved but right lower extremity weakness which persisted therefore lumbar spine was ordered.  MRI spine showed disc bulge with slight neurocompression.  Case was discussed with Dr. Franky Macho from neurosurgery who recommended outpatient follow-up in 7 days of Decadron otherwise no acute surgical needs at this time. Later in the day had extensive discussion with patient regarding her care and her sister was present at bedside.  I explained to patient that she will need to follow-up outpatient with pain management for her lower back and right lower extremity pain and weakness, take 7 days of oral Decadron and get outpatient neurosurgery referral by her PCP. She also tells me that she tells me that she is not allergic to sulfa tells me that she is not allergic to aspirin as long as enteric-coated therefore willing to take aspirin and Plavix for 3 weeks followed by aspirin.  PAIN:  Are you having pain? Yes: NPRS scale: 5/10 Pain location: whole R side Pain description: cramping Aggravating factors: end of day Relieving factors: rest  PRECAUTIONS: None  WEIGHT BEARING RESTRICTIONS No  FALLS: Has patient fallen in last 6 months? No  LIVING ENVIRONMENT: Lives with: lives with their family Lives in: House/apartment Stairs: Yes: Internal: 12 steps; on right going up Has following equipment at home: Single point cane and Walker - 4 wheeled  PLOF: Independent  PATIENT GOALS Walk on her own, return to her normal routine.  OBJECTIVE:   DIAGNOSTIC FINDINGS: -CT head, MRI brain is negative for acute pathology.  CTA head and neck is  also negative.  A1c 5.4, UDS negative,  LDL 155.  Suspicion still remains for TIA.  A.  A.  Neurology recommending aspirin Plavix for 3 weeks followed by aspirin. -MRI lumbar spine showed bulging of the disc with nerve root compression.  Discussed with Dr Franky Macho who reviewed her MRI- no surgical indication, ok for week long decadron and outptn follow up.  She follows at [facility] pain management for monthly epidural bupivacaine/Kenalog injections. --Echocardiogram normal ef  COGNITION: Overall cognitive status: Within functional limits for tasks assessed   SENSATION: Patient reports numbness on R face, arm and leg  COORDINATION: Mildly slow fingers to thumb   MUSCLE TONE: RLE: Within functional limits   MUSCLE LENGTH: Hamstrings: Right 50 deg; Left 80 deg  POSTURE: No Significant postural limitations  LOWER EXTREMITY ROM:    PROM grossly WFL, R HS tightness noted.  LOWER EXTREMITY MMT:  LLE WNL  MMT Right Eval   Hip flexion 2   Hip extension 2   Hip abduction 2   Hip adduction    Hip internal rotation    Hip external rotation    Knee flexion 2   Knee extension 2   Ankle dorsiflexion 2+   Ankle plantarflexion    Ankle inversion    Ankle eversion    (Blank rows = not tested)  BED MOBILITY: I   TRANSFERS: Assistive device utilized: Environmental consultant - 4 wheeled  Sit to stand: Modified independence Stand to sit: Modified independence Chair to chair: Modified independence  STAIRS:  Level of Assistance: Modified independence  Stair Negotiation Technique: Step to Pattern with Bilateral Rails  Number of Stairs: 12   Height of Stairs: 6  Comments: Per patient report, to access her home.  GAIT: Gait pattern: step to pattern, decreased arm swing- Right, decreased arm swing- Left, decreased step length- Right, decreased stance time- Right, Right hip hike, trendelenburg, lateral hip instability, and trunk flexed Distance walked: 78' Assistive device utilized: Environmental consultant - 4  wheeled Level of assistance: Modified independence Comments: Patient moves very slow, approximately 2 minutes to travel 80'  FUNCTIONAL TESTs:  5 times sit to stand: 41.08 Timed up and go (TUG): TBD Berg Balance Scale: TBD (31 in inpatient)  TODAY'S TREATMENT:  05/15/22 NuStep L4 with U and LE x 6 minutes Sit to stand x 5 from progressively lower surface, no UE support. Patient reported mild L medial knee pain, but did not fall into IR TKE in stand, 10 each, Green Tband Side to side step against green Tband at knees, 10 each way. Mini tramp- jump feet out/in and alternating stride, 2 x 5 reps each way. With rest breaks. Walking while holding 3# wate bar. Chest press while walking. 20' Iontophoresis to L medial knee.    05/12/22 Walking with 2# WATE bar, 2 x 25', CGA, stp through gait. Standing hold 2# WATE Bar- forward lunges x 8 with each leg, then lateral lunges x 8 with each leg. NuStep L3 x 6 minutes, U and LE Paloff press with R side closest to weight 5#, 10 reps. Mini tramp jumping then jump abd/add. Able speed up movements with repetition, 3 x 5 sets with Bue support and CGA with belt.  05/10/22 Nustep L1-> progressing to L4 x5 minutes R LE only (unable to push with R LE when both legs were on the machine, but able to perform with R LE alone on Nustep)  -Step up onto 4 inch step with BUEs x5 R Mod verbal cues for lifting R LE high enough  -Toe taps on 8  inch step with L LE while maintaining stance R LE U UE support x10  -Standing hip ABD x10 BLEs  - LAQs 3# 1x10 L LE unable R LE even with 0# - seated mini-marches R LE 0#  1x10 - attempted tandem stance in bars- declined to perform without U UE support  - B heel and toe raises BUE support x10 each  - gait training in // bars with U UE progressing to one finger x2 full laps  - side steps in // bars 2 full laps two finger support   05/05/22 NuStep L3 x 6 minutes Supine-RLE press into red physio ball while lifting LLE.  Required mod facilitation initially, improved to light min Standing-Step back and out with LLE, turn back and reach with LUE to touch the target behind. 8 times to L and 5 times to R. Required increased A turning to R and mod facilitation to weight shift onto the leg once turned. Gait training with shopping cart, 1 x 200', patient initally with more normalized gait pattern, but as she progressed she started dragging R foot more. Standing at 4" step, place R foot on step. Step ups with BUE support. Improved in her R activation and use with repetition.  Placed L foot on step, R knee flex/ext.  05/03/22 Nustep L3 x736mins  STS w/red ball push out, CGA 2x5 Calf raises holding on to rollator 2x10  Step ups 4" up with L, difficulty picking up R   Walking with weighted shopping cart  Side steps with [facility]   04/27/22 Nustep level 3 x 6 minutes Ball b/n knees squeeze LAQ on the left 2.5#, AAROM LAQ on the right Right ankle red tband PF, AAROM DF right ankle Marching on the left 2.5#, right AAROM Stairs 6" and 4" up and down with CGA, one at a time Supine feet on ball K2C, trunk rotation, small bridges, isometric abs SAQ with left leg assist 3x5  04/25/22 NuStep at L2 x 5 minutes, required Vc and TC to release LLE when attempting to push with R TUG-23.34 BERG 37 Seated knee flexion and ext with R foot on towel to decrease friction-very slow and labored in both directions, x 3 Seated heel raise/toe raise Standing stepping forward and back. Sit to/from stand x 5   Education   PATIENT EDUCATION: Education details: POC, verbal HEP Person educated: Patient and sister Education method: Medical illustratorxplanation and Demonstration Education comprehension: verbalized understanding and returned demonstration   HOME EXERCISE PROGRAM: Verbal-hip flexion, knee ext, ankle DF/PF-all in sitting. Consciously use RLE in all activities as much as possible.  [id number]  GOALS: Goals reviewed with patient? Yes  SHORT  TERM GOALS: Target date: 05/23/2022  I with basic HEP Baseline: Goal status: ongoing  2.  5x STS in < 24 seconds Baseline: 41 Goal status: ongoing  LONG TERM GOALS: Target date: 07/14/2022  I with final HEP Baseline:  Goal status: INITIAL  2.  5 x STS in < 15 seconds Baseline: 41 Goal status:ongoing  3.  Increase RLE strength to at least 4-/5 throughout Baseline: 2/5, 3-/5-8/25 Goal status: ongoing  4.  Paitent will ambulate x at least 400' with LRAD, MI, minimized gait deviations, functional speed. Baseline:  Goal status: INITIAL  5.  Patient will score at least 40/56 on BERG balance assessment. Baseline: 31/56 in hospital, 8/8-37 Goal status: ongoing  6.  Paitent will complete TUG in < 15 sec with LRAD. Baseline: 23.34 Goal status: ongoing   ASSESSMENT:  CLINICAL IMPRESSION:  Paitent reports fatigue after last treatment. L knee pain remains. Continued to address speed of movement and strength to decrease fall risk. Applied Ionto patch to L medial knee for pain relief.   OBJECTIVE IMPAIRMENTS Abnormal gait, decreased activity tolerance, decreased balance, decreased coordination, decreased endurance, decreased mobility, difficulty walking, decreased strength, impaired UE functional use, improper body mechanics, postural dysfunction, and pain.   ACTIVITY LIMITATIONS carrying, lifting, bending, standing, squatting, sleeping, stairs, transfers, bathing, toileting, dressing, reach over head, hygiene/grooming, locomotion level, and caring for others  PARTICIPATION LIMITATIONS: meal prep, cleaning, laundry, shopping, and community activity  PERSONAL FACTORS Age are also affecting patient's functional outcome.   REHAB POTENTIAL: Good  CLINICAL DECISION MAKING: Evolving/moderate complexity  EVALUATION COMPLEXITY: Moderate  PLAN: PT FREQUENCY: 2x/week  PT DURATION: 12 weeks  PLANNED INTERVENTIONS: Therapeutic exercises, Therapeutic activity, Neuromuscular  re-education, Balance training, Gait training, Patient/Family education, Self Care, Joint mobilization, Stair training, Ionotophoresis 4mg /ml Dexamethasone, and Manual therapy  PLAN FOR NEXT SESSION: Progressive NM re-ed for her R LE, more walking with cart, cone taps, balance on airex     DPT 05/15/22 1:13 PM  05/15/2022, 1:13 PM

## 2022-05-17 ENCOUNTER — Ambulatory Visit: Payer: Medicare Other | Admitting: Physical Therapy

## 2022-05-17 ENCOUNTER — Encounter: Payer: Self-pay | Admitting: Physical Therapy

## 2022-05-17 DIAGNOSIS — R2681 Unsteadiness on feet: Secondary | ICD-10-CM

## 2022-05-17 DIAGNOSIS — I69351 Hemiplegia and hemiparesis following cerebral infarction affecting right dominant side: Secondary | ICD-10-CM

## 2022-05-17 DIAGNOSIS — R262 Difficulty in walking, not elsewhere classified: Secondary | ICD-10-CM

## 2022-05-17 DIAGNOSIS — M6281 Muscle weakness (generalized): Secondary | ICD-10-CM

## 2022-05-17 DIAGNOSIS — R278 Other lack of coordination: Secondary | ICD-10-CM

## 2022-05-17 NOTE — Therapy (Addendum)
OUTPATIENT PHYSICAL THERAPY NEURO TREATMENT   Patient Name: April Hunt MRN: [id number] DOB:May 25, 1951, 71 y.o., female Today's Date: 05/17/2022   PCP: [name] REFERRING PROVIDER: Dimple Nanas, MD    PT End of Session - 05/17/22 1504     Visit Number 9    Date for PT Re-Evaluation 07/14/22    PT Start Time 1459    PT Stop Time 1540    PT Time Calculation (min) 41 min    Equipment Utilized During Treatment Gait belt    Activity Tolerance Patient tolerated treatment well    Behavior During Therapy WFL for tasks assessed/performed                    Past Medical History:  Diagnosis Date   Allergy    seasonal   Arthritis    Asthma 12/25/2011   Asthma 12/25/2011   Formatting of this note might be different from the original. Last Assessment & Plan:  Currently stable with PRN albuterol.   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   Pure hypercholesterolemia    Refusal of blood transfusions as patient is Jehovah's Witness    Sinusitis 03/04/2013   Tinea pedis 03/19/2012   Tinea versicolor 03/19/2012   Past Surgical History:  Procedure  Laterality Date   ABDOMINAL HYSTERECTOMY  1998   Still has cervix and both ovaries   APPENDECTOMY     BACK SURGERY     JOINT REPLACEMENT     scope bil knees   KNEE ARTHROSCOPY  1997   right knee   Lymph node resection  1999   benign   MENISCUS REPAIR  1995   left knee   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   DR CAFFREY   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   Procedure: RIGHT TOTAL KNEE ARTHROPLASTY;  Surgeon: Thera Flake., MD;  Location: [facility] OR;  Service: Orthopedics;  Laterality: Right;   Patient Active Problem List   Diagnosis Date Noted   Acute stroke due to ischemia (HCC) 04/03/2022   Lumbar degenerative disc disease 03/07/2022   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   Post-menopause atrophic vaginitis 02/08/2017   GERD without esophagitis 03/07/2016   Essential hypertension 11/16/2014   Osteoarthritis of right knee 12/26/2013   Mild intermittent asthma without complication 12/25/2011    ONSET DATE: 04/05/2022   REFERRING DIAG: Diagnosis R29.90 (ICD-10-CM) - Stroke-like symptoms   THERAPY DIAG:  Hemiplegia and hemiparesis following cerebral infarction affecting right dominant side (HCC)  Other lack of coordination  Muscle weakness (generalized)  Unsteadiness on feet  Difficulty in walking, not elsewhere classified  Rationale for Evaluation and Treatment Rehabilitation  SUBJECTIVE:                                                                                                                                                                                        SUBJECTIVE STATEMENT: Patient reports continued L medial knee pain. The Ionto helped, but the knee pain has since returned.  PERTINENT HISTORY: 71 year old Jehovah's Witness comes to the hospital with complaints of GERD, HTN, HLD, asthma, history of lumbar radiculopathy status post laminectomy/fusion came to [facility] with complains of right facial numbness and tingling along with right lower  extremity weakness.  Neurology recommended admitting patient for stroke work-up.  CT of the head was negative.  She was started on aspirin and Plavix.  Patient was seen by neurology team.  A1c 5.4, UDS negative.  MRI brain was also negative, LDL was 155.  Right-sided facial symptoms resolved but right lower extremity weakness which persisted therefore lumbar spine was ordered.  MRI spine showed disc bulge with slight neurocompression.  Case was discussed with Dr. Franky Macho from neurosurgery who recommended outpatient follow-up in 7 days of Decadron otherwise no acute surgical needs at this time. Later in the day had extensive discussion with patient regarding her care and her sister was present at bedside.  I explained to patient that she will need to follow-up outpatient with pain management for her lower back and right lower extremity pain and weakness, take 7 days of oral Decadron and get outpatient neurosurgery referral by her PCP. She also tells me that she tells me that she is not allergic to sulfa tells me that she is not allergic to aspirin as long as enteric-coated therefore willing to take aspirin and Plavix for 3 weeks followed by aspirin.  PAIN:  Are you having pain? Yes: NPRS scale: 5/10 Pain location: whole R side Pain description: cramping Aggravating factors: end of day Relieving factors: rest  PRECAUTIONS: None  WEIGHT BEARING RESTRICTIONS No  FALLS: Has patient fallen in last 6 months? No  LIVING ENVIRONMENT: Lives with: lives with their family Lives in: House/apartment Stairs: Yes: Internal: 12 steps; on right going up Has following equipment at home: Single point cane and Walker - 4 wheeled  PLOF: Independent  PATIENT GOALS Walk on her own, return to her normal routine.  OBJECTIVE:   DIAGNOSTIC FINDINGS: -CT head, MRI brain is negative for acute pathology.  CTA head and neck is also negative.  A1c 5.4, UDS negative, LDL 155.  Suspicion still remains for TIA.  A.  A.   Neurology recommending aspirin Plavix  for 3 weeks followed by aspirin. -MRI lumbar spine showed bulging of the disc with nerve root compression.  Discussed with Dr Franky Macho who reviewed her MRI- no surgical indication, ok for week long decadron and outptn follow up.  She follows at [facility] pain management for monthly epidural bupivacaine/Kenalog injections. --Echocardiogram normal ef  COGNITION: Overall cognitive status: Within functional limits for tasks assessed   SENSATION: Patient reports numbness on R face, arm and leg  COORDINATION: Mildly slow fingers to thumb   MUSCLE TONE: RLE: Within functional limits   MUSCLE LENGTH: Hamstrings: Right 50 deg; Left 80 deg  POSTURE: No Significant postural limitations  LOWER EXTREMITY ROM:    PROM grossly WFL, R HS tightness noted.  LOWER EXTREMITY MMT:  LLE WNL  MMT Right Eval   Hip flexion 2   Hip extension 2   Hip abduction 2   Hip adduction    Hip internal rotation    Hip external rotation    Knee flexion 2   Knee extension 2   Ankle dorsiflexion 2+   Ankle plantarflexion    Ankle inversion    Ankle eversion    (Blank rows = not tested)  BED MOBILITY: I   TRANSFERS: Assistive device utilized: Environmental consultant - 4 wheeled  Sit to stand: Modified independence Stand to sit: Modified independence Chair to chair: Modified independence  STAIRS:  Level of Assistance: Modified independence  Stair Negotiation Technique: Step to Pattern with Bilateral Rails  Number of Stairs: 12   Height of Stairs: 6  Comments: Per patient report, to access her home.  GAIT: Gait pattern: step to pattern, decreased arm swing- Right, decreased arm swing- Left, decreased step length- Right, decreased stance time- Right, Right hip hike, trendelenburg, lateral hip instability, and trunk flexed Distance walked: 64' Assistive device utilized: Environmental consultant - 4 wheeled Level of assistance: Modified independence Comments: Patient moves very slow, approximately  2 minutes to travel 79'  FUNCTIONAL TESTs:  5 times sit to stand: 41.08 Timed up and go (TUG): TBD Berg Balance Scale: TBD (31 in inpatient)  TODAY'S TREATMENT:  05/17/22 NuStep L5 x 6 minutes. LLE step taps onto 4" step, BUE support on RW for balance, CGA, 10 nreps RLE step ups onto 4" step with BUE support, encouraged to minimize BUE support 10 reps. Seated hip flexion, lifting R foot up onto 4" step x 10, increased effort required. Step ups onto Airex pad and off while holding 2# WATE bar in BUE. Lateral steps ups onto and off Airex pad holding 2# WATE bar, occasional min A for balance. Heel raises while holding 2# WATE bar, 2 x 10 Ambulated while holding 2# WATE bar-forward, back and B side step, turns, CGA, total of approximately 120'. No unsteadiness noted. Ionto to R lateral knee  05/15/22 NuStep L4 with U and LE x 6 minutes Sit to stand x 5 from progressively lower surface, no UE support. Patient reported mild L medial knee pain, but did not fall into IR TKE in stand, 10 each, Green Tband Side to side step against green Tband at knees, 10 each way. Mini tramp- jump feet out/in and alternating stride, 2 x 5 reps each way. With rest breaks. Walking while holding 3# wate bar. Chest press while walking. 50' Iontophoresis to L medial knee.    05/12/22 Walking with 2# WATE bar, 2 x 25', CGA, stp through gait. Standing hold 2# WATE Bar- forward lunges x 8 with each leg, then lateral lunges x 8 with each leg.  NuStep L3 x 6 minutes, U and LE Paloff press with R side closest to weight 5#, 10 reps. Mini tramp jumping then jump abd/add. Able speed up movements with repetition, 3 x 5 sets with Bue support and CGA with belt.  05/10/22 Nustep L1-> progressing to L4 x5 minutes R LE only (unable to push with R LE when both legs were on the machine, but able to perform with R LE alone on Nustep)  -Step up onto 4 inch step with BUEs x5 R Mod verbal cues for lifting R LE high enough  -Toe  taps on 8 inch step with L LE while maintaining stance R LE U UE support x10  -Standing hip ABD x10 BLEs  - LAQs 3# 1x10 L LE unable R LE even with 0# - seated mini-marches R LE 0#  1x10 - attempted tandem stance in bars- declined to perform without U UE support  - B heel and toe raises BUE support x10 each  - gait training in // bars with U UE progressing to one finger x2 full laps  - side steps in // bars 2 full laps two finger support   05/05/22 NuStep L3 x 6 minutes Supine-RLE press into red physio ball while lifting LLE. Required mod facilitation initially, improved to light min Standing-Step back and out with LLE, turn back and reach with LUE to touch the target behind. 8 times to L and 5 times to R. Required increased A turning to R and mod facilitation to weight shift onto the leg once turned. Gait training with shopping cart, 1 x 200', patient initally with more normalized gait pattern, but as she progressed she started dragging R foot more. Standing at 4" step, place R foot on step. Step ups with BUE support. Improved in her R activation and use with repetition.  Placed L foot on step, R knee flex/ext.  05/03/22 Nustep L3 x21mins  STS w/red ball push out, CGA 2x5 Calf raises holding on to rollator 2x10  Step ups 4" up with L, difficulty picking up R   Walking with weighted shopping cart  Side steps with [facility]   04/27/22 Nustep level 3 x 6 minutes Ball b/n knees squeeze LAQ on the left 2.5#, AAROM LAQ on the right Right ankle red tband PF, AAROM DF right ankle Marching on the left 2.5#, right AAROM Stairs 6" and 4" up and down with CGA, one at a time Supine feet on ball K2C, trunk rotation, small bridges, isometric abs SAQ with left leg assist 3x5  04/25/22 NuStep at L2 x 5 minutes, required Vc and TC to release LLE when attempting to push with R TUG-23.34 BERG 37 Seated knee flexion and ext with R foot on towel to decrease friction-very slow and labored in both directions,  x 3 Seated heel raise/toe raise Standing stepping forward and back. Sit to/from stand x 5   Education   PATIENT EDUCATION: Education details: POC, verbal HEP Person educated: Patient and sister Education method: Medical illustrator Education comprehension: verbalized understanding and returned demonstration   HOME EXERCISE PROGRAM: Verbal-hip flexion, knee ext, ankle DF/PF-all in sitting. Consciously use RLE in all activities as much as possible.  [id number]  GOALS: Goals reviewed with patient? Yes  SHORT TERM GOALS: Target date: 05/23/2022  I with basic HEP Baseline: Goal status: ongoing  2.  5x STS in < 24 seconds Baseline: 41 Goal status: ongoing  LONG TERM GOALS: Target date: 07/14/2022  I with final HEP Baseline:  Goal status: INITIAL  2.  5 x STS in < 15 seconds Baseline: 41 Goal status:ongoing  3.  Increase RLE strength to at least 4-/5 throughout Baseline: 2/5, 3-/5-8/25 Goal status: ongoing  4.  Paitent will ambulate x at least 400' with LRAD, MI, minimized gait deviations, functional speed. Baseline:  Goal status: INITIAL  5.  Patient will score at least 40/56 on BERG balance assessment. Baseline: 31/56 in hospital, 8/8-37 Goal status: ongoing  6.  Paitent will complete TUG in < 15 sec with LRAD. Baseline: 23.34 Goal status: ongoing   ASSESSMENT:  CLINICAL IMPRESSION: Paitent reports that the Ionto patch helped her knee pain temporarily, but it has returned. She was able to lift RLE up to place it on the NuStep machine today without A. Performed progressive strength and balance training, uisng weighted bar to activate trunk.   OBJECTIVE IMPAIRMENTS Abnormal gait, decreased activity tolerance, decreased balance, decreased coordination, decreased endurance, decreased mobility, difficulty walking, decreased strength, impaired UE functional use, improper body mechanics, postural dysfunction, and pain.   ACTIVITY LIMITATIONS carrying,  lifting, bending, standing, squatting, sleeping, stairs, transfers, bathing, toileting, dressing, reach over head, hygiene/grooming, locomotion level, and caring for others  PARTICIPATION LIMITATIONS: meal prep, cleaning, laundry, shopping, and community activity  PERSONAL FACTORS Age are also affecting patient's functional outcome.   REHAB POTENTIAL: Good  CLINICAL DECISION MAKING: Evolving/moderate complexity  EVALUATION COMPLEXITY: Moderate  PLAN: PT FREQUENCY: 2x/week  PT DURATION: 12 weeks  PLANNED INTERVENTIONS: Therapeutic exercises, Therapeutic activity, Neuromuscular re-education, Balance training, Gait training, Patient/Family education, Self Care, Joint mobilization, Stair training, Ionotophoresis 4mg /ml Dexamethasone, and Manual therapy  PLAN FOR NEXT SESSION: Progressive NM re-ed for her R LE, more walking with cart, cone taps, balance on airex     Oley BalmSusan Shellsea Borunda DPT 05/17/22 3:49 PM  05/17/2022, 3:49 PM

## 2022-05-23 ENCOUNTER — Ambulatory Visit: Payer: Medicare Other | Attending: Internal Medicine | Admitting: Physical Therapy

## 2022-05-23 ENCOUNTER — Encounter: Payer: Self-pay | Admitting: Physical Therapy

## 2022-05-23 DIAGNOSIS — I69351 Hemiplegia and hemiparesis following cerebral infarction affecting right dominant side: Secondary | ICD-10-CM | POA: Insufficient documentation

## 2022-05-23 DIAGNOSIS — M6281 Muscle weakness (generalized): Secondary | ICD-10-CM | POA: Insufficient documentation

## 2022-05-23 DIAGNOSIS — R262 Difficulty in walking, not elsewhere classified: Secondary | ICD-10-CM | POA: Insufficient documentation

## 2022-05-23 DIAGNOSIS — R278 Other lack of coordination: Secondary | ICD-10-CM | POA: Insufficient documentation

## 2022-05-23 DIAGNOSIS — R2681 Unsteadiness on feet: Secondary | ICD-10-CM | POA: Diagnosis present

## 2022-05-23 NOTE — Therapy (Signed)
OUTPATIENT PHYSICAL THERAPY NEURO TREATMENT Progress Note Reporting Period 04/21/22 to 05/23/22  See note below for Objective Data and Assessment of Progress/Goals.      Patient Name: April Hunt MRN: [id number] DOB:06/30/1951, 71 y.o., female Today's Date: 05/23/2022   PCP: Ronnald Ramp, [name] REFERRING PROVIDER: Damita Lack, MD    PT End of Session - 05/23/22 1317     Visit Number 10    Date for PT Re-Evaluation 07/14/22    PT Start Time 6004    PT Stop Time 1356    PT Time Calculation (min) 42 min    Equipment Utilized During Treatment Gait belt    Activity Tolerance Patient tolerated treatment well    Behavior During Therapy WFL for tasks assessed/performed                     Past Medical History:  Diagnosis Date   Allergy    seasonal   Arthritis    Asthma 12/25/2011   Asthma 12/25/2011   Formatting of this note might be different from the original. Last Assessment & Plan:  Currently stable with PRN albuterol.   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   Pure hypercholesterolemia    Refusal of blood transfusions as patient is [employer]'s  W[employer]    Sinusitis 03/04/2013   Tinea pedis 03/19/2012   Tinea versicolor 03/19/2012   Past Surgical History:  Procedure Laterality Date   ABDOMINAL HYSTERECTOMY  1998   Still has cervix and both ovaries   APPENDECTOMY     BACK SURGERY     JOINT REPLACEMENT     scope bil knees   KNEE ARTHROSCOPY  1997   right knee   Lymph node resection  1999   benign   MENISCUS REPAIR  1995   left knee   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   DR CAFFREY   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   Procedure: RIGHT TOTAL KNEE ARTHROPLASTY;  Surgeon: Yvette Rack., MD;  Location: [place];  Service: Orthopedics;  Laterality: Right;   Patient Active Problem List   Diagnosis Date Noted   Acute stroke due to ischemia ([place]) 04/03/2022   Lumbar degenerative disc disease 03/07/2022   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   Post-menopause atrophic vaginitis 02/08/2017   GERD without esophagitis 03/07/2016   Essential hypertension 11/16/2014   Osteoarthritis of right knee 12/26/2013   Mild intermittent asthma without complication 42/39/5320    ONSET DATE: 04/05/2022   REFERRING DIAG: Diagnosis R29.90 (ICD-10-CM) - Stroke-like symptoms   THERAPY DIAG:  Hemiplegia and hemiparesis following cerebral infarction affecting right dominant side (HCC)  Other lack of coordination  Muscle weakness (generalized)  Unsteadiness on feet  Difficulty in walking, not elsewhere classified  Rationale for Evaluation and Treatment Rehabilitation  SUBJECTIVE:                                                                                                                                                                                        SUBJECTIVE STATEMENT: Patient reports that she is walking at home using her cane or furniture walking. The R leg gets tired and she needs to allow it to rest.  PERTINENT HISTORY: 71 year old [place] comes to the hospital with complaints of GERD, HTN, HLD,  asthma, history of lumbar radiculopathy status post laminectomy/fusion came to [facility] with complains of right facial numbness and tingling along with right lower extremity weakness.  Neurology recommended admitting patient for stroke work-up.  CT of the head was negative.  She was started on aspirin and Plavix.  Patient was seen by neurology team.  A1c 5.4, UDS negative.  MRI brain was also negative, LDL was 155.  Right-sided facial symptoms resolved but right lower extremity weakness which persisted therefore lumbar spine was ordered.  MRI spine showed disc bulge with slight neurocompression.  Case was discussed with Dr. Christella Noa from neurosurgery who recommended outpatient follow-up in 7 days of Decadron otherwise no acute surgical needs at this time. Later in the day had extensive discussion with patient regarding her care and her sister was present at bedside.  I explained to patient that she will need to follow-up outpatient with pain management for her lower back and right lower extremity pain and weakness, take 7 days of oral Decadron and get outpatient neurosurgery referral by her PCP. She also tells me that she tells me that she is not allergic to sulfa tells me that she is not allergic to aspirin as long as enteric-coated therefore willing to take aspirin and Plavix for 3 weeks followed by aspirin.  PAIN:  Are you having pain? Yes: NPRS scale: 5/10 Pain location: whole R side Pain description: cramping Aggravating factors: end of day Relieving factors: rest  PRECAUTIONS: None  WEIGHT BEARING RESTRICTIONS No  FALLS: Has patient fallen in last 6 months? No  LIVING ENVIRONMENT: Lives with: lives with their family Lives in: House/apartment Stairs: Yes: Internal: 12 steps; on right going up Has following equipment at home: Single point cane and Walker - 4 wheeled  PLOF: Independent  PATIENT GOALS Walk on her own, return to her normal routine.  OBJECTIVE:   DIAGNOSTIC FINDINGS: -CT  head, MRI brain is negative for acute  pathology.  CTA head and neck is also negative.  A1c 5.4, UDS negative, LDL 155.  Suspicion still remains for TIA.  A.  A.  Neurology recommending aspirin Plavix for 3 weeks followed by aspirin. -MRI lumbar spine showed bulging of the disc with nerve root compression.  Discussed with Dr Christella Noa who reviewed her MRI- no surgical indication, ok for week long decadron and outptn follow up.  She follows at [facility] pain management for monthly epidural bupivacaine/Kenalog injections. --Echocardiogram normal ef  COGNITION: Overall cognitive status: Within functional limits for tasks assessed   SENSATION: Patient reports numbness on R face, arm and leg  COORDINATION: Mildly slow fingers to thumb   MUSCLE TONE: RLE: Within functional limits   MUSCLE LENGTH: Hamstrings: Right 50 deg; Left 80 deg  POSTURE: No Significant postural limitations  LOWER EXTREMITY ROM:    PROM grossly WFL, R HS tightness noted.  LOWER EXTREMITY MMT:  LLE WNL  MMT Right Eval   Hip flexion 2   Hip extension 2   Hip abduction 2   Hip adduction    Hip internal rotation    Hip external rotation    Knee flexion 2   Knee extension 2   Ankle dorsiflexion 2+   Ankle plantarflexion    Ankle inversion    Ankle eversion    (Blank rows = not tested)  BED MOBILITY: I   TRANSFERS: Assistive device utilized: Environmental consultant - 4 wheeled  Sit to stand: Modified independence Stand to sit: Modified independence Chair to chair: Modified independence  STAIRS:  Level of Assistance: Modified independence  Stair Negotiation Technique: Step to Pattern with Bilateral Rails  Number of Stairs: 12   Height of Stairs: 6  Comments: Per patient report, to access her home.  GAIT: Gait pattern: step to pattern, decreased arm swing- Right, decreased arm swing- Left, decreased step length- Right, decreased stance time- Right, Right hip hike, trendelenburg, lateral hip instability, and trunk  flexed Distance walked: 36' Assistive device utilized: Environmental consultant - 4 wheeled Level of assistance: Modified independence Comments: Patient moves very slow, approximately 2 minutes to travel 5'  FUNCTIONAL TESTs:  5 times sit to stand: 41.08 Timed up and go (TUG): TBD Berg Balance Scale: TBD (31 in inpatient)  TODAY'S TREATMENT:  05/23/22 NuStep L5 x 6 minutes Functional re-assessment performed, see goals for results. Ambulated with 5# weight in BUE to facilitate trunk activation and stability.  05/17/22 NuStep L5 x 6 minutes. LLE step taps onto 4" step, BUE support on RW for balance, CGA, 10 nreps RLE step ups onto 4" step with BUE support, encouraged to minimize BUE support 10 reps. Seated hip flexion, lifting R foot up onto 4" step x 10, increased effort required. Step ups onto Airex pad and off while holding 2# WATE bar in BUE. Lateral steps ups onto and off Airex pad holding 2# WATE bar, occasional min A for balance. Heel raises while holding 2# WATE bar, 2 x 10 Ambulated while holding 2# WATE bar-forward, back and B side step, turns, CGA, total of approximately 120'. No unsteadiness noted. Ionto to R lateral knee  05/15/22 NuStep L4 with U and LE x 6 minutes Sit to stand x 5 from progressively lower surface, no UE support. Patient reported mild L medial knee pain, but did not fall into IR TKE in stand, 10 each, Green Tband Side to side step against green Tband at knees, 10 each way. Mini tramp- jump feet out/in and alternating stride, 2 x 5 reps  each way. With rest breaks. Walking while holding 3# wate bar. Chest press while walking. 34' Iontophoresis to L medial knee.   05/12/22 Walking with 2# WATE bar, 2 x 25', CGA, stp through gait. Standing hold 2# WATE Bar- forward lunges x 8 with each leg, then lateral lunges x 8 with each leg. NuStep L3 x 6 minutes, U and LE Paloff press with R side closest to weight 5#, 10 reps. Mini tramp jumping then jump abd/add. Able speed up  movements with repetition, 3 x 5 sets with Bue support and CGA with belt.  05/10/22 Nustep L1-> progressing to L4 x5 minutes R LE only (unable to push with R LE when both legs were on the machine, but able to perform with R LE alone on Nustep)  -Step up onto 4 inch step with BUEs x5 R Mod verbal cues for lifting R LE high enough  -Toe taps on 8 inch step with L LE while maintaining stance R LE U UE support x10  -Standing hip ABD x10 BLEs  - LAQs 3# 1x10 L LE unable R LE even with 0# - seated mini-marches R LE 0#  1x10 - attempted tandem stance in bars- declined to perform without U UE support  - B heel and toe raises BUE support x10 each  - gait training in // bars with U UE progressing to one finger x2 full laps  - side steps in // bars 2 full laps two finger support   05/05/22 NuStep L3 x 6 minutes Supine-RLE press into red physio ball while lifting LLE. Required mod facilitation initially, improved to light min Standing-Step back and out with LLE, turn back and reach with LUE to touch the target behind. 8 times to L and 5 times to R. Required increased A turning to R and mod facilitation to weight shift onto the leg once turned. Gait training with shopping cart, 1 x 200', patient initally with more normalized gait pattern, but as she progressed she started dragging R foot more. Standing at 4" step, place R foot on step. Step ups with BUE support. Improved in her R activation and use with repetition.  Placed L foot on step, R knee flex/ext.  05/03/22 Nustep L3 x55mns  STS w/red ball push out, CGA 2x5 Calf raises holding on to rollator 2x10  Step ups 4" up with L, difficulty picking up R   Walking with weighted shopping cart  [facility]  04/27/22 Nustep level 3 x 6 minutes Ball b/n knees squeeze LAQ on the left 2.5#, AAROM LAQ on the right Right ankle red tband PF, AAROM DF right ankle Marching on the left 2.5#, right AAROM Stairs 6" and 4" up and down with CGA, one at  a time Supine feet on ball K2C, trunk rotation, small bridges, isometric abs SAQ with left leg assist 3x5  04/25/22 NuStep at L2 x 5 minutes, required Vc and TC to release LLE when attempting to push with R TUG-23.34 BERG 37 Seated knee flexion and ext with R foot on towel to decrease friction-very slow and labored in both directions, x 3 Seated heel raise/toe raise Standing stepping forward and back. Sit to/from stand x 5  PATIENT EDUCATION: Education details: results of re-assessment Person educated: Patient Education method: Explanation and Demonstration Education comprehension: verbalized understanding and returned demonstration   HOME EXERCISE PROGRAM: Verbal-hip flexion, knee ext, ankle DF/PF-all in sitting. Consciously use RLE in all activities as much as possible.  [id number]  GOALS: Goals reviewed with patient? Yes  SHORT TERM GOALS: Target date: 05/23/2022  I with basic HEP Baseline: Goal status: met  2.  5x STS in < 24 seconds Baseline: 41,  05/23/22-19.09 Goal status: met  LONG TERM GOALS: Target date: 07/14/2022  I with final HEP Baseline:  Goal status: ongoing  2.  5 x STS in < 15 seconds Baseline: 41, 05/23/22-19.09 Goal status:ongoing  3.  Increase RLE strength to at least 4-/5 throughout Baseline: 2/5, 3-/5-8/25, 05/23/22-3-/5 with MMT Goal status: ongoing  4.  Paitent will ambulate x at least 400' with LRAD, MI, minimized gait deviations, functional speed. Baseline: 05/23/22-300', Rollator, mildly slow, fatigued Goal status: ongoing  5.  Patient will score at least 40/56 on BERG balance assessment. Baseline: 31/56 in hospital, 8/8-37, 05/23/22-42/56 Goal status: met  6.  Paitent will complete TUG in < 15 sec with LRAD. Baseline: 23.34, 05/23/22-13.38 Goal status: met   ASSESSMENT:  CLINICAL IMPRESSION: Paitent reports overall improvement. She was re-assessed for PN, has met all STG, some of her LTG, and has progressed toward the remaining LTG. Her  RLE strength is improved, but still limited, she demonstrates lateral instability and still needs to improve speed of movement. Will benefit from continued PT for NM re-education.   OBJECTIVE IMPAIRMENTS Abnormal gait, decreased activity tolerance, decreased balance, decreased coordination, decreased endurance, decreased mobility, difficulty walking, decreased strength, impaired UE functional use, improper body mechanics, postural dysfunction, and pain.   ACTIVITY LIMITATIONS carrying, lifting, bending, standing, squatting, sleeping, stairs, transfers, bathing, toileting, dressing, reach over head, hygiene/grooming, locomotion level, and caring for others  PARTICIPATION LIMITATIONS: meal prep, cleaning, laundry, shopping, and community activity  PERSONAL FACTORS Age are also affecting patient's functional outcome.   REHAB POTENTIAL: Good  CLINICAL DECISION MAKING: Evolving/moderate complexity  EVALUATION COMPLEXITY: Moderate  PLAN: PT FREQUENCY: 2x/week  PT DURATION: 12 weeks  PLANNED INTERVENTIONS: Therapeutic exercises, Therapeutic activity, Neuromuscular re-education, Balance training, Gait training, Patient/Family education, Self Care, Joint mobilization, Stair training, Ionotophoresis 67m/ml Dexamethasone, and Manual therapy  PLAN FOR NEXT SESSION: Strengthening for RLE, stair training, balance with narrow BOS, speed of movement.    SEthel RanaDPT 05/23/22 2:04 PM  05/23/2022, 2:04 PM

## 2022-05-26 ENCOUNTER — Ambulatory Visit: Payer: Medicare Other | Admitting: Physical Therapy

## 2022-05-26 ENCOUNTER — Encounter: Payer: Self-pay | Admitting: Physical Therapy

## 2022-05-26 DIAGNOSIS — R2681 Unsteadiness on feet: Secondary | ICD-10-CM

## 2022-05-26 DIAGNOSIS — I69351 Hemiplegia and hemiparesis following cerebral infarction affecting right dominant side: Secondary | ICD-10-CM | POA: Diagnosis not present

## 2022-05-26 DIAGNOSIS — M6281 Muscle weakness (generalized): Secondary | ICD-10-CM

## 2022-05-26 DIAGNOSIS — R278 Other lack of coordination: Secondary | ICD-10-CM

## 2022-05-26 DIAGNOSIS — R262 Difficulty in walking, not elsewhere classified: Secondary | ICD-10-CM

## 2022-05-26 NOTE — Therapy (Signed)
OUTPATIENT PHYSICAL THERAPY NEURO TREATMENT Progress Note Reporting Period 04/21/22 to 05/23/22  See note below for Objective Data and Assessment of Progress/Goals.      Patient Name: April Hunt MRN: [id number] DOB:1951-06-29, 71 y.o., female Today's Date: 05/26/2022   PCP: April Hunt, April Hunt REFERRING PROVIDER: Damita Lack, Hunt    PT End of Session - 05/26/22 1103     Visit Number 11    Date for PT Re-Evaluation 07/14/22    PT Start Time 1059    PT Stop Time 1140    PT Time Calculation (min) 41 min    Equipment Utilized During Treatment Gait belt    Activity Tolerance Patient tolerated treatment well    Behavior During Therapy WFL for tasks assessed/performed                      Past Medical History:  Diagnosis Date   Allergy    seasonal   Arthritis    Asthma 12/25/2011   Asthma 12/25/2011   Formatting of this note might be different from the original. Last Assessment & Plan:  Currently stable with PRN albuterol.   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   Pure hypercholesterolemia    Refusal of blood transfusions as patient is [employer]hov[employer]'s  Witness    Sinusitis 03/04/2013   Tinea pedis 03/19/2012   Tinea versicolor 03/19/2012   Past Surgical History:  Procedure Laterality Date   ABDOMINAL HYSTERECTOMY  1998   Still has cervix and both ovaries   APPENDECTOMY     BACK SURGERY     JOINT REPLACEMENT     scope bil knees   KNEE ARTHROSCOPY  1997   right knee   Lymph node resection  1999   benign   MENISCUS REPAIR  1995   left knee   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   [facility]   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   Procedure: RIGHT TOTAL KNEE ARTHROPLASTY;  Surgeon: April Hunt;  Location: [facility];  Service: Orthopedics;  Laterality: Right;   Patient Active Problem List   Diagnosis Date Noted   Acute stroke due to ischemia ([facility]) 04/03/2022   Lumbar degenerative disc disease 03/07/2022   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   Post-menopause atrophic vaginitis 02/08/2017   GERD without esophagitis 03/07/2016   Essential hypertension 11/16/2014   Osteoarthritis of right knee 12/26/2013   Mild intermittent asthma without complication 46/27/0350    ONSET DATE: 04/05/2022   REFERRING DIAG: Diagnosis R29.90 (ICD-10-CM) - Stroke-like symptoms   THERAPY  DIAG:  Hemiplegia and hemiparesis following cerebral infarction affecting right dominant side (HCC)  Other Hunt of coordination  Muscle weakness (generalized)  Unsteadiness on feet  Difficulty in walking, not elsewhere classified  Rationale for Evaluation and Treatment Rehabilitation  SUBJECTIVE:                                                                                                                                                                                        SUBJECTIVE STATEMENT: Patient reports no issues today.  PERTINENT HISTORY: 71 year old [place] comes to the hospital with complaints of GERD, HTN, HLD, asthma, history of lumbar radiculopathy status post laminectomy/fusion came to [facility] with complains of  right facial numbness and tingling along with right lower extremity weakness.  Neurology recommended admitting patient for stroke work-up.  CT of the head was negative.  She was started on aspirin and Plavix.  Patient was seen by neurology team.  A1c 5.4, UDS negative.  MRI brain was also negative, LDL was 155.  Right-sided facial symptoms resolved but right lower extremity weakness which persisted therefore lumbar spine was ordered.  MRI spine showed disc bulge with slight neurocompression.  Case was discussed with April. Christella Hunt from neurosurgery who recommended outpatient follow-up in 7 days of Decadron otherwise no acute surgical needs at this time. Later in the day had extensive discussion with patient regarding her care and her sister was present at bedside.  I explained to patient that she will need to follow-up outpatient with pain management for her lower back and right lower extremity pain and weakness, take 7 days of oral Decadron and get outpatient neurosurgery referral by her PCP. She also tells me that she tells me that she is not allergic to sulfa tells me that she is not allergic to aspirin as long as enteric-coated therefore willing to take aspirin and Plavix for 3 weeks followed by aspirin.  PAIN:  Are you having pain? Yes: NPRS scale: 5/10 Pain location: whole R side Pain description: cramping Aggravating factors: end of day Relieving factors: rest  PRECAUTIONS: None  WEIGHT BEARING RESTRICTIONS No  FALLS: Has patient fallen in last 6 months? No  LIVING ENVIRONMENT: Lives with: lives with their family Lives in: House/apartment Stairs: Yes: Internal: 12 steps; on right going up Has following equipment at home: Single point cane and Walker - 4 wheeled  PLOF: Independent  PATIENT GOALS Walk on her own, return to her normal routine.  OBJECTIVE:   DIAGNOSTIC FINDINGS: -CT head, MRI brain is negative for acute pathology.  CTA head and neck is also negative.  A1c 5.4, UDS  negative, LDL 155.  Suspicion still remains  for TIA.  A.  A.  Neurology recommending aspirin Plavix for 3 weeks followed by aspirin. -MRI lumbar spine showed bulging of the disc with nerve root compression.  Discussed with April April Hunt who reviewed her MRI- no surgical indication, ok for week long decadron and outptn follow up.  She follows at [facility] pain management for monthly epidural bupivacaine/Kenalog injections. --Echocardiogram normal ef  COGNITION: Overall cognitive status: Within functional limits for tasks assessed   SENSATION: Patient reports numbness on R face, arm and leg  COORDINATION: Mildly slow fingers to thumb   MUSCLE TONE: RLE: Within functional limits   MUSCLE LENGTH: Hamstrings: Right 50 deg; Left 80 deg  POSTURE: No Significant postural limitations  LOWER EXTREMITY ROM:    PROM grossly WFL, R HS tightness noted.  LOWER EXTREMITY MMT:  LLE WNL  MMT Right Eval   Hip flexion 2   Hip extension 2   Hip abduction 2   Hip adduction    Hip internal rotation    Hip external rotation    Knee flexion 2   Knee extension 2   Ankle dorsiflexion 2+   Ankle plantarflexion    Ankle inversion    Ankle eversion    (Blank rows = not tested)  BED MOBILITY: I   TRANSFERS: Assistive device utilized: Environmental consultant - 4 wheeled  Sit to stand: Modified independence Stand to sit: Modified independence Chair to chair: Modified independence  STAIRS:  Level of Assistance: Modified independence  Stair Negotiation Technique: Step to Pattern with Bilateral Rails  Number of Stairs: 12   Height of Stairs: 6  Comments: Per patient report, to access her home.  GAIT: Gait pattern: step to pattern, decreased arm swing- Right, decreased arm swing- Left, decreased step length- Right, decreased stance time- Right, Right hip hike, trendelenburg, lateral hip instability, and trunk flexed Distance walked: 29' Assistive device utilized: Environmental consultant - 4 wheeled Level of assistance: Modified  independence Comments: Patient moves very slow, approximately 2 minutes to travel 29'  FUNCTIONAL TESTs:  5 times sit to stand: 41.08 Timed up and go (TUG): TBD Berg Balance Scale: TBD (31 in inpatient)  TODAY'S TREATMENT:  05/26/22 NuStep L5 x 6 minutes Resisted gait against 20#, 3 reps each direction. Heel raise/Toe raise 2 x 10 reps, No UE support, CGA Ambulated 1 x 80' while chest pressing 2# WATE bar Backwards amb x 20' while chest pressing 2# WATE bar Walking on airex beams forward and back x 4 Side stepping on air ex beam R/Hunt x 4 each Placed R foot on Red physioball in sit, rolling forward and back, side to side x 10 each. Difficulty lifting to place it on ball, and needed Min a to control add Ionto patch to R lateral knee   05/23/22 NuStep L5 x 6 minutes Functional re-assessment performed, see goals for results. Ambulated with 5# weight in BUE to facilitate trunk activation and stability.  05/17/22 NuStep L5 x 6 minutes. LLE step taps onto 4" step, BUE support on RW for balance, CGA, 10 nreps RLE step ups onto 4" step with BUE support, encouraged to minimize BUE support 10 reps. Seated hip flexion, lifting R foot up onto 4" step x 10, increased effort required. Step ups onto Airex pad and off while holding 2# WATE bar in BUE. Lateral steps ups onto and off Airex pad holding 2# WATE bar, occasional min A for balance. Heel raises while holding 2# WATE bar, 2 x 10 Ambulated while holding 2# WATE bar-forward, back and  B side step, turns, CGA, total of approximately 120'. No unsteadiness noted. Ionto to R lateral knee  05/15/22 NuStep L4 with U and LE x 6 minutes Sit to stand x 5 from progressively lower surface, no UE support. Patient reported mild Hunt medial knee pain, but did not fall into IR TKE in stand, 10 each, Green Tband Side to side step against green Tband at knees, 10 each way. Mini tramp- jump feet out/in and alternating stride, 2 x 5 reps each way. With rest  breaks. Walking while holding 3# wate bar. Chest press while walking. 107' Iontophoresis to Hunt medial knee.   05/12/22 Walking with 2# WATE bar, 2 x 25', CGA, stp through gait. Standing hold 2# WATE Bar- forward lunges x 8 with each leg, then lateral lunges x 8 with each leg. NuStep L3 x 6 minutes, U and LE Paloff press with R side closest to weight 5#, 10 reps. Mini tramp jumping then jump abd/add. Able speed up movements with repetition, 3 x 5 sets with Bue support and CGA with belt.  05/10/22 Nustep L1-> progressing to L4 x5 minutes R LE only (unable to push with R LE when both legs were on the machine, but able to perform with R LE alone on Nustep)  -Step up onto 4 inch step with BUEs x5 R Mod verbal cues for lifting R LE high enough  -Toe taps on 8 inch step with Hunt LE while maintaining stance R LE U UE support x10  -Standing hip ABD x10 BLEs  - LAQs 3# 1x10 Hunt LE unable R LE even with 0# - seated mini-marches R LE 0#  1x10 - attempted tandem stance in bars- declined to perform without U UE support  - B heel and toe raises BUE support x10 each  - gait training in // bars with U UE progressing to one finger x2 full laps  - side steps in // bars 2 full laps two finger support   05/05/22 NuStep L3 x 6 minutes Supine-RLE press into red physio ball while lifting LLE. Required mod facilitation initially, improved to light min Standing-Step back and out with LLE, turn back and reach with LUE to touch the target behind. 8 times to Hunt and 5 times to R. Required increased A turning to R and mod facilitation to weight shift onto the leg once turned. Gait training with shopping cart, 1 x 200', patient initally with more normalized gait pattern, but as she progressed she started dragging R foot more. Standing at 4" step, place R foot on step. Step ups with BUE support. Improved in her R activation and use with repetition.  Placed Hunt foot on step, R knee flex/ext.  05/03/22 Nustep L3 x54mns  STS w/red  ball push out, CGA 2x5 Calf raises holding on to rollator 2x10  Step ups 4" up with Hunt, difficulty picking up R   Walking with weighted shopping cart  [facility]  04/27/22 Nustep level 3 x 6 minutes Ball b/n knees squeeze LAQ on the left 2.5#, AAROM LAQ on the right Right ankle red tband PF, AAROM DF right ankle Marching on the left 2.5#, right AAROM Stairs 6" and 4" up and down with CGA, one at a time Supine feet on ball K2C, trunk rotation, small bridges, isometric abs SAQ with left leg assist 3x5  04/25/22 NuStep at L2 x 5 minutes, required Vc and TC to release LLE when attempting to push with R TUG-23.34 BERG 37  Seated knee flexion and ext with R foot on towel to decrease friction-very slow and labored in both directions, x 3 Seated heel raise/toe raise Standing stepping forward and back. Sit to/from stand x 5  PATIENT EDUCATION: Education details: results of re-assessment Person educated: Patient Education method: Explanation and Demonstration Education comprehension: verbalized understanding and returned demonstration   HOME EXERCISE PROGRAM: Verbal-hip flexion, knee ext, ankle DF/PF-all in sitting. Consciously use RLE in all activities as much as possible.  [id number]  GOALS: Goals reviewed with patient? Yes  SHORT TERM GOALS: Target date: 05/23/2022  I with basic HEP Baseline: Goal status: met  2.  5x STS in < 24 seconds Baseline: 41,  05/23/22-19.09 Goal status: met  LONG TERM GOALS: Target date: 07/14/2022  I with final HEP Baseline:  Goal status: ongoing  2.  5 x STS in < 15 seconds Baseline: 41, 05/23/22-19.09 Goal status:ongoing  3.  Increase RLE strength to at least 4-/5 throughout Baseline: 2/5, 3-/5-8/25, 05/23/22-3-/5 with MMT Goal status: ongoing  4.  Paitent will ambulate x at least 400' with LRAD, MI, minimized gait deviations, functional speed. Baseline: 05/23/22-300', Rollator, mildly slow, fatigued Goal status: ongoing  5.   Patient will score at least 40/56 on BERG balance assessment. Baseline: 31/56 in hospital, 8/8-37, 05/23/22-42/56 Goal status: met  6.  Paitent will complete TUG in < 15 sec with LRAD. Baseline: 23.34, 05/23/22-13.38 Goal status: met   ASSESSMENT:  CLINICAL IMPRESSION: Paitent reports overall improvement. Treatmetn challenged both strength and control of RLE. She is much more stable in stance without AD, continues to have difficulty with hip flexion.   OBJECTIVE IMPAIRMENTS Abnormal gait, decreased activity tolerance, decreased balance, decreased coordination, decreased endurance, decreased mobility, difficulty walking, decreased strength, impaired UE functional use, improper body mechanics, postural dysfunction, and pain.   ACTIVITY LIMITATIONS carrying, lifting, bending, standing, squatting, sleeping, stairs, transfers, bathing, toileting, dressing, reach over head, hygiene/grooming, locomotion level, and caring for others  PARTICIPATION LIMITATIONS: meal prep, cleaning, laundry, shopping, and community activity  PERSONAL FACTORS Age are also affecting patient's functional outcome.   REHAB POTENTIAL: Good  CLINICAL DECISION MAKING: Evolving/moderate complexity  EVALUATION COMPLEXITY: Moderate  PLAN: PT FREQUENCY: 2x/week  PT DURATION: 12 weeks  PLANNED INTERVENTIONS: Therapeutic exercises, Therapeutic activity, Neuromuscular re-education, Balance training, Gait training, Patient/Family education, Self Care, Joint mobilization, Stair training, Ionotophoresis 41m/ml Dexamethasone, and Manual therapy  PLAN FOR NEXT SESSION: Strengthening for RLE, stair training, balance with narrow BOS, speed of movement.    SEthel RanaDPT 05/26/22 11:50 AM  05/26/2022, 11:50 AM

## 2022-06-01 ENCOUNTER — Encounter: Payer: Self-pay | Admitting: Physical Therapy

## 2022-06-01 ENCOUNTER — Ambulatory Visit: Payer: Medicare Other | Admitting: Physical Therapy

## 2022-06-01 DIAGNOSIS — I69351 Hemiplegia and hemiparesis following cerebral infarction affecting right dominant side: Secondary | ICD-10-CM | POA: Diagnosis not present

## 2022-06-01 DIAGNOSIS — R2681 Unsteadiness on feet: Secondary | ICD-10-CM

## 2022-06-01 DIAGNOSIS — R278 Other lack of coordination: Secondary | ICD-10-CM

## 2022-06-01 DIAGNOSIS — R262 Difficulty in walking, not elsewhere classified: Secondary | ICD-10-CM

## 2022-06-01 DIAGNOSIS — M6281 Muscle weakness (generalized): Secondary | ICD-10-CM

## 2022-06-01 NOTE — Therapy (Signed)
OUTPATIENT PHYSICAL THERAPY NEURO TREATMENT Progress Note Reporting Period 04/21/22 to 05/23/22  See note below for Objective Data and Assessment of Progress/Goals.      Patient Name: April Hunt MRN: [id number] DOB:06-Feb-1951, 71 y.o., female Today's Date: 06/01/2022   PCP: April Hunt, April Hunt REFERRING PROVIDER: Damita Lack, MD    PT End of Session - 06/01/22 0801     Visit Number 12    Date for PT Re-Evaluation 07/14/22    PT Start Time 0800    PT Stop Time 0842    PT Time Calculation (min) 42 min    Activity Tolerance Patient tolerated treatment well    Behavior During Therapy [facility] for tasks assessed/performed                       Past Medical History:  Diagnosis Date   Allergy    seasonal   Arthritis    Asthma 12/25/2011   Asthma 12/25/2011   Formatting of this note might be different from the original. Last Assessment & Plan:  Currently stable with PRN albuterol.   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   Pure hypercholesterolemia    Refusal of blood transfusions as patient is Jehovah's Witness    Sinusitis 03/04/2013   Tinea pedis  03/19/2012   Tinea versicolor 03/19/2012   Past Surgical History:  Procedure Laterality Date   ABDOMINAL HYSTERECTOMY  1998   Still has cervix and both ovaries   APPENDECTOMY     BACK SURGERY     JOINT REPLACEMENT     scope bil knees   KNEE ARTHROSCOPY  1997   right knee   Lymph node resection  1999   benign   MENISCUS REPAIR  1995   left knee   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   DR CAFFREY   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   Procedure: RIGHT TOTAL KNEE ARTHROPLASTY;  Surgeon: April Hunt., MD;  Location: [place];  Service: Orthopedics;  Laterality: Right;   Patient Active Problem List   Diagnosis Date Noted   Acute stroke due to ischemia ([place]) 04/03/2022   Lumbar degenerative disc disease 03/07/2022   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   Post-menopause atrophic vaginitis 02/08/2017   GERD without esophagitis 03/07/2016   Essential hypertension 11/16/2014   Osteoarthritis of right knee 12/26/2013   Mild intermittent asthma without complication 43/32/9518    ONSET DATE: 04/05/2022   REFERRING DIAG: Diagnosis R29.90 (ICD-10-CM) - Stroke-like symptoms   THERAPY DIAG:  Hemiplegia and hemiparesis following cerebral infarction  affecting right dominant side (HCC)  Other Hunt of coordination  Muscle weakness (generalized)  Unsteadiness on feet  Difficulty in walking, not elsewhere classified  Rationale for Evaluation and Treatment Rehabilitation  SUBJECTIVE:                                                                                                                                                                                        SUBJECTIVE STATEMENT: Patient reports no issues today. Her brother has been in the hospital, so her focus has been distracted this week.  PERTINENT HISTORY: 71 year old Dresser comes to the hospital with complaints of GERD, HTN, HLD, asthma, history of lumbar radiculopathy status post laminectomy/fusion  came to [facility] with complains of right facial numbness and tingling along with right lower extremity weakness.  Neurology recommended admitting patient for stroke work-up.  CT of the head was negative.  She was started on aspirin and Plavix.  Patient was seen by neurology team.  A1c 5.4, UDS negative.  MRI brain was also negative, LDL was 155.  Right-sided facial symptoms resolved but right lower extremity weakness which persisted therefore lumbar spine was ordered.  MRI spine showed disc bulge with slight neurocompression.  Case was discussed with Dr. Christella Noa from neurosurgery who recommended outpatient follow-up in 7 days of Decadron otherwise no acute surgical needs at this time. Later in the day had extensive discussion with patient regarding her care and her sister was present at bedside.  I explained to patient that she will need to follow-up outpatient with pain management for her lower back and right lower extremity pain and weakness, take 7 days of oral Decadron and get outpatient neurosurgery referral by her PCP. She also tells me that she tells me that she is not allergic to sulfa tells me that she is not allergic to aspirin as long as enteric-coated therefore willing to take aspirin and Plavix for 3 weeks followed by aspirin.  PAIN:  Are you having pain? Yes: NPRS scale: 5/10 Pain location: whole R side Pain description: cramping Aggravating factors: end of day Relieving factors: rest  PRECAUTIONS: None  WEIGHT BEARING RESTRICTIONS No  FALLS: Has patient fallen in last 6 months? No  LIVING ENVIRONMENT: Lives with: lives with their family Lives in: House/apartment Stairs: Yes: Internal: 12 steps; on right going up Has following equipment at home: Single point cane and Walker - 4 wheeled  PLOF: Independent  PATIENT GOALS Walk on her own, return to her normal routine.  OBJECTIVE:   DIAGNOSTIC FINDINGS: -CT head, MRI brain is negative for acute pathology.  CTA head and neck is  also negative.  A1c 5.4, UDS  negative, LDL 155.  Suspicion still remains for TIA.  A.  A.  Neurology recommending aspirin Plavix for 3 weeks followed by aspirin. -MRI lumbar spine showed bulging of the disc with nerve root compression.  Discussed with Dr Christella Noa who reviewed her MRI- no surgical indication, ok for week long decadron and outptn follow up.  She follows at [facility] pain management for monthly epidural bupivacaine/Kenalog injections. --Echocardiogram normal ef  COGNITION: Overall cognitive status: Within functional limits for tasks assessed   SENSATION: Patient reports numbness on R face, arm and leg  COORDINATION: Mildly slow fingers to thumb   MUSCLE TONE: RLE: Within functional limits   MUSCLE LENGTH: Hamstrings: Right 50 deg; Left 80 deg  POSTURE: No Significant postural limitations  LOWER EXTREMITY ROM:    PROM grossly WFL, R HS tightness noted.  LOWER EXTREMITY MMT:  LLE WNL  MMT Right Eval   Hip flexion 2   Hip extension 2   Hip abduction 2   Hip adduction    Hip internal rotation    Hip external rotation    Knee flexion 2   Knee extension 2   Ankle dorsiflexion 2+   Ankle plantarflexion    Ankle inversion    Ankle eversion    (Blank rows = not tested)  BED MOBILITY: I   TRANSFERS: Assistive device utilized: Environmental consultant - 4 wheeled  Sit to stand: Modified independence Stand to sit: Modified independence Chair to chair: Modified independence  STAIRS:  Level of Assistance: Modified independence  Stair Negotiation Technique: Step to Pattern with Bilateral Rails  Number of Stairs: 12   Height of Stairs: 6  Comments: Per patient report, to access her home.  GAIT: Gait pattern: step to pattern, decreased arm swing- Right, decreased arm swing- Left, decreased step length- Right, decreased stance time- Right, Right hip hike, trendelenburg, lateral hip instability, and trunk flexed Distance walked: 42' Assistive device utilized: Environmental consultant - 4  wheeled Level of assistance: Modified independence Comments: Patient moves very slow, approximately 2 minutes to travel 74'  FUNCTIONAL TESTs:  5 times sit to stand: 41.08 Timed up and go (TUG): TBD Berg Balance Scale: TBD (31 in inpatient)  TODAY'S TREATMENT:  06/01/22 NuStep L5 x 6 minutes Stair climbing, had difficulty with R hip flexion to step up with RLE Standing R hip flexion, slide pillowcase forward, then lift foot into hip flexion, slide back. Mini squats in Parallel bars, most weight on R, 10 reps Heel raises with most weight on R, 10 reps Walk with 3# WATE bar, x 150', improved step length. Shuttle walk 10 x 15', moving cones from one surface to the other-much improved gait when she was distracted.  05/26/22 NuStep L5 x 6 minutes Resisted gait against 20#, 3 reps each direction. Heel raise/Toe raise 2 x 10 reps, No UE support, CGA Ambulated 1 x 80' while chest pressing 2# WATE bar Backwards amb x 20' while chest pressing 2# WATE bar Walking on airex beams forward and back x 4 Side stepping on air ex beam R/Hunt x 4 each Placed R foot on Red physioball in sit, rolling forward and back, side to side x 10 each. Difficulty lifting to place it on ball, and needed Min a to control add Ionto patch to R lateral knee   05/23/22 NuStep L5 x 6 minutes Functional re-assessment performed, see goals for results. Ambulated with 5# weight in BUE to facilitate trunk activation and stability.  05/17/22 NuStep L5 x 6 minutes. LLE step taps onto  4" step, BUE support on RW for balance, CGA, 10 nreps RLE step ups onto 4" step with BUE support, encouraged to minimize BUE support 10 reps. Seated hip flexion, lifting R foot up onto 4" step x 10, increased effort required. Step ups onto Airex pad and off while holding 2# WATE bar in BUE. Lateral steps ups onto and off Airex pad holding 2# WATE bar, occasional min A for balance. Heel raises while holding 2# WATE bar, 2 x 10 Ambulated while holding  2# WATE bar-forward, back and B side step, turns, CGA, total of approximately 120'. No unsteadiness noted. Ionto to R lateral knee  05/15/22 NuStep L4 with U and LE x 6 minutes Sit to stand x 5 from progressively lower surface, no UE support. Patient reported mild Hunt medial knee pain, but did not fall into IR TKE in stand, 10 each, Green Tband Side to side step against green Tband at knees, 10 each way. Mini tramp- jump feet out/in and alternating stride, 2 x 5 reps each way. With rest breaks. Walking while holding 3# wate bar. Chest press while walking. 84' Iontophoresis to Hunt medial knee.   05/12/22 Walking with 2# WATE bar, 2 x 25', CGA, stp through gait. Standing hold 2# WATE Bar- forward lunges x 8 with each leg, then lateral lunges x 8 with each leg. NuStep L3 x 6 minutes, U and LE Paloff press with R side closest to weight 5#, 10 reps. Mini tramp jumping then jump abd/add. Able speed up movements with repetition, 3 x 5 sets with Bue support and CGA with belt.  05/10/22 Nustep L1-> progressing to L4 x5 minutes R LE only (unable to push with R LE when both legs were on the machine, but able to perform with R LE alone on Nustep)  -Step up onto 4 inch step with BUEs x5 R Mod verbal cues for lifting R LE high enough  -Toe taps on 8 inch step with Hunt LE while maintaining stance R LE U UE support x10  -Standing hip ABD x10 BLEs  - LAQs 3# 1x10 Hunt LE unable R LE even with 0# - seated mini-marches R LE 0#  1x10 - attempted tandem stance in bars- declined to perform without U UE support  - B heel and toe raises BUE support x10 each  - gait training in // bars with U UE progressing to one finger x2 full laps  - side steps in // bars 2 full laps two finger support   05/05/22 NuStep L3 x 6 minutes Supine-RLE press into red physio ball while lifting LLE. Required mod facilitation initially, improved to light min Standing-Step back and out with LLE, turn back and reach with LUE to touch the target  behind. 8 times to Hunt and 5 times to R. Required increased A turning to R and mod facilitation to weight shift onto the leg once turned. Gait training with shopping cart, 1 x 200', patient initally with more normalized gait pattern, but as she progressed she started dragging R foot more. Standing at 4" step, place R foot on step. Step ups with BUE support. Improved in her R activation and use with repetition.  Placed Hunt foot on step, R knee flex/ext.  05/03/22 Nustep L3 x54mns  STS w/red ball push out, CGA 2x5 Calf raises holding on to rollator 2x10  Step ups 4" up with Hunt, difficulty picking up R   Walking with weighted shopping cart  [facility]  04/27/22 Nustep level 3 x 6 minutes Ball b/n knees squeeze LAQ on the left 2.5#, AAROM LAQ on the right Right ankle red tband PF, AAROM DF right ankle Marching on the left 2.5#, right AAROM Stairs 6" and 4" up and down with CGA, one at a time Supine feet on ball K2C, trunk rotation, small bridges, isometric abs SAQ with left leg assist 3x5  04/25/22 NuStep at L2 x 5 minutes, required Vc and TC to release LLE when attempting to push with R TUG-23.34 BERG 37 Seated knee flexion and ext with R foot on towel to decrease friction-very slow and labored in both directions, x 3 Seated heel raise/toe raise Standing stepping forward and back. Sit to/from stand x 5  PATIENT EDUCATION: Education details: results of re-assessment Person educated: Patient Education method: Explanation and Demonstration Education comprehension: verbalized understanding and returned demonstration   HOME EXERCISE PROGRAM: Verbal-hip flexion, knee ext, ankle DF/PF-all in sitting. Consciously use RLE in all activities as much as possible.  [id number]  GOALS: Goals reviewed with patient? Yes  SHORT TERM GOALS: Target date: 05/23/2022  I with basic HEP Baseline: Goal status: met  2.  5x STS in < 24 seconds Baseline: 41,  05/23/22-19.09 Goal status:  met  LONG TERM GOALS: Target date: 07/14/2022  I with final HEP Baseline:  Goal status: ongoing  2.  5 x STS in < 15 seconds Baseline: 41, 05/23/22-19.09 Goal status:ongoing  3.  Increase RLE strength to at least 4-/5 throughout Baseline: 2/5, 3-/5-8/25, 05/23/22-3-/5 with MMT Goal status: ongoing  4.  Paitent will ambulate x at least 400' with LRAD, MI, minimized gait deviations, functional speed. Baseline: 05/23/22-300', Rollator, mildly slow, fatigued Goal status: ongoing  5.  Patient will score at least 40/56 on BERG balance assessment. Baseline: 31/56 in hospital, 8/8-37, 05/23/22-42/56 Goal status: met  6.  Paitent will complete TUG in < 15 sec with LRAD. Baseline: 23.34, 05/23/22-13.38 Goal status: met   ASSESSMENT:  CLINICAL IMPRESSION: Paitent continues to improve in strength. R hip flexion still impaired. Treatment focused on strength and gait, HEP updated.   OBJECTIVE IMPAIRMENTS Abnormal gait, decreased activity tolerance, decreased balance, decreased coordination, decreased endurance, decreased mobility, difficulty walking, decreased strength, impaired UE functional use, improper body mechanics, postural dysfunction, and pain.   ACTIVITY LIMITATIONS carrying, lifting, bending, standing, squatting, sleeping, stairs, transfers, bathing, toileting, dressing, reach over head, hygiene/grooming, locomotion level, and caring for others  PARTICIPATION LIMITATIONS: meal prep, cleaning, laundry, shopping, and community activity  PERSONAL FACTORS Age are also affecting patient's functional outcome.   REHAB POTENTIAL: Good  CLINICAL DECISION MAKING: Evolving/moderate complexity  EVALUATION COMPLEXITY: Moderate  PLAN: PT FREQUENCY: 2x/week  PT DURATION: 12 weeks  PLANNED INTERVENTIONS: Therapeutic exercises, Therapeutic activity, Neuromuscular re-education, Balance training, Gait training, Patient/Family education, Self Care, Joint mobilization, Stair training,  Ionotophoresis 65m/ml Dexamethasone, and Manual therapy  PLAN FOR NEXT SESSION: Strengthening for RLE, stair training, balance with narrow BOS, speed of movement.    SEthel RanaDPT 06/01/22 9:29 AM  06/01/2022, 9:29 AM

## 2022-06-05 ENCOUNTER — Ambulatory Visit: Payer: Medicare Other | Admitting: Physical Therapy

## 2022-06-07 ENCOUNTER — Ambulatory Visit: Payer: Medicare Other | Admitting: Physical Therapy

## 2022-06-12 ENCOUNTER — Ambulatory Visit: Payer: Medicare Other | Admitting: Physical Therapy

## 2022-06-12 ENCOUNTER — Encounter: Payer: Self-pay | Admitting: Physical Therapy

## 2022-06-12 DIAGNOSIS — R278 Other lack of coordination: Secondary | ICD-10-CM

## 2022-06-12 DIAGNOSIS — I69351 Hemiplegia and hemiparesis following cerebral infarction affecting right dominant side: Secondary | ICD-10-CM | POA: Diagnosis not present

## 2022-06-12 DIAGNOSIS — M6281 Muscle weakness (generalized): Secondary | ICD-10-CM

## 2022-06-12 DIAGNOSIS — R2681 Unsteadiness on feet: Secondary | ICD-10-CM

## 2022-06-12 DIAGNOSIS — R262 Difficulty in walking, not elsewhere classified: Secondary | ICD-10-CM

## 2022-06-12 NOTE — Therapy (Signed)
OUTPATIENT PHYSICAL THERAPY NEURO TREATMENT Progress Note Reporting Period 04/21/22 to 05/23/22  See note below for Objective Data and Assessment of Progress/Goals.      Patient Name: April Hunt MRN: [id number] DOB:08-Mar-1951, 71 y.o., female Today's Date: 06/12/2022   PCP: Ronnald Ramp, [name] REFERRING PROVIDER: Damita Lack, MD    PT End of Session - 06/12/22 1152     Visit Number 13    Date for PT Re-Evaluation 07/14/22    PT Start Time 1146    PT Stop Time 1225    PT Time Calculation (min) 39 min    Activity Tolerance Patient tolerated treatment well    Behavior During Therapy [facility] for tasks assessed/performed                        Past Medical History:  Diagnosis Date   Allergy    seasonal   Arthritis    Asthma 12/25/2011   Asthma 12/25/2011   Formatting of this note might be different from the original. Last Assessment & Plan:  Currently stable with PRN albuterol.   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   Pure hypercholesterolemia    Refusal of blood transfusions as patient is Jehovah's Witness    Sinusitis 03/04/2013   Tinea  pedis 03/19/2012   Tinea versicolor 03/19/2012   Past Surgical History:  Procedure Laterality Date   ABDOMINAL HYSTERECTOMY  1998   Still has cervix and both ovaries   APPENDECTOMY     BACK SURGERY     JOINT REPLACEMENT     scope bil knees   KNEE ARTHROSCOPY  1997   right knee   Lymph node resection  1999   benign   MENISCUS REPAIR  1995   left knee   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   DR CAFFREY   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   Procedure: RIGHT TOTAL KNEE ARTHROPLASTY;  Surgeon: Yvette Rack., MD;  Location: [facility];  Service: Orthopedics;  Laterality: Right;   Patient Active Problem List   Diagnosis Date Noted   Acute stroke due to ischemia ([place]) 04/03/2022   Lumbar degenerative disc disease 03/07/2022   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   Post-menopause atrophic vaginitis 02/08/2017   GERD without esophagitis 03/07/2016   Essential hypertension 11/16/2014   Osteoarthritis of right knee 12/26/2013   Mild intermittent asthma without complication 56/38/7564    ONSET DATE: 04/05/2022   REFERRING DIAG: Diagnosis R29.90 (ICD-10-CM) - Stroke-like symptoms   THERAPY DIAG:  Hemiplegia and hemiparesis following cerebral  infarction affecting right dominant side (HCC)  Other lack of coordination  Muscle weakness (generalized)  Unsteadiness on feet  Difficulty in walking, not elsewhere classified  Rationale for Evaluation and Treatment Rehabilitation  SUBJECTIVE:                                                                                                                                                                                        SUBJECTIVE STATEMENT: Patient reports no issues today. She had an episode of vertigo and missed her previous treatment, but it is mostly resolved now.  PERTINENT HISTORY: 71 year old Henning comes to the hospital with complaints of GERD, HTN, HLD, asthma, history of lumbar radiculopathy status post  laminectomy/fusion came to [facility] with complains of right facial numbness and tingling along with right lower extremity weakness.  Neurology recommended admitting patient for stroke work-up.  CT of the head was negative.  She was started on aspirin and Plavix.  Patient was seen by neurology team.  A1c 5.4, UDS negative.  MRI brain was also negative, LDL was 155.  Right-sided facial symptoms resolved but right lower extremity weakness which persisted therefore lumbar spine was ordered.  MRI spine showed disc bulge with slight neurocompression.  Case was discussed with Dr. Christella Noa from neurosurgery who recommended outpatient follow-up in 7 days of Decadron otherwise no acute surgical needs at this time. Later in the day had extensive discussion with patient regarding her care and her sister was present at bedside.  I explained to patient that she will need to follow-up outpatient with pain management for her lower back and right lower extremity pain and weakness, take 7 days of oral Decadron and get outpatient neurosurgery referral by her PCP. She also tells me that she tells me that she is not allergic to sulfa tells me that she is not allergic to aspirin as long as enteric-coated therefore willing to take aspirin and Plavix for 3 weeks followed by aspirin.  PAIN:  Are you having pain? Yes: NPRS scale: 5/10 Pain location: whole R side Pain description: cramping Aggravating factors: end of day Relieving factors: rest  PRECAUTIONS: None  WEIGHT BEARING RESTRICTIONS No  FALLS: Has patient fallen in last 6 months? No  LIVING ENVIRONMENT: Lives with: lives with their family Lives in: House/apartment Stairs: Yes: Internal: 12 steps; on right going up Has following equipment at home: Single point cane and Walker - 4 wheeled  PLOF: Independent  PATIENT GOALS Walk on her own, return to her normal routine.  OBJECTIVE:   DIAGNOSTIC FINDINGS: -CT head, MRI brain is negative for acute pathology.  CTA  head and neck is also negative.  A1c 5.4, UDS negative, LDL 155.  Suspicion still remains for TIA.  A.  A.  Neurology recommending aspirin Plavix for 3 weeks followed by aspirin. -MRI lumbar spine showed bulging of the disc with nerve root compression.  Discussed with Dr Christella Noa who reviewed her MRI- no surgical indication, ok for week long decadron and outptn follow up.  She follows at [facility] pain management for monthly epidural bupivacaine/Kenalog injections. --Echocardiogram normal ef  COGNITION: Overall cognitive status: Within functional limits for tasks assessed   SENSATION: Patient reports numbness on R face, arm and leg  COORDINATION: Mildly slow fingers to thumb   MUSCLE TONE: RLE: Within functional limits   MUSCLE LENGTH: Hamstrings: Right 50 deg; Left 80 deg  POSTURE: No Significant postural limitations  LOWER EXTREMITY ROM:    PROM grossly WFL, R HS tightness noted.  LOWER EXTREMITY MMT:  LLE WNL  MMT Right Eval   Hip flexion 2   Hip extension 2   Hip abduction 2   Hip adduction    Hip internal rotation    Hip external rotation    Knee flexion 2   Knee extension 2   Ankle dorsiflexion 2+   Ankle plantarflexion    Ankle inversion    Ankle eversion    (Blank rows = not tested)  BED MOBILITY: I   TRANSFERS: Assistive device utilized: Environmental consultant - 4 wheeled  Sit to stand: Modified independence Stand to sit: Modified independence Chair to chair: Modified independence  STAIRS:  Level of Assistance: Modified independence  Stair Negotiation Technique: Step to Pattern with Bilateral Rails  Number of Stairs: 12   Height of Stairs: 6  Comments: Per patient report, to access her home.  GAIT: Gait pattern: step to pattern, decreased arm swing- Right, decreased arm swing- Left, decreased step length- Right, decreased stance time- Right, Right hip hike, trendelenburg, lateral hip instability, and trunk flexed Distance walked: 49' Assistive device utilized:  Environmental consultant - 4 wheeled Level of assistance: Modified independence Comments: Patient moves very slow, approximately 2 minutes to travel 75'  FUNCTIONAL TESTs:  5 times sit to stand: 41.08 Timed up and go (TUG): TBD Berg Balance Scale: TBD (31 in inpatient)  TODAY'S TREATMENT:  06/12/22 NuStep L5 x 6 minutes Standing R hip flexion, slide pillowcase forward, then lift foot into hip flexion, slide back. On treadmill, L foot on the side of the belt, BUE support. Push belt backwards and forwards with RLE, required mod A, 5 reps each way. Resisted step up with 4" step for RLE. 10 reps. Improved when she increased her speed of movement. Ambulated 1 x 100' without AD, no unsteadiness Stepping in box over X on the floor with Tband, clockwise, counterclockwise, changing directions upon therapist command, no unsteadiness noted. Ambulated outdoors x 500' without AD, Occasional CGA on unlevel surface, no LOB   06/01/22 NuStep L5 x 6 minutes Stair climbing, had difficulty with R hip flexion to step up with RLE Standing R hip flexion, slide pillowcase forward, then lift foot into hip flexion, slide back. Mini squats in Parallel bars, most weight on R, 10 reps Heel raises with most weight on R, 10 reps Walk with 3# WATE bar, x 150', improved step length. Shuttle walk 10 x 15', moving cones from one surface to the other-much improved gait when she was distracted.  05/26/22 NuStep L5 x 6 minutes Resisted gait against 20#, 3 reps each direction. Heel raise/Toe raise 2 x 10 reps, No UE support, CGA Ambulated 1 x 80'  while chest pressing 2# WATE bar Backwards amb x 20' while chest pressing 2# WATE bar Walking on airex beams forward and back x 4 Side stepping on air ex beam R/L x 4 each Placed R foot on Red physioball in sit, rolling forward and back, side to side x 10 each. Difficulty lifting to place it on ball, and needed Min a to control add Ionto patch to R lateral knee   05/23/22 NuStep L5 x 6  minutes Functional re-assessment performed, see goals for results. Ambulated with 5# weight in BUE to facilitate trunk activation and stability.  05/17/22 NuStep L5 x 6 minutes. LLE step taps onto 4" step, BUE support on RW for balance, CGA, 10 nreps RLE step ups onto 4" step with BUE support, encouraged to minimize BUE support 10 reps. Seated hip flexion, lifting R foot up onto 4" step x 10, increased effort required. Step ups onto Airex pad and off while holding 2# WATE bar in BUE. Lateral steps ups onto and off Airex pad holding 2# WATE bar, occasional min A for balance. Heel raises while holding 2# WATE bar, 2 x 10 Ambulated while holding 2# WATE bar-forward, back and B side step, turns, CGA, total of approximately 120'. No unsteadiness noted. Ionto to R lateral knee  05/15/22 NuStep L4 with U and LE x 6 minutes Sit to stand x 5 from progressively lower surface, no UE support. Patient reported mild L medial knee pain, but did not fall into IR TKE in stand, 10 each, Green Tband Side to side step against green Tband at knees, 10 each way. Mini tramp- jump feet out/in and alternating stride, 2 x 5 reps each way. With rest breaks. Walking while holding 3# wate bar. Chest press while walking. 93' Iontophoresis to L medial knee.   05/12/22 Walking with 2# WATE bar, 2 x 25', CGA, stp through gait. Standing hold 2# WATE Bar- forward lunges x 8 with each leg, then lateral lunges x 8 with each leg. NuStep L3 x 6 minutes, U and LE Paloff press with R side closest to weight 5#, 10 reps. Mini tramp jumping then jump abd/add. Able speed up movements with repetition, 3 x 5 sets with Bue support and CGA with belt.  05/10/22 Nustep L1-> progressing to L4 x5 minutes R LE only (unable to push with R LE when both legs were on the machine, but able to perform with R LE alone on Nustep)  -Step up onto 4 inch step with BUEs x5 R Mod verbal cues for lifting R LE high enough  -Toe taps on 8 inch step with  L LE while maintaining stance R LE U UE support x10  -Standing hip ABD x10 BLEs  - LAQs 3# 1x10 L LE unable R LE even with 0# - seated mini-marches R LE 0#  1x10 - attempted tandem stance in bars- declined to perform without U UE support  - B heel and toe raises BUE support x10 each  - gait training in // bars with U UE progressing to one finger x2 full laps  - side steps in // bars 2 full laps two finger support   05/05/22 NuStep L3 x 6 minutes Supine-RLE press into red physio ball while lifting LLE. Required mod facilitation initially, improved to light min Standing-Step back and out with LLE, turn back and reach with LUE to touch the target behind. 8 times to L and 5 times to R. Required increased A turning to R and mod  facilitation to weight shift onto the leg once turned. Gait training with shopping cart, 1 x 200', patient initally with more normalized gait pattern, but as she progressed she started dragging R foot more. Standing at 4" step, place R foot on step. Step ups with BUE support. Improved in her R activation and use with repetition.  Placed L foot on step, R knee flex/ext.  05/03/22 Nustep L3 x50mns  STS w/red ball push out, CGA 2x5 Calf raises holding on to rollator 2x10  Step ups 4" up with L, difficulty picking up R   Walking with weighted shopping cart  [facility] steps [facility]  04/27/22 Nustep level 3 x 6 minutes Ball b/n knees squeeze LAQ on the left 2.5#, AAROM LAQ on the right Right ankle red tband PF, AAROM DF right ankle Marching on the left 2.5#, right AAROM Stairs 6" and 4" up and down with CGA, one at a time Supine feet on ball K2C, trunk rotation, small bridges, isometric abs SAQ with left leg assist 3x5  04/25/22 NuStep at L2 x 5 minutes, required Vc and TC to release LLE when attempting to push with R TUG-23.34 BERG 37 Seated knee flexion and ext with R foot on towel to decrease friction-very slow and labored in both directions, x 3 Seated heel  raise/toe raise Standing stepping forward and back. Sit to/from stand x 5  PATIENT EDUCATION: Education details: results of re-assessment Person educated: Patient Education method: Explanation and Demonstration Education comprehension: verbalized understanding and returned demonstration   HOME EXERCISE PROGRAM: Verbal-hip flexion, knee ext, ankle DF/PF-all in sitting. Consciously use RLE in all activities as much as possible.  [id number] GOALS: Goals reviewed with patient? Yes  SHORT TERM GOALS: Target date: 05/23/2022  I with basic HEP Baseline: Goal status: met  2.  5x STS in < 24 seconds Baseline: 41,  05/23/22-19.09 Goal status: met  LONG TERM GOALS: Target date: 07/14/2022  I with final HEP Baseline:  Goal status: ongoing  2.  5 x STS in < 15 seconds Baseline: 41, 05/23/22-19.09 Goal status:ongoing  3.  Increase RLE strength to at least 4-/5 throughout Baseline: 2/5, 3-/5-8/25, 05/23/22-3-/5 with MMT Goal status: ongoing  4.  Paitent will ambulate x at least 400' with LRAD, MI, minimized gait deviations, functional speed. Baseline: 05/23/22-300', Rollator, mildly slow, fatigued Goal status: ongoing  5.  Patient will score at least 40/56 on BERG balance assessment. Baseline: 31/56 in hospital, 8/8-37, 05/23/22-42/56 Goal status: met  6.  Paitent will complete TUG in < 15 sec with LRAD. Baseline: 23.34, 05/23/22-13.38 Goal status: met   ASSESSMENT:  CLINICAL IMPRESSION: Paitent continues to improve in strength. Treatment focused on challenging R hip flexors with multiple strengthening activities. She demonstrates improved hip flexion when she increases her speed of movement.  OBJECTIVE IMPAIRMENTS Abnormal gait, decreased activity tolerance, decreased balance, decreased coordination, decreased endurance, decreased mobility, difficulty walking, decreased strength, impaired UE functional use, improper body mechanics, postural dysfunction, and pain.   ACTIVITY  LIMITATIONS carrying, lifting, bending, standing, squatting, sleeping, stairs, transfers, bathing, toileting, dressing, reach over head, hygiene/grooming, locomotion level, and caring for others  PARTICIPATION LIMITATIONS: meal prep, cleaning, laundry, shopping, and community activity  PERSONAL FACTORS Age are also affecting patient's functional outcome.   REHAB POTENTIAL: Good  CLINICAL DECISION MAKING: Evolving/moderate complexity  EVALUATION COMPLEXITY: Moderate  PLAN: PT FREQUENCY: 2x/week  PT DURATION: 12 weeks  PLANNED INTERVENTIONS: Therapeutic exercises, Therapeutic activity, Neuromuscular re-education, Balance training, Gait training, Patient/Family education, Self Care,  Joint mobilization, Stair training, Ionotophoresis 16m/ml Dexamethasone, and Manual therapy  PLAN FOR NEXT SESSION: Strengthening for RLE, stair training, balance with narrow BOS, speed of movement.    SEthel RanaDPT 06/12/22 12:30 PM

## 2022-06-14 ENCOUNTER — Ambulatory Visit: Payer: Medicare Other | Admitting: Physical Therapy

## 2022-06-14 ENCOUNTER — Encounter: Payer: Self-pay | Admitting: Physical Therapy

## 2022-06-14 DIAGNOSIS — M6281 Muscle weakness (generalized): Secondary | ICD-10-CM

## 2022-06-14 DIAGNOSIS — I69351 Hemiplegia and hemiparesis following cerebral infarction affecting right dominant side: Secondary | ICD-10-CM

## 2022-06-14 DIAGNOSIS — R262 Difficulty in walking, not elsewhere classified: Secondary | ICD-10-CM

## 2022-06-14 DIAGNOSIS — R278 Other lack of coordination: Secondary | ICD-10-CM

## 2022-06-14 DIAGNOSIS — R2681 Unsteadiness on feet: Secondary | ICD-10-CM

## 2022-06-14 NOTE — Therapy (Signed)
OUTPATIENT PHYSICAL THERAPY NEURO TREATMENT Progress Note Reporting Period 04/21/22 to 05/23/22  See note below for Objective Data and Assessment of Progress/Goals.      Patient Name: April Hunt MRN: [id number] DOB:1951/03/22, 71 y.o., female Today's Date: 06/14/2022   PCP: Ronnald Ramp, [name] REFERRING PROVIDER: Damita Lack, MD    PT End of Session - 06/14/22 1452     Visit Number 14    Date for PT Re-Evaluation 07/14/22    PT Start Time 1458    PT Stop Time 1540    PT Time Calculation (min) 42 min    Equipment Utilized During Treatment Gait belt    Activity Tolerance Patient tolerated treatment well    Behavior During Therapy WFL for tasks assessed/performed                         Past Medical History:  Diagnosis Date   Allergy    seasonal   Arthritis    Asthma 12/25/2011   Asthma 12/25/2011   Formatting of this note might be different from the original. Last Assessment & Plan:  Currently stable with PRN albuterol.   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   Pure hypercholesterolemia    Refusal of blood transfusions as patient is  [employer]    Sinusitis 03/04/2013   Tinea pedis 03/19/2012   Tinea versicolor 03/19/2012   Past Surgical History:  Procedure Laterality Date   ABDOMINAL HYSTERECTOMY  1998   Still has cervix and both ovaries   APPENDECTOMY     BACK SURGERY     JOINT REPLACEMENT     scope bil knees   KNEE ARTHROSCOPY  1997   right knee   Lymph node resection  1999   benign   MENISCUS REPAIR  1995   left knee   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   DR CAFFREY   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   Procedure: RIGHT TOTAL KNEE ARTHROPLASTY;  Surgeon: Yvette Rack., MD;  Location: [place];  Service: Orthopedics;  Laterality: Right;   Patient Active Problem List   Diagnosis Date Noted   Acute stroke due to ischemia ([place]) 04/03/2022   Lumbar degenerative disc disease 03/07/2022   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   Post-menopause atrophic vaginitis 02/08/2017   GERD without esophagitis 03/07/2016   Essential hypertension 11/16/2014   Osteoarthritis of right knee 12/26/2013   Mild intermittent asthma without complication 32/44/0102    ONSET DATE: 04/05/2022   REFERRING DIAG: Diagnosis R29.90 (ICD-10-CM) - Stroke-like symptoms  THERAPY DIAG:  Hemiplegia and hemiparesis following cerebral infarction affecting right dominant side (HCC)  Other lack of coordination  Muscle weakness (generalized)  Unsteadiness on feet  Difficulty in walking, not elsewhere classified  Rationale for Evaluation and Treatment Rehabilitation  SUBJECTIVE:                                                                                                                                                                                        SUBJECTIVE STATEMENT: Patient reports muscular soreness after last treatment.  PERTINENT HISTORY: 71 year old [place] comes to the hospital with complaints of GERD, HTN, HLD, asthma, history of lumbar radiculopathy status post laminectomy/fusion  came to [facility] with complains of right facial numbness and tingling along with right lower extremity weakness.  Neurology recommended admitting patient for stroke work-up.  CT of the head was negative.  She was started on aspirin and Plavix.  Patient was seen by neurology team.  A1c 5.4, UDS negative.  MRI brain was also negative, LDL was 155.  Right-sided facial symptoms resolved but right lower extremity weakness which persisted therefore lumbar spine was ordered.  MRI spine showed disc bulge with slight neurocompression.  Case was discussed with Dr. Christella Noa from neurosurgery who recommended outpatient follow-up in 7 days of Decadron otherwise no acute surgical needs at this time. Later in the day had extensive discussion with patient regarding her care and her sister was present at bedside.  I explained to patient that she will need to follow-up outpatient with pain management for her lower back and right lower extremity pain and weakness, take 7 days of oral Decadron and get outpatient neurosurgery referral by her PCP. She also tells me that she tells me that she is not allergic to sulfa tells me that she is not allergic to aspirin as long as enteric-coated therefore willing to take aspirin and Plavix for 3 weeks followed by aspirin.  PAIN:  Are you having pain? Yes: NPRS scale: 5/10 Pain location: whole R side Pain description: cramping Aggravating factors: end of day Relieving factors: rest  PRECAUTIONS: None  WEIGHT BEARING RESTRICTIONS No  FALLS: Has patient fallen in last 6 months? No  LIVING ENVIRONMENT: Lives with: lives with their family Lives in: House/apartment Stairs: Yes: Internal: 12 steps; on right going up Has following equipment at home: Single point cane and Walker - 4 wheeled  PLOF: Independent  PATIENT GOALS Walk on her own, return to her normal routine.  OBJECTIVE:   DIAGNOSTIC FINDINGS: -CT head, MRI brain is negative for acute pathology.  CTA head and neck is  also negative.  A1c 5.4, UDS negative, LDL 155.  Suspicion still remains for TIA.  A.  A.  Neurology recommending aspirin Plavix for 3 weeks followed by aspirin. -MRI lumbar spine showed bulging of the disc with nerve root compression.  Discussed with Dr Christella Noa who reviewed her MRI- no surgical indication, ok for week long decadron and outptn follow up.  She follows at [facility] pain management for monthly epidural bupivacaine/Kenalog injections. --Echocardiogram normal ef  COGNITION: Overall cognitive status: Within functional limits for tasks assessed   SENSATION: Patient reports numbness on R face, arm and leg  COORDINATION: Mildly slow fingers to thumb   MUSCLE TONE: RLE: Within functional limits   MUSCLE LENGTH: Hamstrings: Right 50 deg; Left 80 deg  POSTURE: No Significant postural limitations  LOWER EXTREMITY ROM:    PROM grossly WFL, R HS tightness noted.  LOWER EXTREMITY MMT:  LLE WNL  MMT Right Eval RLE 06/14/22  Hip flexion 2 3-  Hip extension 2 3  Hip abduction 2 3-  Hip adduction    Hip internal rotation    Hip external rotation    Knee flexion 2 3  Knee extension 2 3+  Ankle dorsiflexion 2+ 3+  Ankle plantarflexion    Ankle inversion    Ankle eversion    (Blank rows = not tested)  BED MOBILITY: I   TRANSFERS: Assistive device utilized: Environmental consultant - 4 wheeled  Sit to stand: Modified independence Stand to sit: Modified independence Chair to chair: Modified independence  STAIRS:  Level of Assistance: Modified independence  Stair Negotiation Technique: Step to Pattern with Bilateral Rails  Number of Stairs: 12   Height of Stairs: 6  Comments: Per patient report, to access her home.  GAIT: Gait pattern: step to pattern, decreased arm swing- Right, decreased arm swing- Left, decreased step length- Right, decreased stance time- Right, Right hip hike, trendelenburg, lateral hip instability, and trunk flexed Distance walked: 12' Assistive device utilized:  Environmental consultant - 4 wheeled Level of assistance: Modified independence Comments: Patient moves very slow, approximately 2 minutes to travel 47'  FUNCTIONAL TESTs:  5 times sit to stand: 41.08 Timed up and go (TUG): TBD Berg Balance Scale: TBD (31 in inpatient)  TODAY'S TREATMENT:  06/14/22 NuStep  L5 x 6 minutes MMT-RLE- see chart Supine <> L sidelie while holidng RLE in the air throughout x 4.Standing hip abd with BUE support on counter, 10 reps each, Standing march while holding 3# WATE bar, 10 reps each Mini tramp-jump switch in stride and jump Abd/add 10 each with BUE support  06/12/22 NuStep L5 x 6 minutes Standing R hip flexion, slide pillowcase forward, then lift foot into hip flexion, slide back. On treadmill, L foot on the side of the belt, BUE support. Push belt backwards and forwards with RLE, required mod A, 5 reps each way. Resisted step up with 4" step for RLE. 10 reps. Improved when she increased her speed of movement. Ambulated 1 x 100' without AD, no unsteadiness Stepping in box over X on the floor with Tband, clockwise, counterclockwise, changing directions upon therapist command, no unsteadiness noted. Ambulated outdoors x 500' without AD, Occasional CGA on unlevel surface, no LOB  06/01/22 NuStep L5 x 6 minutes Stair climbing, had difficulty with R hip flexion to step up with RLE Standing R hip flexion, slide pillowcase forward, then lift foot into hip flexion, slide back. Mini squats in Parallel bars, most weight on R, 10 reps Heel raises with most weight on R, 10 reps Walk with 3# WATE bar, x 150', improved step  length. Shuttle walk 10 x 15', moving cones from one surface to the other-much improved gait when she was distracted.  05/26/22 NuStep L5 x 6 minutes Resisted gait against 20#, 3 reps each direction. Heel raise/Toe raise 2 x 10 reps, No UE support, CGA Ambulated 1 x 80' while chest pressing 2# WATE bar Backwards amb x 20' while chest pressing 2# WATE  bar Walking on airex beams forward and back x 4 Side stepping on air ex beam R/L x 4 each Placed R foot on Red physioball in sit, rolling forward and back, side to side x 10 each. Difficulty lifting to place it on ball, and needed Min a to control add Ionto patch to R lateral knee   05/23/22 NuStep L5 x 6 minutes Functional re-assessment performed, see goals for results. Ambulated with 5# weight in BUE to facilitate trunk activation and stability.  05/17/22 NuStep L5 x 6 minutes. LLE step taps onto 4" step, BUE support on RW for balance, CGA, 10 nreps RLE step ups onto 4" step with BUE support, encouraged to minimize BUE support 10 reps. Seated hip flexion, lifting R foot up onto 4" step x 10, increased effort required. Step ups onto Airex pad and off while holding 2# WATE bar in BUE. Lateral steps ups onto and off Airex pad holding 2# WATE bar, occasional min A for balance. Heel raises while holding 2# WATE bar, 2 x 10 Ambulated while holding 2# WATE bar-forward, back and B side step, turns, CGA, total of approximately 120'. No unsteadiness noted. Ionto to R lateral knee  05/15/22 NuStep L4 with U and LE x 6 minutes Sit to stand x 5 from progressively lower surface, no UE support. Patient reported mild L medial knee pain, but did not fall into IR TKE in stand, 10 each, Green Tband Side to side step against green Tband at knees, 10 each way. Mini tramp- jump feet out/in and alternating stride, 2 x 5 reps each way. With rest breaks. Walking while holding 3# wate bar. Chest press while walking. 74' Iontophoresis to L medial knee.   05/12/22 Walking with 2# WATE bar, 2 x 25', CGA, stp through gait. Standing hold 2# WATE Bar- forward lunges x 8 with each leg, then lateral lunges x 8 with each leg. NuStep L3 x 6 minutes, U and LE Paloff press with R side closest to weight 5#, 10 reps. Mini tramp jumping then jump abd/add. Able speed up movements with repetition, 3 x 5 sets with Bue support  and CGA with belt.  05/10/22 Nustep L1-> progressing to L4 x5 minutes R LE only (unable to push with R LE when both legs were on the machine, but able to perform with R LE alone on Nustep)  -Step up onto 4 inch step with BUEs x5 R Mod verbal cues for lifting R LE high enough  -Toe taps on 8 inch step with L LE while maintaining stance R LE U UE support x10  -Standing hip ABD x10 BLEs  - LAQs 3# 1x10 L LE unable R LE even with 0# - seated mini-marches R LE 0#  1x10 - attempted tandem stance in bars- declined to perform without U UE support  - B heel and toe raises BUE support x10 each  - gait training in // bars with U UE progressing to one finger x2 full laps  - side steps in // bars 2 full laps two finger support   05/05/22 NuStep L3 x 6 minutes Supine-RLE  press into red physio ball while lifting LLE. Required mod facilitation initially, improved to light min Standing-Step back and out with LLE, turn back and reach with LUE to touch the target behind. 8 times to L and 5 times to R. Required increased A turning to R and mod facilitation to weight shift onto the leg once turned. Gait training with shopping cart, 1 x 200', patient initally with more normalized gait pattern, but as she progressed she started dragging R foot more. Standing at 4" step, place R foot on step. Step ups with BUE support. Improved in her R activation and use with repetition.  Placed L foot on step, R knee flex/ext.  05/03/22 Nustep L3 x17mns  STS w/red ball push out, CGA 2x5 Calf raises holding on to rollator 2x10  Step ups 4" up with L, difficulty picking up R   Walking with weighted shopping cart  [facility] steps [facility]  04/27/22 Nustep level 3 x 6 minutes Ball b/n knees squeeze LAQ on the left 2.5#, AAROM LAQ on the right Right ankle red tband PF, AAROM DF right ankle Marching on the left 2.5#, right AAROM Stairs 6" and 4" up and down with CGA, one at a time Supine feet on ball K2C, trunk rotation, small  bridges, isometric abs SAQ with left leg assist 3x5  04/25/22 NuStep at L2 x 5 minutes, required Vc and TC to release LLE when attempting to push with R TUG-23.34 BERG 37 Seated knee flexion and ext with R foot on towel to decrease friction-very slow and labored in both directions, x 3 Seated heel raise/toe raise Standing stepping forward and back. Sit to/from stand x 5  PATIENT EDUCATION: Education details: results of re-assessment Person educated: Patient Education method: Explanation and Demonstration Education comprehension: verbalized understanding and returned demonstration   HOME EXERCISE PROGRAM: Verbal-hip flexion, knee ext, ankle DF/PF-all in sitting. Consciously use RLE in all activities as much as possible.  [id number] GOALS: Goals reviewed with patient? Yes  SHORT TERM GOALS: Target date: 05/23/2022  I with basic HEP Baseline: Goal status: met  2.  5x STS in < 24 seconds Baseline: 41,  05/23/22-19.09 Goal status: met  LONG TERM GOALS: Target date: 07/14/2022  I with final HEP Baseline:  Goal status: ongoing  2.  5 x STS in < 15 seconds Baseline: 41, 05/23/22-19.09 Goal status:ongoing  3.  Increase RLE strength to at least 4-/5 throughout Baseline: 2/5, 3-/5-8/25, 05/23/22-3-/5 with MMT Goal status: ongoing  4.  Paitent will ambulate x at least 400' with LRAD, MI, minimized gait deviations, functional speed. Baseline: 05/23/22-300', Rollator, mildly slow, fatigued Goal status: ongoing  5.  Patient will score at least 40/56 on BERG balance assessment. Baseline: 31/56 in hospital, 8/8-37, 05/23/22-42/56 Goal status: met  6.  Paitent will complete TUG in < 15 sec with LRAD. Baseline: 23.34, 05/23/22-13.38 Goal status: met   ASSESSMENT:  CLINICAL IMPRESSION: Patient's strength re-assessed. RLE strength improved, still lagging in hip abd and flexion, knee flexion/ext and ankle DF. HEP updated to emphasize hip flexion, abd, coordinated muscle  control.  OBJECTIVE IMPAIRMENTS Abnormal gait, decreased activity tolerance, decreased balance, decreased coordination, decreased endurance, decreased mobility, difficulty walking, decreased strength, impaired UE functional use, improper body mechanics, postural dysfunction, and pain.   ACTIVITY LIMITATIONS carrying, lifting, bending, standing, squatting, sleeping, stairs, transfers, bathing, toileting, dressing, reach over head, hygiene/grooming, locomotion level, and caring for others  PARTICIPATION LIMITATIONS: meal prep, cleaning, laundry, shopping, and community activity  PERSONAL FACTORS  Age are also affecting patient's functional outcome.   REHAB POTENTIAL: Good  CLINICAL DECISION MAKING: Evolving/moderate complexity  EVALUATION COMPLEXITY: Moderate  PLAN: PT FREQUENCY: 2x/week  PT DURATION: 12 weeks  PLANNED INTERVENTIONS: Therapeutic exercises, Therapeutic activity, Neuromuscular re-education, Balance training, Gait training, Patient/Family education, Self Care, Joint mobilization, Stair training, Ionotophoresis 16m/ml Dexamethasone, and Manual therapy  PLAN FOR NEXT SESSION: Strengthening for RLE, stair training, balance with narrow BOS, speed of movement.    SEthel RanaDPT 06/14/22 4:00 PM

## 2022-06-26 ENCOUNTER — Ambulatory Visit: Payer: Medicare Other | Attending: Internal Medicine | Admitting: Physical Therapy

## 2022-06-26 DIAGNOSIS — I69351 Hemiplegia and hemiparesis following cerebral infarction affecting right dominant side: Secondary | ICD-10-CM | POA: Insufficient documentation

## 2022-06-26 DIAGNOSIS — R2681 Unsteadiness on feet: Secondary | ICD-10-CM | POA: Insufficient documentation

## 2022-06-26 DIAGNOSIS — R600 Localized edema: Secondary | ICD-10-CM | POA: Insufficient documentation

## 2022-06-26 DIAGNOSIS — R278 Other lack of coordination: Secondary | ICD-10-CM | POA: Insufficient documentation

## 2022-06-26 DIAGNOSIS — M6281 Muscle weakness (generalized): Secondary | ICD-10-CM | POA: Insufficient documentation

## 2022-06-26 DIAGNOSIS — R293 Abnormal posture: Secondary | ICD-10-CM | POA: Insufficient documentation

## 2022-06-26 DIAGNOSIS — R262 Difficulty in walking, not elsewhere classified: Secondary | ICD-10-CM | POA: Insufficient documentation

## 2022-06-28 ENCOUNTER — Encounter: Payer: Self-pay | Admitting: Physical Therapy

## 2022-06-28 ENCOUNTER — Ambulatory Visit: Payer: Medicare Other | Admitting: Physical Therapy

## 2022-06-28 DIAGNOSIS — R278 Other lack of coordination: Secondary | ICD-10-CM | POA: Diagnosis present

## 2022-06-28 DIAGNOSIS — R2681 Unsteadiness on feet: Secondary | ICD-10-CM

## 2022-06-28 DIAGNOSIS — I69351 Hemiplegia and hemiparesis following cerebral infarction affecting right dominant side: Secondary | ICD-10-CM | POA: Diagnosis present

## 2022-06-28 DIAGNOSIS — R262 Difficulty in walking, not elsewhere classified: Secondary | ICD-10-CM | POA: Diagnosis present

## 2022-06-28 DIAGNOSIS — R600 Localized edema: Secondary | ICD-10-CM | POA: Diagnosis present

## 2022-06-28 DIAGNOSIS — R293 Abnormal posture: Secondary | ICD-10-CM | POA: Diagnosis present

## 2022-06-28 DIAGNOSIS — M6281 Muscle weakness (generalized): Secondary | ICD-10-CM | POA: Diagnosis present

## 2022-06-28 NOTE — Therapy (Signed)
OUTPATIENT PHYSICAL THERAPY NEURO TREATMENT Progress Note Reporting Period 04/21/22 to 05/23/22  See note below for Objective Data and Assessment of Progress/Goals.      Patient Name: April Hunt MRN: [id number] DOB:1951-05-12, 71 y.o., female Today's Date: 06/28/2022   PCP: Ronnald Ramp, [name] REFERRING PROVIDER: Damita Lack, MD    PT End of Session - 06/28/22 1500     Visit Number 15    Date for PT Re-Evaluation 07/14/22    PT Start Time 1458    PT Stop Time 1540    PT Time Calculation (min) 42 min    Activity Tolerance Patient tolerated treatment well    Behavior During Therapy [facility] for tasks assessed/performed            Past Medical History:  Diagnosis Date   Allergy    seasonal   Arthritis    Asthma 12/25/2011   Asthma 12/25/2011   Formatting of this note might be different from the original. Last Assessment & Plan:  Currently stable with PRN albuterol.   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   Pure hypercholesterolemia    Refusal of blood transfusions as patient is Jehovah's Witness    Sinusitis 03/04/2013   Tinea pedis 03/19/2012   Tinea  versicolor 03/19/2012   Past Surgical History:  Procedure Laterality Date   ABDOMINAL HYSTERECTOMY  1998   Still has cervix and both ovaries   APPENDECTOMY     BACK SURGERY     JOINT REPLACEMENT     scope bil knees   KNEE ARTHROSCOPY  1997   right knee   Lymph node resection  1999   benign   MENISCUS REPAIR  1995   left knee   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   DR CAFFREY   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   Procedure: RIGHT TOTAL KNEE ARTHROPLASTY;  Surgeon: Yvette Rack., MD;  Location: [facility];  Service: Orthopedics;  Laterality: Right;   Patient Active Problem List   Diagnosis Date Noted   Acute stroke due to ischemia ([place]) 04/03/2022   Lumbar degenerative disc disease 03/07/2022   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   Post-menopause atrophic vaginitis 02/08/2017   GERD without esophagitis 03/07/2016   Essential hypertension 11/16/2014   Osteoarthritis of right knee 12/26/2013   Mild intermittent asthma without complication 41/93/7902    ONSET DATE: 04/05/2022   REFERRING DIAG: Diagnosis R29.90 (ICD-10-CM) - Stroke-like symptoms   THERAPY DIAG:  Hemiplegia and hemiparesis following cerebral infarction affecting right dominant side (HCC)  Other lack of coordination  Muscle weakness (generalized)  Unsteadiness on feet  Difficulty in walking, not elsewhere classified  Rationale for Evaluation and Treatment Rehabilitation  SUBJECTIVE:                                                                                                                                                                                        SUBJECTIVE STATEMENT: Patient reports no new issues. She feel she is slowly progressing.  PERTINENT HISTORY: 71 year old [place] comes to the hospital with complaints of GERD, HTN, HLD, asthma, history of lumbar radiculopathy status post laminectomy/fusion came to [facility] with complains of right facial numbness and tingling  along with right lower extremity weakness.  Neurology recommended admitting patient for stroke work-up.  CT of the head was negative.  She was started on aspirin and Plavix.  Patient was seen by neurology team.  A1c 5.4, UDS negative.  MRI brain was also negative, LDL was 155.  Right-sided facial symptoms resolved but right lower extremity weakness which persisted therefore lumbar spine was ordered.  MRI spine showed disc bulge with slight neurocompression.  Case was discussed with Dr. Christella Noa from neurosurgery who recommended outpatient follow-up in 7 days of Decadron otherwise no acute surgical needs at this time. Later in the day had extensive discussion with patient regarding her care and her sister was present at bedside.  I explained to patient that she will need to follow-up outpatient with pain management for her lower back and right lower extremity pain and weakness, take 7 days of oral Decadron and get outpatient neurosurgery referral by her PCP. She also tells me that she tells me that she is not allergic to sulfa tells me that she is not allergic to aspirin as long as enteric-coated therefore willing to take aspirin and Plavix for 3 weeks followed by aspirin.  PAIN:  Are you having pain? Yes: NPRS scale: 5/10 Pain location: whole R side Pain description: cramping Aggravating factors: end of day Relieving factors: rest  PRECAUTIONS: None  WEIGHT BEARING RESTRICTIONS No  FALLS: Has patient fallen in last 6 months? No  LIVING ENVIRONMENT: Lives with: lives with their family Lives in: House/apartment Stairs: Yes: Internal: 12 steps; on right going up Has following equipment at home: Single point cane and Walker - 4 wheeled  PLOF: Independent  PATIENT GOALS Walk on her own, return to her normal routine.  OBJECTIVE:   DIAGNOSTIC FINDINGS: -CT head, MRI brain is negative for acute pathology.  CTA head and neck is also negative.  A1c 5.4, UDS negative, LDL 155.  Suspicion still  remains for TIA.  A.  A.  Neurology recommending aspirin Plavix for 3  weeks followed by aspirin. -MRI lumbar spine showed bulging of the disc with nerve root compression.  Discussed with Dr Christella Noa who reviewed her MRI- no surgical indication, ok for week long decadron and outptn follow up.  She follows at [facility] pain management for monthly epidural bupivacaine/Kenalog injections. --Echocardiogram normal ef  COGNITION: Overall cognitive status: Within functional limits for tasks assessed   SENSATION: Patient reports numbness on R face, arm and leg  COORDINATION: Mildly slow fingers to thumb   MUSCLE TONE: RLE: Within functional limits   MUSCLE LENGTH: Hamstrings: Right 50 deg; Left 80 deg  POSTURE: No Significant postural limitations  LOWER EXTREMITY ROM:    PROM grossly WFL, R HS tightness noted.  LOWER EXTREMITY MMT:  LLE WNL  MMT Right Eval RLE 06/14/22  Hip flexion 2 3-  Hip extension 2 3  Hip abduction 2 3-  Hip adduction    Hip internal rotation    Hip external rotation    Knee flexion 2 3  Knee extension 2 3+  Ankle dorsiflexion 2+ 3+  Ankle plantarflexion    Ankle inversion    Ankle eversion    (Blank rows = not tested)  BED MOBILITY: I   TRANSFERS: Assistive device utilized: Environmental consultant - 4 wheeled  Sit to stand: Modified independence Stand to sit: Modified independence Chair to chair: Modified independence  STAIRS:  Level of Assistance: Modified independence  Stair Negotiation Technique: Step to Pattern with Bilateral Rails  Number of Stairs: 12   Height of Stairs: 6  Comments: Per patient report, to access her home.  GAIT: Gait pattern: step to pattern, decreased arm swing- Right, decreased arm swing- Left, decreased step length- Right, decreased stance time- Right, Right hip hike, trendelenburg, lateral hip instability, and trunk flexed Distance walked: 28' Assistive device utilized: Environmental consultant - 4 wheeled Level of assistance: Modified  independence Comments: Patient moves very slow, approximately 2 minutes to travel 46'  FUNCTIONAL TESTs:  5 times sit to stand: 41.08 Timed up and go (TUG): TBD Berg Balance Scale: TBD (31 in inpatient)  TODAY'S TREATMENT:  06/28/22 NuStep L5 x 6 minutes Sit to stand from elevated mat with LLE on 6" step x 10 Standing rotation to R and back with LLE on 6" step x 10 Heel raises with LLE on 6" step, 10 reps Standing side steps on floor mat, 4 x each direction Alternately tapping organe cone with each leg. Great difficulty lifitng RLE high enough Pillow case slide into hip flexion on mat x 10 Able to lift Rle onto cone, still difficult\ Bicycle with RLE only, mod A for the lifting part of circle.  06/14/22 NuStep  L5 x 6 minutes MMT-RLE- see chart Supine <> L sidelie while holidng RLE in the air throughout x 4.Standing hip abd with BUE support on counter, 10 reps each, Standing march while holding 3# WATE bar, 10 reps each Mini tramp-jump switch in stride and jump Abd/add 10 each with BUE support  06/12/22 NuStep L5 x 6 minutes Standing R hip flexion, slide pillowcase forward, then lift foot into hip flexion, slide back. On treadmill, L foot on the side of the belt, BUE support. Push belt backwards and forwards with RLE, required mod A, 5 reps each way. Resisted step up with 4" step for RLE. 10 reps. Improved when she increased her speed of movement. Ambulated 1 x 100' without AD, no unsteadiness Stepping in box over X on the floor with Tband, clockwise, counterclockwise, changing directions upon therapist command, no  unsteadiness noted. Ambulated outdoors x 500' without AD, Occasional CGA on unlevel surface, no LOB  06/01/22 NuStep L5 x 6 minutes Stair climbing, had difficulty with R hip flexion to step up with RLE Standing R hip flexion, slide pillowcase forward, then lift foot into hip flexion, slide back. Mini squats in Parallel bars, most weight on R, 10 reps Heel raises with  most weight on R, 10 reps Walk with 3# WATE bar, x 150', improved step length. Shuttle walk 10 x 15', moving cones from one surface to the other-much improved gait when she was distracted.  05/26/22 NuStep L5 x 6 minutes Resisted gait against 20#, 3 reps each direction. Heel raise/Toe raise 2 x 10 reps, No UE support, CGA Ambulated 1 x 80' while chest pressing 2# WATE bar Backwards amb x 20' while chest pressing 2# WATE bar Walking on airex beams forward and back x 4 Side stepping on air ex beam R/L x 4 each Placed R foot on Red physioball in sit, rolling forward and back, side to side x 10 each. Difficulty lifting to place it on ball, and needed Min a to control add Ionto patch to R lateral knee   05/23/22 NuStep L5 x 6 minutes Functional re-assessment performed, see goals for results. Ambulated with 5# weight in BUE to facilitate trunk activation and stability.  05/17/22 NuStep L5 x 6 minutes. LLE step taps onto 4" step, BUE support on RW for balance, CGA, 10 nreps RLE step ups onto 4" step with BUE support, encouraged to minimize BUE support 10 reps. Seated hip flexion, lifting R foot up onto 4" step x 10, increased effort required. Step ups onto Airex pad and off while holding 2# WATE bar in BUE. Lateral steps ups onto and off Airex pad holding 2# WATE bar, occasional min A for balance. Heel raises while holding 2# WATE bar, 2 x 10 Ambulated while holding 2# WATE bar-forward, back and B side step, turns, CGA, total of approximately 120'. No unsteadiness noted. Ionto to R lateral knee  05/15/22 NuStep L4 with U and LE x 6 minutes Sit to stand x 5 from progressively lower surface, no UE support. Patient reported mild L medial knee pain, but did not fall into IR TKE in stand, 10 each, Green Tband Side to side step against green Tband at knees, 10 each way. Mini tramp- jump feet out/in and alternating stride, 2 x 5 reps each way. With rest breaks. Walking while holding 3# wate bar.  Chest press while walking. 39' Iontophoresis to L medial knee.   05/12/22 Walking with 2# WATE bar, 2 x 25', CGA, stp through gait. Standing hold 2# WATE Bar- forward lunges x 8 with each leg, then lateral lunges x 8 with each leg. NuStep L3 x 6 minutes, U and LE Paloff press with R side closest to weight 5#, 10 reps. Mini tramp jumping then jump abd/add. Able speed up movements with repetition, 3 x 5 sets with Bue support and CGA with belt.  05/10/22 Nustep L1-> progressing to L4 x5 minutes R LE only (unable to push with R LE when both legs were on the machine, but able to perform with R LE alone on Nustep)  -Step up onto 4 inch step with BUEs x5 R Mod verbal cues for lifting R LE high enough  -Toe taps on 8 inch step with L LE while maintaining stance R LE U UE support x10  -Standing hip ABD x10 BLEs  - LAQs 3# 1x10  L LE unable R LE even with 0# - seated mini-marches R LE 0#  1x10 - attempted tandem stance in bars- declined to perform without U UE support  - B heel and toe raises BUE support x10 each  - gait training in // bars with U UE progressing to one finger x2 full laps  - side steps in // bars 2 full laps two finger support   05/05/22 NuStep L3 x 6 minutes Supine-RLE press into red physio ball while lifting LLE. Required mod facilitation initially, improved to light min Standing-Step back and out with LLE, turn back and reach with LUE to touch the target behind. 8 times to L and 5 times to R. Required increased A turning to R and mod facilitation to weight shift onto the leg once turned. Gait training with shopping cart, 1 x 200', patient initally with more normalized gait pattern, but as she progressed she started dragging R foot more. Standing at 4" step, place R foot on step. Step ups with BUE support. Improved in her R activation and use with repetition.  Placed L foot on step, R knee flex/ext.  05/03/22 Nustep L3 x59mns  STS w/red ball push out, CGA 2x5 Calf raises holding  on to rollator 2x10  Step ups 4" up with L, difficulty picking up R   Walking with weighted shopping cart  Side steps with [facility]  04/27/22 Nustep level 3 x 6 minutes Ball b/n knees squeeze LAQ on the left 2.5#, AAROM LAQ on the right Right ankle red tband PF, AAROM DF right ankle Marching on the left 2.5#, right AAROM Stairs 6" and 4" up and down with CGA, one at a time Supine feet on ball K2C, trunk rotation, small bridges, isometric abs SAQ with left leg assist 3x5  04/25/22 NuStep at L2 x 5 minutes, required Vc and TC to release LLE when attempting to push with R TUG-23.34 BERG 37 Seated knee flexion and ext with R foot on towel to decrease friction-very slow and labored in both directions, x 3 Seated heel raise/toe raise Standing stepping forward and back. Sit to/from stand x 5  PATIENT EDUCATION: Education details: results of re-assessment Person educated: Patient Education method: Explanation and Demonstration Education comprehension: verbalized understanding and returned demonstration   HOME EXERCISE PROGRAM: Verbal-hip flexion, knee ext, ankle DF/PF-all in sitting. Consciously use RLE in all activities as much as possible.  [id number] GOALS: Goals reviewed with patient? Yes  SHORT TERM GOALS: Target date: 05/23/2022  I with basic HEP Baseline: Goal status: met  2.  5x STS in < 24 seconds Baseline: 41,  05/23/22-19.09 Goal status: met  LONG TERM GOALS: Target date: 07/14/2022  I with final HEP Baseline:  Goal status: ongoing  2.  5 x STS in < 15 seconds Baseline: 41, 05/23/22-19.09 Goal status:ongoing  3.  Increase RLE strength to at least 4-/5 throughout Baseline: 2/5, 3-/5-8/25, 05/23/22-3-/5 with MMT Goal status: ongoing  4.  Paitent will ambulate x at least 400' with LRAD, MI, minimized gait deviations, functional speed. Baseline: 05/23/22-300', Rollator, mildly slow, fatigued Goal status: ongoing  5.  Patient will score at least 40/56 on BERG balance  assessment. Baseline: 31/56 in hospital, 8/8-37, 05/23/22-42/56 Goal status: met  6.  Paitent will complete TUG in < 15 sec with LRAD. Baseline: 23.34, 05/23/22-13.38 Goal status: met   ASSESSMENT:  CLINICAL IMPRESSION: Patient imporving, but still having difficulty with R hip flexion strength. Continued to focus on building strength and speed  of movement in RLE.  OBJECTIVE IMPAIRMENTS Abnormal gait, decreased activity tolerance, decreased balance, decreased coordination, decreased endurance, decreased mobility, difficulty walking, decreased strength, impaired UE functional use, improper body mechanics, postural dysfunction, and pain.   ACTIVITY LIMITATIONS carrying, lifting, bending, standing, squatting, sleeping, stairs, transfers, bathing, toileting, dressing, reach over head, hygiene/grooming, locomotion level, and caring for others  PARTICIPATION LIMITATIONS: meal prep, cleaning, laundry, shopping, and community activity  PERSONAL FACTORS Age are also affecting patient's functional outcome.   REHAB POTENTIAL: Good  CLINICAL DECISION MAKING: Evolving/moderate complexity  EVALUATION COMPLEXITY: Moderate  PLAN: PT FREQUENCY: 2x/week  PT DURATION: 12 weeks  PLANNED INTERVENTIONS: Therapeutic exercises, Therapeutic activity, Neuromuscular re-education, Balance training, Gait training, Patient/Family education, Self Care, Joint mobilization, Stair training, Ionotophoresis 9m/ml Dexamethasone, and Manual therapy  PLAN FOR NEXT SESSION: Strengthening for RLE, stair training, balance with narrow BOS, speed of movement.    SEthel RanaDPT 06/28/22 3:45 PM

## 2022-07-03 ENCOUNTER — Ambulatory Visit: Payer: Medicare Other | Admitting: Physical Therapy

## 2022-07-03 ENCOUNTER — Encounter: Payer: Self-pay | Admitting: Physical Therapy

## 2022-07-03 DIAGNOSIS — R293 Abnormal posture: Secondary | ICD-10-CM | POA: Diagnosis not present

## 2022-07-03 DIAGNOSIS — R278 Other lack of coordination: Secondary | ICD-10-CM

## 2022-07-03 DIAGNOSIS — R262 Difficulty in walking, not elsewhere classified: Secondary | ICD-10-CM

## 2022-07-03 DIAGNOSIS — R2681 Unsteadiness on feet: Secondary | ICD-10-CM

## 2022-07-03 DIAGNOSIS — I69351 Hemiplegia and hemiparesis following cerebral infarction affecting right dominant side: Secondary | ICD-10-CM

## 2022-07-03 DIAGNOSIS — M6281 Muscle weakness (generalized): Secondary | ICD-10-CM

## 2022-07-03 NOTE — Therapy (Signed)
OUTPATIENT PHYSICAL THERAPY NEURO TREATMENT Progress Note Reporting Period 04/21/22 to 05/23/22  See note below for Objective Data and Assessment of Progress/Goals.      Patient Name: April Hunt MRN: [id number] DOB:Jan 29, 1951, 71 y.o., female Today's Date: 07/03/2022   PCP: April Hunt, April Hunt REFERRING PROVIDER: Damita Lack, MD    PT End of Session - 07/03/22 1235     Visit Number 16    Date for PT Re-Evaluation 07/14/22    PT Start Time 1660    PT Stop Time 1312    PT Time Calculation (min) 42 min    Activity Tolerance Patient tolerated treatment well    Behavior During Therapy [facility] for tasks assessed/performed             Past Medical History:  Diagnosis Date   Allergy    seasonal   Arthritis    Asthma 12/25/2011   Asthma 12/25/2011   Formatting of this note might be different from the original. Last Assessment & Plan:  Currently stable with PRN albuterol.   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   Pure hypercholesterolemia    Refusal of blood transfusions as patient is Jehovah's Witness    Sinusitis 03/04/2013   Tinea pedis 03/19/2012   Tinea  versicolor 03/19/2012   Past Surgical History:  Procedure Laterality Date   ABDOMINAL HYSTERECTOMY  1998   Still has cervix and both ovaries   APPENDECTOMY     BACK SURGERY     JOINT REPLACEMENT     scope bil knees   KNEE ARTHROSCOPY  1997   right knee   Lymph node resection  1999   benign   MENISCUS REPAIR  1995   left knee   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   DR CAFFREY   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   Procedure: RIGHT TOTAL KNEE ARTHROPLASTY;  Surgeon: Yvette Rack., MD;  Location: [place];  Service: Orthopedics;  Laterality: Right;   Patient Active Problem List   Diagnosis Date Noted   Acute stroke due to ischemia ([place]) 04/03/2022   Lumbar degenerative disc disease 03/07/2022   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   Post-menopause atrophic vaginitis 02/08/2017   GERD without esophagitis 03/07/2016   Essential hypertension 11/16/2014   Osteoarthritis of right knee 12/26/2013   Mild intermittent asthma without complication 09/32/3557    ONSET DATE: 04/05/2022   REFERRING DIAG: Diagnosis R29.90 (ICD-10-CM) - Stroke-like symptoms   THERAPY DIAG:  Hemiplegia and hemiparesis following cerebral infarction affecting right dominant side (HCC)  Other Hunt of coordination  Muscle weakness (generalized)  Unsteadiness on feet  Difficulty in walking, not elsewhere classified  Rationale for Evaluation and Treatment Rehabilitation  SUBJECTIVE:                                                                                                                                                                                        SUBJECTIVE STATEMENT: Patient reports no new issues. She feel she is slowly progressing.  PERTINENT HISTORY: 71 year old April Hunt comes to the hospital with complaints of GERD, HTN, HLD, asthma, history of lumbar radiculopathy status post laminectomy/fusion came to [facility] with complains of right facial numbness and tingling  along with right lower extremity weakness.  Neurology recommended admitting patient for stroke work-up.  CT of the head was negative.  She was started on aspirin and Plavix.  Patient was seen by neurology team.  A1c 5.4, UDS negative.  MRI brain was also negative, LDL was 155.  Right-sided facial symptoms resolved but right lower extremity weakness which persisted therefore lumbar spine was ordered.  MRI spine showed disc bulge with slight neurocompression.  Case was discussed with Dr. Christella Noa from neurosurgery who recommended outpatient follow-up in 7 days of Decadron otherwise no acute surgical needs at this time. Later in the day had extensive discussion with patient regarding her care and her sister was present at bedside.  I explained to patient that she will need to follow-up outpatient with pain management for her lower back and right lower extremity pain and weakness, take 7 days of oral Decadron and get outpatient neurosurgery referral by her PCP. She also tells me that she tells me that she is not allergic to sulfa tells me that she is not allergic to aspirin as long as enteric-coated therefore willing to take aspirin and Plavix for 3 weeks followed by aspirin.  PAIN:  Are you having pain? Yes: NPRS scale: 5/10 Pain location: whole R side Pain description: cramping Aggravating factors: end of day Relieving factors: rest  PRECAUTIONS: None  WEIGHT BEARING RESTRICTIONS No  FALLS: Has patient fallen in last 6 months? No  LIVING ENVIRONMENT: Lives with: lives with their family Lives in: House/apartment Stairs: Yes: Internal: 12 steps; on right going up Has following equipment at home: Single point cane and Walker - 4 wheeled  PLOF: Independent  PATIENT GOALS Walk on her own, return to her normal routine.  OBJECTIVE:   DIAGNOSTIC FINDINGS: -CT head, MRI brain is negative for acute pathology.  CTA head and neck is also negative.  A1c 5.4, UDS negative, LDL 155.  Suspicion still  remains for TIA.  A.  A.  Neurology recommending aspirin Plavix for 3  weeks followed by aspirin. -MRI lumbar spine showed bulging of the disc with nerve root compression.  Discussed with Dr Christella Noa who reviewed her MRI- no surgical indication, ok for week long decadron and outptn follow up.  She follows at [facility] pain management for monthly epidural bupivacaine/Kenalog injections. --Echocardiogram normal ef  COGNITION: Overall cognitive status: Within functional limits for tasks assessed   SENSATION: Patient reports numbness on R face, arm and leg  COORDINATION: Mildly slow fingers to thumb   MUSCLE TONE: RLE: Within functional limits   MUSCLE LENGTH: Hamstrings: Right 50 deg; Left 80 deg  POSTURE: No Significant postural limitations  LOWER EXTREMITY ROM:    PROM grossly WFL, R HS tightness noted.  LOWER EXTREMITY MMT:  LLE WNL  MMT Right Eval RLE 06/14/22  Hip flexion 2 3-  Hip extension 2 3  Hip abduction 2 3-  Hip adduction    Hip internal rotation    Hip external rotation    Knee flexion 2 3  Knee extension 2 3+  Ankle dorsiflexion 2+ 3+  Ankle plantarflexion    Ankle inversion    Ankle eversion    (Blank rows = not tested)  BED MOBILITY: I   TRANSFERS: Assistive device utilized: Environmental consultant - 4 wheeled  Sit to stand: Modified independence Stand to sit: Modified independence Chair to chair: Modified independence  STAIRS:  Level of Assistance: Modified independence  Stair Negotiation Technique: Step to Pattern with Bilateral Rails  Number of Stairs: 12   Height of Stairs: 6  Comments: Per patient report, to access her home.  GAIT: Gait pattern: step to pattern, decreased arm swing- Right, decreased arm swing- Left, decreased step length- Right, decreased stance time- Right, Right hip hike, trendelenburg, lateral hip instability, and trunk flexed Distance walked: 94' Assistive device utilized: Environmental consultant - 4 wheeled Level of assistance: Modified  independence Comments: Patient moves very slow, approximately 2 minutes to travel 3'  FUNCTIONAL TESTs:  5 times sit to stand: 41.08 Timed up and go (TUG): TBD Berg Balance Scale: TBD (31 in inpatient)  TODAY'S TREATMENT:  07/03/22 NuStep L5 x 6 minutes Alternating taps on 4" step 10 reps each holding 3# WATE bar in BUE. Step ups onto 4" step, alternating feet, x 10 reps Quick steps forward and back leading with RLE x 30 reps, holding 3# WATE bar in BUE Walk with SPC, emphasizing high knee lift and knee ext on R. X 80' Ionto patch to Hunt distal, med knee  06/28/22 NuStep L5 x 6 minutes Sit to stand from elevated mat with LLE on 6" step x 10 Standing rotation to R and back with LLE on 6" step x 10 Heel raises with LLE on 6" step, 10 reps Standing side steps on floor mat, 4 x each direction Alternately tapping organe cone with each leg. Great difficulty lifitng RLE high enough Pillow case slide into hip flexion on mat x 10 Able to lift Rle onto cone, still difficult_0 Bicycle with RLE only, mod A for the lifting part of circle.  06/14/22 NuStep  L5 x 6 minutes MMT-RLE- see chart Supine <> Hunt sidelie while holidng RLE in the air throughout x 4.Standing hip abd with BUE support on counter, 10 reps each, Standing march while holding 3# WATE bar, 10 reps each Mini tramp-jump switch in stride and jump Abd/add 10 each with BUE support  06/12/22 NuStep L5 x 6 minutes Standing R hip flexion, slide pillowcase forward, then lift foot into hip flexion, slide back. On  treadmill, Hunt foot on the side of the belt, BUE support. Push belt backwards and forwards with RLE, required mod A, 5 reps each way. Resisted step up with 4" step for RLE. 10 reps. Improved when she increased her speed of movement. Ambulated 1 x 100' without AD, no unsteadiness Stepping in box over X on the floor with Tband, clockwise, counterclockwise, changing directions upon therapist command, no unsteadiness noted. Ambulated  outdoors x 500' without AD, Occasional CGA on unlevel surface, no LOB  06/01/22 NuStep L5 x 6 minutes Stair climbing, had difficulty with R hip flexion to step up with RLE Standing R hip flexion, slide pillowcase forward, then lift foot into hip flexion, slide back. Mini squats in Parallel bars, most weight on R, 10 reps Heel raises with most weight on R, 10 reps Walk with 3# WATE bar, x 150', improved step length. Shuttle walk 10 x 15', moving cones from one surface to the other-much improved gait when she was distracted.  05/26/22 NuStep L5 x 6 minutes Resisted gait against 20#, 3 reps each direction. Heel raise/Toe raise 2 x 10 reps, No UE support, CGA Ambulated 1 x 80' while chest pressing 2# WATE bar Backwards amb x 20' while chest pressing 2# WATE bar Walking on airex beams forward and back x 4 Side stepping on air ex beam R/Hunt x 4 each Placed R foot on Red physioball in sit, rolling forward and back, side to side x 10 each. Difficulty lifting to place it on ball, and needed Min a to control add Ionto patch to R lateral knee   05/23/22 NuStep L5 x 6 minutes Functional re-assessment performed, see goals for results. Ambulated with 5# weight in BUE to facilitate trunk activation and stability.  05/17/22 NuStep L5 x 6 minutes. LLE step taps onto 4" step, BUE support on RW for balance, CGA, 10 nreps RLE step ups onto 4" step with BUE support, encouraged to minimize BUE support 10 reps. Seated hip flexion, lifting R foot up onto 4" step x 10, increased effort required. Step ups onto Airex pad and off while holding 2# WATE bar in BUE. Lateral steps ups onto and off Airex pad holding 2# WATE bar, occasional min A for balance. Heel raises while holding 2# WATE bar, 2 x 10 Ambulated while holding 2# WATE bar-forward, back and B side step, turns, CGA, total of approximately 120'. No unsteadiness noted. Ionto to R lateral knee  05/15/22 NuStep L4 with U and LE x 6 minutes Sit to stand x 5  from progressively lower surface, no UE support. Patient reported mild Hunt medial knee pain, but did not fall into IR TKE in stand, 10 each, Green Tband Side to side step against green Tband at knees, 10 each way. Mini tramp- jump feet out/in and alternating stride, 2 x 5 reps each way. With rest breaks. Walking while holding 3# wate bar. Chest press while walking. 75' Iontophoresis to Hunt medial knee.   05/12/22 Walking with 2# WATE bar, 2 x 25', CGA, stp through gait. Standing hold 2# WATE Bar- forward lunges x 8 with each leg, then lateral lunges x 8 with each leg. NuStep L3 x 6 minutes, U and LE Paloff press with R side closest to weight 5#, 10 reps. Mini tramp jumping then jump abd/add. Able speed up movements with repetition, 3 x 5 sets with Bue support and CGA with belt.  05/10/22 Nustep L1-> progressing to L4 x5 minutes R LE only (unable to push with  R LE when both legs were on the machine, but able to perform with R LE alone on Nustep)  -Step up onto 4 inch step with BUEs x5 R Mod verbal cues for lifting R LE high enough  -Toe taps on 8 inch step with Hunt LE while maintaining stance R LE U UE support x10  -Standing hip ABD x10 BLEs  - LAQs 3# 1x10 Hunt LE unable R LE even with 0# - seated mini-marches R LE 0#  1x10 - attempted tandem stance in bars- declined to perform without U UE support  - B heel and toe raises BUE support x10 each  - gait training in // bars with U UE progressing to one finger x2 full laps  - side steps in // bars 2 full laps two finger support   05/05/22 NuStep L3 x 6 minutes Supine-RLE press into red physio ball while lifting LLE. Required mod facilitation initially, improved to light min Standing-Step back and out with LLE, turn back and reach with LUE to touch the target behind. 8 times to Hunt and 5 times to R. Required increased A turning to R and mod facilitation to weight shift onto the leg once turned. Gait training with shopping cart, 1 x 200', patient initally  with more normalized gait pattern, but as she progressed she started dragging R foot more. Standing at 4" step, place R foot on step. Step ups with BUE support. Improved in her R activation and use with repetition.  Placed Hunt foot on step, R knee flex/ext.  05/03/22 Nustep L3 x38mns  STS w/red ball push out, CGA 2x5 Calf raises holding on to rollator 2x10  Step ups 4" up with Hunt, difficulty picking up R   Walking with weighted shopping cart  Side steps with [facility]  04/27/22 Nustep level 3 x 6 minutes Ball b/n knees squeeze LAQ on the left 2.5#, AAROM LAQ on the right Right ankle red tband PF, AAROM DF right ankle Marching on the left 2.5#, right AAROM Stairs 6" and 4" up and down with CGA, one at a time Supine feet on ball K2C, trunk rotation, small bridges, isometric abs SAQ with left leg assist 3x5  04/25/22 NuStep at L2 x 5 minutes, required Vc and TC to release LLE when attempting to push with R TUG-23.34 BERG 37 Seated knee flexion and ext with R foot on towel to decrease friction-very slow and labored in both directions, x 3 Seated heel raise/toe raise Standing stepping forward and back. Sit to/from stand x 5  PATIENT EDUCATION: Education details: results of re-assessment Person educated: Patient Education method: Explanation and Demonstration Education comprehension: verbalized understanding and returned demonstration   HOME EXERCISE PROGRAM: Verbal-hip flexion, knee ext, ankle DF/PF-all in sitting. Consciously use RLE in all activities as much as possible.  [id number] GOALS: Goals reviewed with patient? Yes  SHORT TERM GOALS: Target date: 05/23/2022  I with basic HEP Baseline: Goal status: met  2.  5x STS in < 24 seconds Baseline: 41,  05/23/22-19.09 Goal status: met  LONG TERM GOALS: Target date: 07/14/2022  I with final HEP Baseline:  Goal status: ongoing  2.  5 x STS in < 15 seconds Baseline: 41, 05/23/22-19.09 Goal status:ongoing  3.  Increase RLE  strength to at least 4-/5 throughout Baseline: 2/5, 3-/5-8/25, 05/23/22-3-/5 with MMT Goal status: ongoing  4.  Paitent will ambulate x at least 400' with LRAD, MI, minimized gait deviations, functional speed. Baseline: 05/23/22-300', Rollator, mildly slow, fatigued  Goal status: ongoing  5.  Patient will score at least 40/56 on BERG balance assessment. Baseline: 31/56 in hospital, 8/8-37, 05/23/22-42/56 Goal status: met  6.  Paitent will complete TUG in < 15 sec with LRAD. Baseline: 23.34, 05/23/22-13.38 Goal status: met   ASSESSMENT:  CLINICAL IMPRESSION: Patient imporving, but still having difficulty with R hip flexion strength. Continued to focus on building strength and speed of movement in RLE.  OBJECTIVE IMPAIRMENTS Abnormal gait, decreased activity tolerance, decreased balance, decreased coordination, decreased endurance, decreased mobility, difficulty walking, decreased strength, impaired UE functional use, improper body mechanics, postural dysfunction, and pain.   ACTIVITY LIMITATIONS carrying, lifting, bending, standing, squatting, sleeping, stairs, transfers, bathing, toileting, dressing, reach over head, hygiene/grooming, locomotion level, and caring for others  PARTICIPATION LIMITATIONS: meal prep, cleaning, laundry, shopping, and community activity  PERSONAL FACTORS Age are also affecting patient's functional outcome.   REHAB POTENTIAL: Good  CLINICAL DECISION MAKING: Evolving/moderate complexity  EVALUATION COMPLEXITY: Moderate  PLAN: PT FREQUENCY: 2x/week  PT DURATION: 12 weeks  PLANNED INTERVENTIONS: Therapeutic exercises, Therapeutic activity, Neuromuscular re-education, Balance training, Gait training, Patient/Family education, Self Care, Joint mobilization, Stair training, Ionotophoresis 106m/ml Dexamethasone, and Manual therapy  PLAN FOR NEXT SESSION: Strengthening for RLE, stair training, balance with narrow BOS, speed of movement.    SEthel Rana DPT 07/03/22 1:15 PM

## 2022-07-05 ENCOUNTER — Encounter: Payer: Self-pay | Admitting: Physical Therapy

## 2022-07-05 ENCOUNTER — Ambulatory Visit: Payer: Medicare Other | Admitting: Physical Therapy

## 2022-07-05 DIAGNOSIS — R262 Difficulty in walking, not elsewhere classified: Secondary | ICD-10-CM

## 2022-07-05 DIAGNOSIS — R600 Localized edema: Secondary | ICD-10-CM

## 2022-07-05 DIAGNOSIS — R2681 Unsteadiness on feet: Secondary | ICD-10-CM

## 2022-07-05 DIAGNOSIS — M6281 Muscle weakness (generalized): Secondary | ICD-10-CM

## 2022-07-05 DIAGNOSIS — R293 Abnormal posture: Secondary | ICD-10-CM

## 2022-07-05 DIAGNOSIS — R278 Other lack of coordination: Secondary | ICD-10-CM

## 2022-07-05 NOTE — Therapy (Signed)
OUTPATIENT PHYSICAL THERAPY NEURO TREATMENT Progress Note Reporting Period 04/21/22 to 05/23/22  See note below for Objective Data and Assessment of Progress/Goals.      Patient Name: April Hunt MRN: [id number] DOB:Feb 27, 1951, 71 y.o., female Today's Date: 07/05/2022   PCP: Ronnald Ramp, [name] REFERRING PROVIDER: Damita Lack, MD    PT End of Session - 07/05/22 1234     Visit Number 17    Date for PT Re-Evaluation 07/14/22    PT Start Time 1231    PT Stop Time 1310    PT Time Calculation (min) 39 min    Equipment Utilized During Treatment Gait belt    Activity Tolerance Patient tolerated treatment well    Behavior During Therapy WFL for tasks assessed/performed              Past Medical History:  Diagnosis Date   Allergy    seasonal   Arthritis    Asthma 12/25/2011   Asthma 12/25/2011   Formatting of this note might be different from the original. Last Assessment & Plan:  Currently stable with PRN albuterol.   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   Pure hypercholesterolemia    Refusal of blood transfusions as patient is Jehovah's Witness     Sinusitis 03/04/2013   Tinea pedis 03/19/2012   Tinea versicolor 03/19/2012   Past Surgical History:  Procedure Laterality Date   ABDOMINAL HYSTERECTOMY  1998   Still has cervix and both ovaries   APPENDECTOMY     BACK SURGERY     JOINT REPLACEMENT     scope bil knees   KNEE ARTHROSCOPY  1997   right knee   Lymph node resection  1999   benign   MENISCUS REPAIR  1995   left knee   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   DR CAFFREY   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   Procedure: RIGHT TOTAL KNEE ARTHROPLASTY;  Surgeon: Yvette Rack., MD;  Location: [place];  Service: Orthopedics;  Laterality: Right;   Patient Active Problem List   Diagnosis Date Noted   Acute stroke due to ischemia ([place]) 04/03/2022   Lumbar degenerative disc disease 03/07/2022   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   Post-menopause atrophic vaginitis 02/08/2017   GERD without esophagitis 03/07/2016   Essential hypertension 11/16/2014   Osteoarthritis of right knee 12/26/2013   Mild intermittent asthma without complication 77/82/4235    ONSET DATE: 04/05/2022   REFERRING DIAG: Diagnosis R29.90 (ICD-10-CM) - Stroke-like symptoms   THERAPY DIAG:  Abnormal posture  Difficulty in walking,  not elsewhere classified  Localized edema  Muscle weakness (generalized)  Other lack of coordination  Unsteadiness on feet  Rationale for Evaluation and Treatment Rehabilitation  SUBJECTIVE:                                                                                                                                                                                        SUBJECTIVE STATEMENT: Patient reports no new issues. Her L knee continues to hurt. She feels the Iontophoresis provides some temporary relief.  PERTINENT HISTORY: 71 year old Henry comes to the hospital with complaints of GERD, HTN, HLD, asthma, history of lumbar radiculopathy status post laminectomy/fusion came to [facility]  with complains of right facial numbness and tingling along with right lower extremity weakness.  Neurology recommended admitting patient for stroke work-up.  CT of the head was negative.  She was started on aspirin and Plavix.  Patient was seen by neurology team.  A1c 5.4, UDS negative.  MRI brain was also negative, LDL was 155.  Right-sided facial symptoms resolved but right lower extremity weakness which persisted therefore lumbar spine was ordered.  MRI spine showed disc bulge with slight neurocompression.  Case was discussed with Dr. Christella Noa from neurosurgery who recommended outpatient follow-up in 7 days of Decadron otherwise no acute surgical needs at this time. Later in the day had extensive discussion with patient regarding her care and her sister was present at bedside.  I explained to patient that she will need to follow-up outpatient with pain management for her lower back and right lower extremity pain and weakness, take 7 days of oral Decadron and get outpatient neurosurgery referral by her PCP. She also tells me that she tells me that she is not allergic to sulfa tells me that she is not allergic to aspirin as long as enteric-coated therefore willing to take aspirin and Plavix for 3 weeks followed by aspirin.  PAIN:  Are you having pain? Yes: NPRS scale: 5/10 Pain location: whole R side Pain description: cramping Aggravating factors: end of day Relieving factors: rest  PRECAUTIONS: None  WEIGHT BEARING RESTRICTIONS No  FALLS: Has patient fallen in last 6 months? No  LIVING ENVIRONMENT: Lives with: lives with their family Lives in: House/apartment Stairs: Yes: Internal: 12 steps; on right going up Has following equipment at home: Single point cane and Walker - 4 wheeled  PLOF: Independent  PATIENT GOALS Walk on her own, return to her normal routine.  OBJECTIVE:   DIAGNOSTIC FINDINGS: -CT head, MRI brain is negative for acute pathology.  CTA head and neck is also negative.   A1c 5.4, UDS negative, LDL 155.  Suspicion still remains  for TIA.  A.  A.  Neurology recommending aspirin Plavix for 3 weeks followed by aspirin. -MRI lumbar spine showed bulging of the disc with nerve root compression.  Discussed with Dr Christella Noa who reviewed her MRI- no surgical indication, ok for week long decadron and outptn follow up.  She follows at [facility] pain management for monthly epidural bupivacaine/Kenalog injections. --Echocardiogram normal ef  COGNITION: Overall cognitive status: Within functional limits for tasks assessed   SENSATION: Patient reports numbness on R face, arm and leg  COORDINATION: Mildly slow fingers to thumb   MUSCLE TONE: RLE: Within functional limits   MUSCLE LENGTH: Hamstrings: Right 50 deg; Left 80 deg  POSTURE: No Significant postural limitations  LOWER EXTREMITY ROM:    PROM grossly WFL, R HS tightness noted.  LOWER EXTREMITY MMT:  LLE WNL  MMT Right Eval RLE 06/14/22  Hip flexion 2 3-  Hip extension 2 3  Hip abduction 2 3-  Hip adduction    Hip internal rotation    Hip external rotation    Knee flexion 2 3  Knee extension 2 3+  Ankle dorsiflexion 2+ 3+  Ankle plantarflexion    Ankle inversion    Ankle eversion    (Blank rows = not tested)  BED MOBILITY: I   TRANSFERS: Assistive device utilized: Environmental consultant - 4 wheeled  Sit to stand: Modified independence Stand to sit: Modified independence Chair to chair: Modified independence  STAIRS:  Level of Assistance: Modified independence  Stair Negotiation Technique: Step to Pattern with Bilateral Rails  Number of Stairs: 12   Height of Stairs: 6  Comments: Per patient report, to access her home.  GAIT: Gait pattern: step to pattern, decreased arm swing- Right, decreased arm swing- Left, decreased step length- Right, decreased stance time- Right, Right hip hike, trendelenburg, lateral hip instability, and trunk flexed Distance walked: 43' Assistive device utilized: Environmental consultant - 4  wheeled Level of assistance: Modified independence Comments: Patient moves very slow, approximately 2 minutes to travel 80'  FUNCTIONAL TESTs:  5 times sit to stand: 41.08 Timed up and go (TUG): TBD Berg Balance Scale: TBD (31 in inpatient)  TODAY'S TREATMENT:  07/05/22 NuStep L5 x 6 minutes Standing alternating step taps while holding 1# weight in each hand. Performed in the corner for safety, 10 reps each, 4" step Supine march with pelvic tilt Quadruped  with alt arm reach, alternating leg ext, bird dog- had difficulty lifting Rle off mat. Runner's stride onto RLE on BOSU with LUE support x 10 Ionto patch to L medial , distal knee.  07/03/22 NuStep L5 x 6 minutes Alternating taps on 4" step 10 reps each holding 3# WATE bar in BUE. Step ups onto 4" step, alternating feet, x 10 reps Quick steps forward and back leading with RLE x 30 reps, holding 3# WATE bar in BUE Walk with SPC, emphasizing high knee lift and knee ext on R. X 80' Ionto patch to L distal, med knee  06/28/22 NuStep L5 x 6 minutes Sit to stand from elevated mat with LLE on 6" step x 10 Standing rotation to R and back with LLE on 6" step x 10 Heel raises with LLE on 6" step, 10 reps Standing side steps on floor mat, 4 x each direction Alternately tapping organe cone with each leg. Great difficulty lifitng RLE high enough Pillow case slide into hip flexion on mat x 10 Able to lift Rle onto cone, still difficult\ Bicycle with RLE only, mod A for the lifting  part of circle.  06/14/22 NuStep  L5 x 6 minutes MMT-RLE- see chart Supine <> L sidelie while holidng RLE in the air throughout x 4.Standing hip abd with BUE support on counter, 10 reps each, Standing march while holding 3# WATE bar, 10 reps each Mini tramp-jump switch in stride and jump Abd/add 10 each with BUE support  06/12/22 NuStep L5 x 6 minutes Standing R hip flexion, slide pillowcase forward, then lift foot into hip flexion, slide back. On  treadmill, L foot on the side of the belt, BUE support. Push belt backwards and forwards with RLE, required mod A, 5 reps each way. Resisted step up with 4" step for RLE. 10 reps. Improved when she increased her speed of movement. Ambulated 1 x 100' without AD, no unsteadiness Stepping in box over X on the floor with Tband, clockwise, counterclockwise, changing directions upon therapist command, no unsteadiness noted. Ambulated outdoors x 500' without AD, Occasional CGA on unlevel surface, no LOB  06/01/22 NuStep L5 x 6 minutes Stair climbing, had difficulty with R hip flexion to step up with RLE Standing R hip flexion, slide pillowcase forward, then lift foot into hip flexion, slide back. Mini squats in Parallel bars, most weight on R, 10 reps Heel raises with most weight on R, 10 reps Walk with 3# WATE bar, x 150', improved step length. Shuttle walk 10 x 15', moving cones from one surface to the other-much improved gait when she was distracted.  05/26/22 NuStep L5 x 6 minutes Resisted gait against 20#, 3 reps each direction. Heel raise/Toe raise 2 x 10 reps, No UE support, CGA Ambulated 1 x 80' while chest pressing 2# WATE bar Backwards amb x 20' while chest pressing 2# WATE bar Walking on airex beams forward and back x 4 Side stepping on air ex beam R/L x 4 each Placed R foot on Red physioball in sit, rolling forward and back, side to side x 10 each. Difficulty lifting to place it on ball, and needed Min a to control add Ionto patch to R lateral knee   05/23/22 NuStep L5 x 6 minutes Functional re-assessment performed, see goals for results. Ambulated with 5# weight in BUE to facilitate trunk activation and stability.  05/17/22 NuStep L5 x 6 minutes. LLE step taps onto 4" step, BUE support on RW for balance, CGA, 10 nreps RLE step ups onto 4" step with BUE support, encouraged to minimize BUE support 10 reps. Seated hip flexion, lifting R foot up onto 4" step x 10, increased effort  required. Step ups onto Airex pad and off while holding 2# WATE bar in BUE. Lateral steps ups onto and off Airex pad holding 2# WATE bar, occasional min A for balance. Heel raises while holding 2# WATE bar, 2 x 10 Ambulated while holding 2# WATE bar-forward, back and B side step, turns, CGA, total of approximately 120'. No unsteadiness noted. Ionto to R lateral knee  05/15/22 NuStep L4 with U and LE x 6 minutes Sit to stand x 5 from progressively lower surface, no UE support. Patient reported mild L medial knee pain, but did not fall into IR TKE in stand, 10 each, Green Tband Side to side step against green Tband at knees, 10 each way. Mini tramp- jump feet out/in and alternating stride, 2 x 5 reps each way. With rest breaks. Walking while holding 3# wate bar. Chest press while walking. 44' Iontophoresis to L medial knee.   05/12/22 Walking with 2# WATE bar, 2 x  25', CGA, stp through gait. Standing hold 2# WATE Bar- forward lunges x 8 with each leg, then lateral lunges x 8 with each leg. NuStep L3 x 6 minutes, U and LE Paloff press with R side closest to weight 5#, 10 reps. Mini tramp jumping then jump abd/add. Able speed up movements with repetition, 3 x 5 sets with Bue support and CGA with belt.  05/10/22 Nustep L1-> progressing to L4 x5 minutes R LE only (unable to push with R LE when both legs were on the machine, but able to perform with R LE alone on Nustep)  -Step up onto 4 inch step with BUEs x5 R Mod verbal cues for lifting R LE high enough  -Toe taps on 8 inch step with L LE while maintaining stance R LE U UE support x10  -Standing hip ABD x10 BLEs  - LAQs 3# 1x10 L LE unable R LE even with 0# - seated mini-marches R LE 0#  1x10 - attempted tandem stance in bars- declined to perform without U UE support  - B heel and toe raises BUE support x10 each  - gait training in // bars with U UE progressing to one finger x2 full laps  - side steps in // bars 2 full laps two finger  support   05/05/22 NuStep L3 x 6 minutes Supine-RLE press into red physio ball while lifting LLE. Required mod facilitation initially, improved to light min Standing-Step back and out with LLE, turn back and reach with LUE to touch the target behind. 8 times to L and 5 times to R. Required increased A turning to R and mod facilitation to weight shift onto the leg once turned. Gait training with shopping cart, 1 x 200', patient initally with more normalized gait pattern, but as she progressed she started dragging R foot more. Standing at 4" step, place R foot on step. Step ups with BUE support. Improved in her R activation and use with repetition.  Placed L foot on step, R knee flex/ext.  05/03/22 Nustep L3 x35mns  STS w/red ball push out, CGA 2x5 Calf raises holding on to rollator 2x10  Step ups 4" up with L, difficulty picking up R   Walking with weighted shopping cart  [facility] steps [facility]  04/27/22 Nustep level 3 x 6 minutes Ball b/n knees squeeze LAQ on the left 2.5#, AAROM LAQ on the right Right ankle red tband PF, AAROM DF right ankle Marching on the left 2.5#, right AAROM Stairs 6" and 4" up and down with CGA, one at a time Supine feet on ball K2C, trunk rotation, small bridges, isometric abs SAQ with left leg assist 3x5  04/25/22 NuStep at L2 x 5 minutes, required Vc and TC to release LLE when attempting to push with R TUG-23.34 BERG 37 Seated knee flexion and ext with R foot on towel to decrease friction-very slow and labored in both directions, x 3 Seated heel raise/toe raise Standing stepping forward and back. Sit to/from stand x 5  PATIENT EDUCATION: Education details: results of re-assessment Person educated: Patient Education method: Explanation and Demonstration Education comprehension: verbalized understanding and returned demonstration   HOME EXERCISE PROGRAM: Verbal-hip flexion, knee ext, ankle DF/PF-all in sitting. Consciously use RLE in all activities as  much as possible.  [id number] GOALS: Goals reviewed with patient? Yes  SHORT TERM GOALS: Target date: 05/23/2022  I with basic HEP Baseline: Goal status: met  2.  5x STS in < 24 seconds  Baseline: 41,  05/23/22-19.09 Goal status: met  LONG TERM GOALS: Target date: 07/14/2022  I with final HEP Baseline:  Goal status: ongoing  2.  5 x STS in < 15 seconds Baseline: 41, 05/23/22-19.09 Goal status:ongoing  3.  Increase RLE strength to at least 4-/5 throughout Baseline: 2/5, 3-/5-8/25, 05/23/22-3-/5 with MMT Goal status: ongoing  4.  Paitent will ambulate x at least 400' with LRAD, MI, minimized gait deviations, functional speed. Baseline: 05/23/22-300', Rollator, mildly slow, fatigued Goal status: ongoing  5.  Patient will score at least 40/56 on BERG balance assessment. Baseline: 31/56 in hospital, 8/8-37, 05/23/22-42/56 Goal status: met  6.  Paitent will complete TUG in < 15 sec with LRAD. Baseline: 23.34, 05/23/22-13.38 Goal status: met   ASSESSMENT:  CLINICAL IMPRESSION: Patient imporving, continues to have L knee pain- to see Dr this week. Progressed HEP to include abdominal stability and more RLE strenghtening.  OBJECTIVE IMPAIRMENTS Abnormal gait, decreased activity tolerance, decreased balance, decreased coordination, decreased endurance, decreased mobility, difficulty walking, decreased strength, impaired UE functional use, improper body mechanics, postural dysfunction, and pain.   ACTIVITY LIMITATIONS carrying, lifting, bending, standing, squatting, sleeping, stairs, transfers, bathing, toileting, dressing, reach over head, hygiene/grooming, locomotion level, and caring for others  PARTICIPATION LIMITATIONS: meal prep, cleaning, laundry, shopping, and community activity  PERSONAL FACTORS Age are also affecting patient's functional outcome.   REHAB POTENTIAL: Good  CLINICAL DECISION MAKING: Evolving/moderate complexity  EVALUATION COMPLEXITY: Moderate  PLAN: PT  FREQUENCY: 2x/week  PT DURATION: 12 weeks  PLANNED INTERVENTIONS: Therapeutic exercises, Therapeutic activity, Neuromuscular re-education, Balance training, Gait training, Patient/Family education, Self Care, Joint mobilization, Stair training, Ionotophoresis 54m/ml Dexamethasone, and Manual therapy  PLAN FOR NEXT SESSION: Strengthening for RLE, stair training, balance with narrow BOS, speed of movement.    SEthel RanaDPT 07/05/22 1:14 PM

## 2022-07-13 ENCOUNTER — Ambulatory Visit: Payer: Medicare Other | Admitting: Physical Therapy

## 2022-07-13 ENCOUNTER — Encounter: Payer: Self-pay | Admitting: Physical Therapy

## 2022-07-13 DIAGNOSIS — R600 Localized edema: Secondary | ICD-10-CM

## 2022-07-13 DIAGNOSIS — R2681 Unsteadiness on feet: Secondary | ICD-10-CM

## 2022-07-13 DIAGNOSIS — R262 Difficulty in walking, not elsewhere classified: Secondary | ICD-10-CM

## 2022-07-13 DIAGNOSIS — R278 Other lack of coordination: Secondary | ICD-10-CM

## 2022-07-13 DIAGNOSIS — R293 Abnormal posture: Secondary | ICD-10-CM | POA: Diagnosis not present

## 2022-07-13 DIAGNOSIS — I69351 Hemiplegia and hemiparesis following cerebral infarction affecting right dominant side: Secondary | ICD-10-CM

## 2022-07-13 DIAGNOSIS — M6281 Muscle weakness (generalized): Secondary | ICD-10-CM

## 2022-07-13 NOTE — Therapy (Signed)
OUTPATIENT PHYSICAL THERAPY NEURO TREATMENT Progress Note Reporting Period 04/21/22 to 05/23/22  See note below for Objective Data and Assessment of Progress/Goals.      Patient Name: April Hunt MRN: [id number] DOB:1951-02-12, 71 y.o., female Today's Date: 07/13/2022   PCP: April Hunt, [name] REFERRING PROVIDER: Damita Lack, MD    PT End of Session - 07/13/22 0913     Visit Number 18    Date for PT Re-Evaluation 07/14/22    PT Start Time 0845    PT Stop Time 0926    PT Time Calculation (min) 41 min    Activity Tolerance Patient tolerated treatment well    Behavior During Therapy [facility] for tasks assessed/performed               Past Medical History:  Diagnosis Date   Allergy    seasonal   Arthritis    Asthma 12/25/2011   Asthma 12/25/2011   Formatting of this note might be different from the original. Last Assessment & Plan:  Currently stable with PRN albuterol.   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   Pure hypercholesterolemia    Refusal of blood transfusions as patient is Jehovah's Witness    Sinusitis 03/04/2013   Tinea pedis 03/19/2012    Tinea versicolor 03/19/2012   Past Surgical History:  Procedure Laterality Date   ABDOMINAL HYSTERECTOMY  1998   Still has cervix and both ovaries   APPENDECTOMY     BACK SURGERY     JOINT REPLACEMENT     scope bil knees   KNEE ARTHROSCOPY  1997   right knee   Lymph node resection  1999   benign   MENISCUS REPAIR  1995   left knee   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   DR CAFFREY   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   Procedure: RIGHT TOTAL KNEE ARTHROPLASTY;  Surgeon: April Hunt., MD;  Location: [place];  Service: Orthopedics;  Laterality: Right;   Patient Active Problem List   Diagnosis Date Noted   Acute stroke due to ischemia ([place]) 04/03/2022   Lumbar degenerative disc disease 03/07/2022   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   Post-menopause atrophic vaginitis 02/08/2017   GERD without esophagitis 03/07/2016   Essential hypertension 11/16/2014   Osteoarthritis of right knee 12/26/2013   Mild intermittent asthma without complication 62/22/9798    ONSET DATE: 04/05/2022   REFERRING DIAG: Diagnosis R29.90 (ICD-10-CM) - Stroke-like symptoms   THERAPY DIAG:  Abnormal posture  Difficulty in walking, not elsewhere classified  Localized edema  Muscle  weakness (generalized)  Hemiplegia and hemiparesis following cerebral infarction affecting right dominant side (HCC)  Unsteadiness on feet  Other Hunt of coordination  Rationale for Evaluation and Treatment Rehabilitation  SUBJECTIVE:                                                                                                                                                                                        SUBJECTIVE STATEMENT: Patient reports no new issues. Her L knee continues to hurt. She has an appointment with an ortho Dr coming up to assess it.  PERTINENT HISTORY: 71 year old [place] comes to the hospital with complaints of GERD, HTN, HLD, asthma, history of lumbar  radiculopathy status post laminectomy/fusion came to [facility] with complains of right facial numbness and tingling along with right lower extremity weakness.  Neurology recommended admitting patient for stroke work-up.  CT of the head was negative.  She was started on aspirin and Plavix.  Patient was seen by neurology team.  A1c 5.4, UDS negative.  MRI brain was also negative, LDL was 155.  Right-sided facial symptoms resolved but right lower extremity weakness which persisted therefore lumbar spine was ordered.  MRI spine showed disc bulge with slight neurocompression.  Case was discussed with Dr. Christella Noa from neurosurgery who recommended outpatient follow-up in 7 days of Decadron otherwise no acute surgical needs at this time. Later in the day had extensive discussion with patient regarding her care and her sister was present at bedside.  I explained to patient that she will need to follow-up outpatient with pain management for her lower back and right lower extremity pain and weakness, take 7 days of oral Decadron and get outpatient neurosurgery referral by her PCP. She also tells me that she tells me that she is not allergic to sulfa tells me that she is not allergic to aspirin as long as enteric-coated therefore willing to take aspirin and Plavix for 3 weeks followed by aspirin.  PAIN:  Are you having pain? Yes: NPRS scale: 5/10 Pain location: whole R side Pain description: cramping Aggravating factors: end of day Relieving factors: rest  PRECAUTIONS: None  WEIGHT BEARING RESTRICTIONS No  FALLS: Has patient fallen in last 6 months? No  LIVING ENVIRONMENT: Lives with: lives with their family Lives in: House/apartment Stairs: Yes: Internal: 12 steps; on right going up Has following equipment at home: Single point cane and Walker - 4 wheeled  PLOF: Independent  PATIENT GOALS Walk on her own, return to her normal routine.  OBJECTIVE:   DIAGNOSTIC FINDINGS: -CT head, MRI brain is negative  for acute pathology.  CTA head and neck is also negative.  A1c  5.4, UDS negative, LDL 155.  Suspicion still remains for TIA.  A.  A.  Neurology recommending aspirin Plavix for 3 weeks followed by aspirin. -MRI lumbar spine showed bulging of the disc with nerve root compression.  Discussed with Dr Christella Noa who reviewed her MRI- no surgical indication, ok for week long decadron and outptn follow up.  She follows at [facility] pain management for monthly epidural bupivacaine/Kenalog injections. --Echocardiogram normal ef  COGNITION: Overall cognitive status: Within functional limits for tasks assessed   SENSATION: Patient reports numbness on R face, arm and leg  COORDINATION: Mildly slow fingers to thumb   MUSCLE TONE: RLE: Within functional limits   MUSCLE LENGTH: Hamstrings: Right 50 deg; Left 80 deg  POSTURE: No Significant postural limitations  LOWER EXTREMITY ROM:    PROM grossly WFL, R HS tightness noted.  LOWER EXTREMITY MMT:  LLE WNL  MMT Right Eval RLE 06/14/22  Hip flexion 2 3-  Hip extension 2 3  Hip abduction 2 3-  Hip adduction    Hip internal rotation    Hip external rotation    Knee flexion 2 3  Knee extension 2 3+  Ankle dorsiflexion 2+ 3+  Ankle plantarflexion    Ankle inversion    Ankle eversion    (Blank rows = not tested)  BED MOBILITY: I   TRANSFERS: Assistive device utilized: Environmental consultant - 4 wheeled  Sit to stand: Modified independence Stand to sit: Modified independence Chair to chair: Modified independence  STAIRS:  Level of Assistance: Modified independence  Stair Negotiation Technique: Step to Pattern with Bilateral Rails  Number of Stairs: 12   Height of Stairs: 6  Comments: Per patient report, to access her home.  GAIT: Gait pattern: step to pattern, decreased arm swing- Right, decreased arm swing- Left, decreased step length- Right, decreased stance time- Right, Right hip hike, trendelenburg, lateral hip instability, and trunk  flexed Distance walked: 55' Assistive device utilized: Environmental consultant - 4 wheeled Level of assistance: Modified independence Comments: Patient moves very slow, approximately 2 minutes to travel 23'  FUNCTIONAL TESTs:  5 times sit to stand: 41.08 Timed up and go (TUG): TBD Berg Balance Scale: TBD (31 in inpatient)  TODAY'S TREATMENT:  07/13/22 Recumbent bike L1 x 6 minutes B knee flexion against 15# 2 x 10 reps B knee ext 5# x 5 reps, required A from LLE  Place R foot on ball and roll forward/back, side to side x 10 reps each Step ups onto 4" step from stride position with RLE behind, weight shift back onto RLE, roll forward and TO. 2 x 10 reps, required rest breaks and fatigued after second set of 10 Standing march while holding 2# weights in each hand at waist height to engage trunk, 2 x 10, VC to drive R knee up. Gait training x 150' with increased pace. Ionto patch to L knee  07/05/22 NuStep L5 x 6 minutes Standing alternating step taps while holding 1# weight in each hand. Performed in the corner for safety, 10 reps each, 4" step Supine march with pelvic tilt Quadruped  with alt arm reach, alternating leg ext, bird dog- had difficulty lifting Rle off mat. Runner's stride onto RLE on BOSU with LUE support x 10 Ionto patch to L medial , distal knee.  07/03/22 NuStep L5 x 6 minutes Alternating taps on 4" step 10 reps each holding 3# WATE bar in BUE. Step ups onto 4" step, alternating feet, x 10 reps Quick steps forward and back leading with  RLE x 30 reps, holding 3# WATE bar in BUE Walk with SPC, emphasizing high knee lift and knee ext on R. X 80' Ionto patch to L distal, med knee  06/28/22 NuStep L5 x 6 minutes Sit to stand from elevated mat with LLE on 6" step x 10 Standing rotation to R and back with LLE on 6" step x 10 Heel raises with LLE on 6" step, 10 reps Standing side steps on floor mat, 4 x each direction Alternately tapping organe cone with each leg. Great difficulty  lifitng RLE high enough Pillow case slide into hip flexion on mat x 10 Able to lift Rle onto cone, still difficult\ Bicycle with RLE only, mod A for the lifting part of circle.  06/14/22 NuStep  L5 x 6 minutes MMT-RLE- see chart Supine <> L sidelie while holidng RLE in the air throughout x 4.Standing hip abd with BUE support on counter, 10 reps each, Standing march while holding 3# WATE bar, 10 reps each Mini tramp-jump switch in stride and jump Abd/add 10 each with BUE support  06/12/22 NuStep L5 x 6 minutes Standing R hip flexion, slide pillowcase forward, then lift foot into hip flexion, slide back. On treadmill, L foot on the side of the belt, BUE support. Push belt backwards and forwards with RLE, required mod A, 5 reps each way. Resisted step up with 4" step for RLE. 10 reps. Improved when she increased her speed of movement. Ambulated 1 x 100' without AD, no unsteadiness Stepping in box over X on the floor with Tband, clockwise, counterclockwise, changing directions upon therapist command, no unsteadiness noted. Ambulated outdoors x 500' without AD, Occasional CGA on unlevel surface, no LOB  06/01/22 NuStep L5 x 6 minutes Stair climbing, had difficulty with R hip flexion to step up with RLE Standing R hip flexion, slide pillowcase forward, then lift foot into hip flexion, slide back. Mini squats in Parallel bars, most weight on R, 10 reps Heel raises with most weight on R, 10 reps Walk with 3# WATE bar, x 150', improved step length. Shuttle walk 10 x 15', moving cones from one surface to the other-much improved gait when she was distracted.  05/26/22 NuStep L5 x 6 minutes Resisted gait against 20#, 3 reps each direction. Heel raise/Toe raise 2 x 10 reps, No UE support, CGA Ambulated 1 x 80' while chest pressing 2# WATE bar Backwards amb x 20' while chest pressing 2# WATE bar Walking on airex beams forward and back x 4 Side stepping on air ex beam R/L x 4 each Placed R foot on  Red physioball in sit, rolling forward and back, side to side x 10 each. Difficulty lifting to place it on ball, and needed Min a to control add Ionto patch to R lateral knee   05/23/22 NuStep L5 x 6 minutes Functional re-assessment performed, see goals for results. Ambulated with 5# weight in BUE to facilitate trunk activation and stability.  05/17/22 NuStep L5 x 6 minutes. LLE step taps onto 4" step, BUE support on RW for balance, CGA, 10 nreps RLE step ups onto 4" step with BUE support, encouraged to minimize BUE support 10 reps. Seated hip flexion, lifting R foot up onto 4" step x 10, increased effort required. Step ups onto Airex pad and off while holding 2# WATE bar in BUE. Lateral steps ups onto and off Airex pad holding 2# WATE bar, occasional min A for balance. Heel raises while holding 2# WATE bar, 2 x 10  Ambulated while holding 2# WATE bar-forward, back and B side step, turns, CGA, total of approximately 120'. No unsteadiness noted. Ionto to R lateral knee  05/15/22 NuStep L4 with U and LE x 6 minutes Sit to stand x 5 from progressively lower surface, no UE support. Patient reported mild L medial knee pain, but did not fall into IR TKE in stand, 10 each, Green Tband Side to side step against green Tband at knees, 10 each way. Mini tramp- jump feet out/in and alternating stride, 2 x 5 reps each way. With rest breaks. Walking while holding 3# wate bar. Chest press while walking. 63' Iontophoresis to L medial knee.   05/12/22 Walking with 2# WATE bar, 2 x 25', CGA, stp through gait. Standing hold 2# WATE Bar- forward lunges x 8 with each leg, then lateral lunges x 8 with each leg. NuStep L3 x 6 minutes, U and LE Paloff press with R side closest to weight 5#, 10 reps. Mini tramp jumping then jump abd/add. Able speed up movements with repetition, 3 x 5 sets with Bue support and CGA with belt.  05/10/22 Nustep L1-> progressing to L4 x5 minutes R LE only (unable to push with R LE  when both legs were on the machine, but able to perform with R LE alone on Nustep)  -Step up onto 4 inch step with BUEs x5 R Mod verbal cues for lifting R LE high enough  -Toe taps on 8 inch step with L LE while maintaining stance R LE U UE support x10  -Standing hip ABD x10 BLEs  - LAQs 3# 1x10 L LE unable R LE even with 0# - seated mini-marches R LE 0#  1x10 - attempted tandem stance in bars- declined to perform without U UE support  - B heel and toe raises BUE support x10 each  - gait training in // bars with U UE progressing to one finger x2 full laps  - side steps in // bars 2 full laps two finger support   05/05/22 NuStep L3 x 6 minutes Supine-RLE press into red physio ball while lifting LLE. Required mod facilitation initially, improved to light min Standing-Step back and out with LLE, turn back and reach with LUE to touch the target behind. 8 times to L and 5 times to R. Required increased A turning to R and mod facilitation to weight shift onto the leg once turned. Gait training with shopping cart, 1 x 200', patient initally with more normalized gait pattern, but as she progressed she started dragging R foot more. Standing at 4" step, place R foot on step. Step ups with BUE support. Improved in her R activation and use with repetition.  Placed L foot on step, R knee flex/ext.  05/03/22 Nustep L3 x57mns  STS w/red ball push out, CGA 2x5 Calf raises holding on to rollator 2x10  Step ups 4" up with L, difficulty picking up R   Walking with weighted shopping cart  [facility]  04/27/22 Nustep level 3 x 6 minutes Ball b/n knees squeeze LAQ on the left 2.5#, AAROM LAQ on the right Right ankle red tband PF, AAROM DF right ankle Marching on the left 2.5#, right AAROM Stairs 6" and 4" up and down with CGA, one at a time Supine feet on ball K2C, trunk rotation, small bridges, isometric abs SAQ with left leg assist 3x5  04/25/22 NuStep at L2 x 5 minutes, required Vc and TC  to release LLE when  attempting to push with R TUG-23.34 BERG 37 Seated knee flexion and ext with R foot on towel to decrease friction-very slow and labored in both directions, x 3 Seated heel raise/toe raise Standing stepping forward and back. Sit to/from stand x 5  PATIENT EDUCATION: Education details: results of re-assessment Person educated: Patient Education method: Explanation and Demonstration Education comprehension: verbalized understanding and returned demonstration   HOME EXERCISE PROGRAM: Verbal-hip flexion, knee ext, ankle DF/PF-all in sitting. Consciously use RLE in all activities as much as possible.  [id number]  GOALS: Goals reviewed with patient? Yes  SHORT TERM GOALS: Target date: 05/23/2022  I with basic HEP Baseline: Goal status: met  2.  5x STS in < 24 seconds Baseline: 41,  05/23/22-19.09 Goal status: met  LONG TERM GOALS: Target date: 07/14/2022  I with final HEP Baseline:  Goal status: ongoing  2.  5 x STS in < 15 seconds Baseline: 41, 05/23/22-19.09 Goal status:ongoing  3.  Increase RLE strength to at least 4-/5 throughout Baseline: 2/5, 3-/5-8/25, 05/23/22-3-/5 with MMT Goal status: ongoing  4.  Paitent will ambulate x at least 400' with LRAD, MI, minimized gait deviations, functional speed. Baseline: 05/23/22-300', Rollator, mildly slow, fatigued Goal status: ongoing  5.  Patient will score at least 40/56 on BERG balance assessment. Baseline: 31/56 in hospital, 8/8-37, 05/23/22-42/56 Goal status: met  6.  Paitent will complete TUG in < 15 sec with LRAD. Baseline: 23.34, 05/23/22-13.38 Goal status: met   ASSESSMENT:  CLINICAL IMPRESSION: Patient continues to improve in her control, still has difficulty with R hip flexion, but improved automatic functoin.  OBJECTIVE IMPAIRMENTS Abnormal gait, decreased activity tolerance, decreased balance, decreased coordination, decreased endurance, decreased mobility, difficulty walking, decreased strength,  impaired UE functional use, improper body mechanics, postural dysfunction, and pain.   ACTIVITY LIMITATIONS carrying, lifting, bending, standing, squatting, sleeping, stairs, transfers, bathing, toileting, dressing, reach over head, hygiene/grooming, locomotion level, and caring for others  PARTICIPATION LIMITATIONS: meal prep, cleaning, laundry, shopping, and community activity  PERSONAL FACTORS Age are also affecting patient's functional outcome.   REHAB POTENTIAL: Good  CLINICAL DECISION MAKING: Evolving/moderate complexity  EVALUATION COMPLEXITY: Moderate  PLAN: PT FREQUENCY: 2x/week  PT DURATION: 12 weeks  PLANNED INTERVENTIONS: Therapeutic exercises, Therapeutic activity, Neuromuscular re-education, Balance training, Gait training, Patient/Family education, Self Care, Joint mobilization, Stair training, Ionotophoresis 87m/ml Dexamethasone, and Manual therapy  PLAN FOR NEXT SESSION: Strengthening for RLE, stair training, balance with narrow BOS, speed of movement.    SEthel RanaDPT 07/13/22 9:31 AM

## 2022-07-14 ENCOUNTER — Encounter: Payer: Self-pay | Admitting: Physical Therapy

## 2022-07-14 ENCOUNTER — Ambulatory Visit: Payer: Medicare Other | Admitting: Physical Therapy

## 2022-07-14 DIAGNOSIS — R600 Localized edema: Secondary | ICD-10-CM

## 2022-07-14 DIAGNOSIS — I69351 Hemiplegia and hemiparesis following cerebral infarction affecting right dominant side: Secondary | ICD-10-CM

## 2022-07-14 DIAGNOSIS — M6281 Muscle weakness (generalized): Secondary | ICD-10-CM

## 2022-07-14 DIAGNOSIS — R293 Abnormal posture: Secondary | ICD-10-CM

## 2022-07-14 DIAGNOSIS — R278 Other lack of coordination: Secondary | ICD-10-CM

## 2022-07-14 DIAGNOSIS — R2681 Unsteadiness on feet: Secondary | ICD-10-CM

## 2022-07-14 DIAGNOSIS — R262 Difficulty in walking, not elsewhere classified: Secondary | ICD-10-CM

## 2022-07-14 NOTE — Therapy (Signed)
OUTPATIENT PHYSICAL THERAPY NEURO TREATMENT Progress Note Reporting Period 04/21/22 to 05/23/22  See note below for Objective Data and Assessment of Progress/Goals.    PHYSICAL THERAPY DISCHARGE SUMMARY  Visits from Start of Care: 19  Current functional level related to goals / functional outcomes: Patient with excellent progress toward LTG.   Remaining deficits: Weakness   Education / Equipment: HEP   Patient agrees to discharge. Patient goals were partially met. Patient is being discharged due to maximized rehab potential.    Patient Name: April Hunt MRN: [id number] DOB:1951-09-09, 71 y.o., female Today's Date: 07/14/2022   PCP: Ronnald Ramp, [name] REFERRING PROVIDER: Damita Lack, MD    PT End of Session - 07/14/22 0848     Visit Number 19    Date for PT Re-Evaluation 07/14/22    PT Start Time 0840    PT Stop Time 0922    PT Time Calculation (min) 42 min    Activity Tolerance Patient tolerated treatment well    Behavior During Therapy [facility] for tasks assessed/performed                Past Medical History:  Diagnosis Date   Allergy    seasonal   Arthritis    Asthma 12/25/2011   Asthma 12/25/2011   Formatting of this note might be different from the original. Last Assessment & Plan:  Currently stable with PRN albuterol.   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   Pure hypercholesterolemia    Refusal of blood transfusions as patient is Jehovah's Witness    Sinusitis 03/04/2013   Tinea pedis 03/19/2012   Tinea versicolor 03/19/2012   Past Surgical History:  Procedure Laterality Date   ABDOMINAL HYSTERECTOMY  1998   Still has cervix and both ovaries   APPENDECTOMY     BACK SURGERY     JOINT REPLACEMENT     scope bil knees   KNEE ARTHROSCOPY  1997   right knee   Lymph node resection  1999   benign   MENISCUS REPAIR  1995   left knee   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   DR CAFFREY   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   Procedure: RIGHT TOTAL KNEE ARTHROPLASTY;  Surgeon: Yvette Rack., MD;  Location: [facility];  Service: Orthopedics;  Laterality: Right;   Patient Active Problem List   Diagnosis Date Noted   Acute stroke due to ischemia ([place]) 04/03/2022   Lumbar degenerative disc disease 03/07/2022   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   Post-menopause atrophic vaginitis 02/08/2017  GERD without esophagitis 03/07/2016   Essential hypertension 11/16/2014   Osteoarthritis of right knee 12/26/2013   Mild intermittent asthma without complication 45/36/4680    ONSET DATE: 04/05/2022   REFERRING DIAG: Diagnosis R29.90 (ICD-10-CM) - Stroke-like symptoms   THERAPY DIAG:  Abnormal posture  Difficulty in walking, not elsewhere classified  Localized edema  Muscle weakness (generalized)  Hemiplegia and hemiparesis following cerebral infarction affecting right dominant side (HCC)  Unsteadiness on feet  Other lack of coordination  Rationale for Evaluation and Treatment Rehabilitation  SUBJECTIVE:                                                                                                                                                                                         SUBJECTIVE STATEMENT: Patient reports no new issues. Her L knee continues to hurt. She has an appointment with an ortho Dr coming up to assess it.  PERTINENT HISTORY: 71 year old April Hunt comes to the hospital with complaints of GERD, HTN, HLD, asthma, history of lumbar radiculopathy status post laminectomy/fusion came to [facility] with complains of right facial numbness and tingling along with right lower extremity weakness.  Neurology recommended admitting patient for stroke work-up.  CT of the head was negative.  She was started on aspirin and Plavix.  Patient was seen by neurology team.  A1c 5.4, UDS negative.  MRI brain was also negative, LDL was 155.  Right-sided facial symptoms resolved but right lower extremity weakness which persisted therefore lumbar spine was ordered.  MRI spine showed disc bulge with slight neurocompression.  Case was discussed with Dr. Christella Noa from neurosurgery who recommended outpatient follow-up in 7 days of Decadron otherwise no acute surgical needs at this time. Later in the day had extensive discussion with patient regarding her care and her sister was present at bedside.  I explained to patient that she will need to follow-up outpatient with pain management for her lower back and right lower extremity pain and weakness, take 7 days of oral Decadron and get outpatient neurosurgery referral by her PCP. She also tells me that she tells me that she is not allergic to sulfa tells me that she is not allergic to aspirin as long as enteric-coated therefore willing to take aspirin and Plavix for 3 weeks followed by aspirin.  PAIN:  Are you having pain? Yes: NPRS scale: 5/10 Pain location: whole R side Pain description: cramping Aggravating factors: end of day Relieving factors: rest  PRECAUTIONS: None  WEIGHT BEARING RESTRICTIONS No  FALLS: Has patient fallen in last 6 months?  No  LIVING ENVIRONMENT: Lives with: lives with their family Lives in: House/apartment Stairs: Yes: Internal:  12 steps; on right going up Has following equipment at home: Single point cane and Walker - 4 wheeled  PLOF: Independent  PATIENT GOALS Walk on her own, return to her normal routine.  OBJECTIVE:   DIAGNOSTIC FINDINGS: -CT head, MRI brain is negative for acute pathology.  CTA head and neck is also negative.  A1c 5.4, UDS negative, LDL 155.  Suspicion still remains for TIA.  A.  A.  Neurology recommending aspirin Plavix for 3 weeks followed by aspirin. -MRI lumbar spine showed bulging of the disc with nerve root compression.  Discussed with Dr Christella Noa who reviewed her MRI- no surgical indication, ok for week long decadron and outptn follow up.  She follows at [facility] pain management for monthly epidural bupivacaine/Kenalog injections. --Echocardiogram normal ef  COGNITION: Overall cognitive status: Within functional limits for tasks assessed   SENSATION: Patient reports numbness on R face, arm and leg  COORDINATION: Mildly slow fingers to thumb   MUSCLE TONE: RLE: Within functional limits   MUSCLE LENGTH: Hamstrings: Right 50 deg; Left 80 deg  POSTURE: No Significant postural limitations  LOWER EXTREMITY ROM:    PROM grossly WFL, R HS tightness noted.  LOWER EXTREMITY MMT:  LLE WNL  MMT Right Eval RLE 06/14/22  Hip flexion 2 3-  Hip extension 2 3  Hip abduction 2 3-  Hip adduction    Hip internal rotation    Hip external rotation    Knee flexion 2 3  Knee extension 2 3+  Ankle dorsiflexion 2+ 3+  Ankle plantarflexion    Ankle inversion    Ankle eversion    (Blank rows = not tested)  BED MOBILITY: I   TRANSFERS: Assistive device utilized: Environmental consultant - 4 wheeled  Sit to stand: Modified independence Stand to sit: Modified independence Chair to chair: Modified independence  STAIRS:  Level of Assistance: Modified independence  Stair Negotiation Technique:  Step to Pattern with Bilateral Rails  Number of Stairs: 12   Height of Stairs: 6  Comments: Per patient report, to access her home.  GAIT: Gait pattern: step to pattern, decreased arm swing- Right, decreased arm swing- Left, decreased step length- Right, decreased stance time- Right, Right hip hike, trendelenburg, lateral hip instability, and trunk flexed Distance walked: 54' Assistive device utilized: Environmental consultant - 4 wheeled Level of assistance: Modified independence Comments: Patient moves very slow, approximately 2 minutes to travel 76'  FUNCTIONAL TESTs:  5 times sit to stand: 41.08 Timed up and go (TUG): TBD Berg Balance Scale: TBD (31 in inpatient)  TODAY'S TREATMENT:  07/13/22 Recumbent bike L1 x 6 minutes B knee flexion against 15# 2 x 10 reps B knee ext 5# x 5 reps, required A from LLE  Place R foot on ball and roll forward/back, side to side x 10 reps each Step ups onto 4" step from stride position with RLE behind, weight shift back onto RLE, roll forward and TO. 2 x 10 reps, required rest breaks and fatigued after second set of 10 Standing march while holding 2# weights in each hand at waist height to engage trunk, 2 x 10, VC to drive R knee up. Gait training x 150' with increased pace. Ionto patch to L knee  07/05/22 NuStep L5 x 6 minutes Standing alternating step taps while holding 1# weight in each hand. Performed in the corner for safety, 10 reps each, 4" step Supine march with pelvic tilt Quadruped  with alt arm reach, alternating leg ext, bird dog- had difficulty lifting Rle  off mat. Runner's stride onto RLE on BOSU with LUE support x 10 Ionto patch to L medial , distal knee.  07/03/22 NuStep L5 x 6 minutes Alternating taps on 4" step 10 reps each holding 3# WATE bar in BUE. Step ups onto 4" step, alternating feet, x 10 reps Quick steps forward and back leading with RLE x 30 reps, holding 3# WATE bar in BUE Walk with SPC, emphasizing high knee lift and knee ext on  R. X 80' Ionto patch to L distal, med knee  06/28/22 NuStep L5 x 6 minutes Sit to stand from elevated mat with LLE on 6" step x 10 Standing rotation to R and back with LLE on 6" step x 10 Heel raises with LLE on 6" step, 10 reps Standing side steps on floor mat, 4 x each direction Alternately tapping organe cone with each leg. Great difficulty lifitng RLE high enough Pillow case slide into hip flexion on mat x 10 Able to lift Rle onto cone, still difficult_0 Bicycle with RLE only, mod A for the lifting part of circle.  06/14/22 NuStep  L5 x 6 minutes MMT-RLE- see chart Supine <> L sidelie while holidng RLE in the air throughout x 4.Standing hip abd with BUE support on counter, 10 reps each, Standing march while holding 3# WATE bar, 10 reps each Mini tramp-jump switch in stride and jump Abd/add 10 each with BUE support  06/12/22 NuStep L5 x 6 minutes Standing R hip flexion, slide pillowcase forward, then lift foot into hip flexion, slide back. On treadmill, L foot on the side of the belt, BUE support. Push belt backwards and forwards with RLE, required mod A, 5 reps each way. Resisted step up with 4" step for RLE. 10 reps. Improved when she increased her speed of movement. Ambulated 1 x 100' without AD, no unsteadiness Stepping in box over X on the floor with Tband, clockwise, counterclockwise, changing directions upon therapist command, no unsteadiness noted. Ambulated outdoors x 500' without AD, Occasional CGA on unlevel surface, no LOB  06/01/22 NuStep L5 x 6 minutes Stair climbing, had difficulty with R hip flexion to step up with RLE Standing R hip flexion, slide pillowcase forward, then lift foot into hip flexion, slide back. Mini squats in Parallel bars, most weight on R, 10 reps Heel raises with most weight on R, 10 reps Walk with 3# WATE bar, x 150', improved step length. Shuttle walk 10 x 15', moving cones from one surface to the other-much improved gait when she was  distracted.  05/26/22 NuStep L5 x 6 minutes Resisted gait against 20#, 3 reps each direction. Heel raise/Toe raise 2 x 10 reps, No UE support, CGA Ambulated 1 x 80' while chest pressing 2# WATE bar Backwards amb x 20' while chest pressing 2# WATE bar Walking on airex beams forward and back x 4 Side stepping on air ex beam R/L x 4 each Placed R foot on Red physioball in sit, rolling forward and back, side to side x 10 each. Difficulty lifting to place it on ball, and needed Min a to control add Ionto patch to R lateral knee   05/23/22 NuStep L5 x 6 minutes Functional re-assessment performed, see goals for results. Ambulated with 5# weight in BUE to facilitate trunk activation and stability.  05/17/22 NuStep L5 x 6 minutes. LLE step taps onto 4" step, BUE support on RW for balance, CGA, 10 nreps RLE step ups onto 4" step with BUE support, encouraged to minimize BUE  support 10 reps. Seated hip flexion, lifting R foot up onto 4" step x 10, increased effort required. Step ups onto Airex pad and off while holding 2# WATE bar in BUE. Lateral steps ups onto and off Airex pad holding 2# WATE bar, occasional min A for balance. Heel raises while holding 2# WATE bar, 2 x 10 Ambulated while holding 2# WATE bar-forward, back and B side step, turns, CGA, total of approximately 120'. No unsteadiness noted. Ionto to R lateral knee  05/15/22 NuStep L4 with U and LE x 6 minutes Sit to stand x 5 from progressively lower surface, no UE support. Patient reported mild L medial knee pain, but did not fall into IR TKE in stand, 10 each, Green Tband Side to side step against green Tband at knees, 10 each way. Mini tramp- jump feet out/in and alternating stride, 2 x 5 reps each way. With rest breaks. Walking while holding 3# wate bar. Chest press while walking. 55' Iontophoresis to L medial knee.   05/12/22 Walking with 2# WATE bar, 2 x 25', CGA, stp through gait. Standing hold 2# WATE Bar- forward lunges x 8  with each leg, then lateral lunges x 8 with each leg. NuStep L3 x 6 minutes, U and LE Paloff press with R side closest to weight 5#, 10 reps. Mini tramp jumping then jump abd/add. Able speed up movements with repetition, 3 x 5 sets with Bue support and CGA with belt.  05/10/22 Nustep L1-> progressing to L4 x5 minutes R LE only (unable to push with R LE when both legs were on the machine, but able to perform with R LE alone on Nustep)  -Step up onto 4 inch step with BUEs x5 R Mod verbal cues for lifting R LE high enough  -Toe taps on 8 inch step with L LE while maintaining stance R LE U UE support x10  -Standing hip ABD x10 BLEs  - LAQs 3# 1x10 L LE unable R LE even with 0# - seated mini-marches R LE 0#  1x10 - attempted tandem stance in bars- declined to perform without U UE support  - B heel and toe raises BUE support x10 each  - gait training in // bars with U UE progressing to one finger x2 full laps  - side steps in // bars 2 full laps two finger support   05/05/22 NuStep L3 x 6 minutes Supine-RLE press into red physio ball while lifting LLE. Required mod facilitation initially, improved to light min Standing-Step back and out with LLE, turn back and reach with LUE to touch the target behind. 8 times to L and 5 times to R. Required increased A turning to R and mod facilitation to weight shift onto the leg once turned. Gait training with shopping cart, 1 x 200', patient initally with more normalized gait pattern, but as she progressed she started dragging R foot more. Standing at 4" step, place R foot on step. Step ups with BUE support. Improved in her R activation and use with repetition.  Placed L foot on step, R knee flex/ext.  05/03/22 Nustep L3 x22mns  STS w/red ball push out, CGA 2x5 Calf raises holding on to rollator 2x10  Step ups 4" up with L, difficulty picking up R   Walking with weighted shopping cart  [facility]  04/27/22 Nustep level 3 x 6 minutes Ball  b/n knees squeeze LAQ on the left 2.5#, AAROM LAQ on the right Right ankle red  tband PF, AAROM DF right ankle Marching on the left 2.5#, right AAROM Stairs 6" and 4" up and down with CGA, one at a time Supine feet on ball K2C, trunk rotation, small bridges, isometric abs SAQ with left leg assist 3x5  04/25/22 NuStep at L2 x 5 minutes, required Vc and TC to release LLE when attempting to push with R TUG-23.34 BERG 37 Seated knee flexion and ext with R foot on towel to decrease friction-very slow and labored in both directions, x 3 Seated heel raise/toe raise Standing stepping forward and back. Sit to/from stand x 5  PATIENT EDUCATION: Education details: results of re-assessment Person educated: Patient Education method: Explanation and Demonstration Education comprehension: verbalized understanding and returned demonstration   HOME EXERCISE PROGRAM: Verbal-hip flexion, knee ext, ankle DF/PF-all in sitting. Consciously use RLE in all activities as much as possible.  [id number]  GOALS: Goals reviewed with patient? Yes  SHORT TERM GOALS: Target date: 05/23/2022  I with basic HEP Baseline: Goal status: met  2.  5x STS in < 24 seconds Baseline: 41,  05/23/22-19.09 Goal status: met  LONG TERM GOALS: Target date: 07/14/2022  I with final HEP Baseline:  Goal status: met  2.  5 x STS in < 15 seconds Baseline: 41, 05/23/22-19.09, 10/27-11.44 Goal status:met  3.  Increase RLE strength to at least 4-/5 throughout Baseline: 2/5, 3-/5-8/25, 05/23/22-3-/5 with MMT, 10/27- hip (3-)-3+/5, knee 3+/5, ankle 3+/5 Goal status Not met  4.  Paitent will ambulate x at least 400' with LRAD, MI, minimized gait deviations, functional speed. Baseline: 05/23/22-300', Rollator, mildly slow, fatigued, 10/27- 400' with Rollator, MI Goal status: met  5.  Patient will score at least 40/56 on BERG balance assessment. Baseline: 31/56 in hospital, 8/8-37, 05/23/22-42/56 Goal status: met  6.  Paitent will  complete TUG in < 15 sec with LRAD. Baseline: 23.34, 05/23/22-13.38 Goal status: met   ASSESSMENT:  CLINICAL IMPRESSION: Patient continues to improve in her control, still has difficulty with R hip flexion, but improved automatic function. Re-assessed for D/C, HEP updated.  OBJECTIVE IMPAIRMENTS Abnormal gait, decreased activity tolerance, decreased balance, decreased coordination, decreased endurance, decreased mobility, difficulty walking, decreased strength, impaired UE functional use, improper body mechanics, postural dysfunction, and pain.   ACTIVITY LIMITATIONS carrying, lifting, bending, standing, squatting, sleeping, stairs, transfers, bathing, toileting, dressing, reach over head, hygiene/grooming, locomotion level, and caring for others  PARTICIPATION LIMITATIONS: meal prep, cleaning, laundry, shopping, and community activity  PERSONAL FACTORS Age are also affecting patient's functional outcome.   REHAB POTENTIAL: Good  CLINICAL DECISION MAKING: Evolving/moderate complexity  EVALUATION COMPLEXITY: Moderate  PLAN: PT FREQUENCY: 2x/week  PT DURATION: 12 weeks  PLANNED INTERVENTIONS: Therapeutic exercises, Therapeutic activity, Neuromuscular re-education, Balance training, Gait training, Patient/Family education, Self Care, Joint mobilization, Stair training, Ionotophoresis 71m/ml Dexamethasone, and Manual therapy  PLAN FOR NEXT SESSION: Strengthening for RLE, stair training, balance with narrow BOS, speed of movement.    SEthel RanaDPT 07/14/22 9:25 AM

## 2022-09-27 ENCOUNTER — Encounter (HOSPITAL_BASED_OUTPATIENT_CLINIC_OR_DEPARTMENT_OTHER): Payer: Self-pay | Admitting: Emergency Medicine

## 2022-09-27 ENCOUNTER — Emergency Department (HOSPITAL_BASED_OUTPATIENT_CLINIC_OR_DEPARTMENT_OTHER)
Admission: EM | Admit: 2022-09-27 | Discharge: 2022-09-27 | Disposition: A | Discharge status: Home / Self Care | Payer: 59 | Attending: Emergency Medicine | Admitting: Emergency Medicine

## 2022-09-27 ENCOUNTER — Other Ambulatory Visit: Payer: Self-pay

## 2022-09-27 ENCOUNTER — Emergency Department (HOSPITAL_BASED_OUTPATIENT_CLINIC_OR_DEPARTMENT_OTHER): Payer: 59

## 2022-09-27 DIAGNOSIS — M25561 Pain in right knee: Secondary | ICD-10-CM | POA: Insufficient documentation

## 2022-09-27 DIAGNOSIS — R531 Weakness: Secondary | ICD-10-CM | POA: Insufficient documentation

## 2022-09-27 DIAGNOSIS — W1789XA Other fall from one level to another, initial encounter: Secondary | ICD-10-CM | POA: Insufficient documentation

## 2022-09-27 DIAGNOSIS — M25551 Pain in right hip: Secondary | ICD-10-CM | POA: Diagnosis not present

## 2022-09-27 DIAGNOSIS — J45909 Unspecified asthma, uncomplicated: Secondary | ICD-10-CM | POA: Insufficient documentation

## 2022-09-27 DIAGNOSIS — M25511 Pain in right shoulder: Secondary | ICD-10-CM | POA: Diagnosis not present

## 2022-09-27 DIAGNOSIS — Z7982 Long term (current) use of aspirin: Secondary | ICD-10-CM | POA: Insufficient documentation

## 2022-09-27 DIAGNOSIS — W19XXXA Unspecified fall, initial encounter: Secondary | ICD-10-CM

## 2022-09-27 DIAGNOSIS — I10 Essential (primary) hypertension: Secondary | ICD-10-CM | POA: Insufficient documentation

## 2022-09-27 MED ORDER — MELOXICAM 7.5 MG PO TABS: 7.5000 mg | ORAL_TABLET | Freq: Every day | ORAL | 0 refills | Status: AC

## 2022-09-27 NOTE — ED Provider Notes (Signed)
[facility] EMERGENCY DEPARTMENT Provider Note   CSN: [id number] Arrival date & time: 09/27/22  1238     History {Add pertinent medical, surgical, social history, OB history to HPI:1} Chief Complaint  Patient presents with   April Hunt is a 71 y.o. female.   Fall       Home Medications Prior to Admission medications   Medication Sig Start Date End Date Taking? Authorizing Provider  albuterol (VENTOLIN HFA) 108 (90 Base) MCG/ACT inhaler Inhale 1 puff into the lungs every 4 (four) hours as needed for shortness of breath. 06/09/16   [provider]  Alpha-D-Galactosidase (ECK FOOD ENZYME PO) Take 1 tablet by mouth daily.    [provider]  Ascorbic Acid (VITAMIN C) 1000 MG tablet Take 1,000 mg by mouth daily.    [provider]  aspirin EC 81 MG tablet Take 1 tablet (81 mg total) by mouth daily with breakfast. Swallow whole. 04/06/22   Amin, Jeanella Flattery, MD  atorvastatin (LIPITOR) 20 MG tablet Take 1 tablet (20 mg total) by mouth daily. 04/05/22   Amin, Jeanella Flattery, MD  B Complex Vitamins (VITAMIN B-COMPLEX) TABS Take 1 tablet by mouth daily.    [provider]  BIOTIN PO Take 1 tablet by mouth daily.    [provider]  Calcium Carbonate-Vitamin D (CALCIUM-VITAMIN D) 600-200 MG-UNIT CAPS Take 4 capsules by mouth daily.    [provider]  CINNAMON PO Take 2 tablets by mouth daily.    [provider]  co-enzyme Q-10 30 MG capsule Take 30 mg by mouth daily.    [provider]  cyclobenzaprine (FLEXERIL) 10 MG tablet Take 10 mg by mouth 3 (three) times daily as needed for muscle spasms. 11/21/21   [provider]  erythromycin ophthalmic ointment Place 1 Application into both eyes at bedtime. 02/02/21   [provider]  fexofenadine (ALLEGRA) 180 MG tablet Take 180 mg by mouth daily.    [provider]  fluticasone (FLONASE) 50 MCG/ACT nasal spray Place 2 sprays  into the nose daily as needed. For allergies 05/10/13   Alvina Chou, PA-C  gabapentin (NEURONTIN) 100 MG capsule Take 200 mg by mouth at bedtime. 08/11/15   [provider]  guaiFENesin (MUCINEX) 600 MG 12 hr tablet Take 1 tablet (600 mg total) by mouth 2 (two) times daily. Patient taking differently: Take 600 mg by mouth 2 (two) times daily as needed for cough or to loosen phlegm. 11/03/15   [name], [name], PA-C  ketoconazole (NIZORAL) 2 % cream Apply 1 Application topically daily as needed for irritation. 05/23/18   [provider]  L-LYSINE PO Take 1 tablet by mouth daily.    [provider]  linaclotide Rolan Lipa) 290 MCG CAPS capsule Take 290 mcg by mouth daily before breakfast. 01/04/21   [provider]  Multiple Vitamins-Minerals (MULTIVITAMIN WITH MINERALS) tablet Take 1 tablet by mouth daily.    [provider]  nabumetone (RELAFEN) 750 MG tablet Take 750 mg by mouth daily.    [provider]  NON FORMULARY Take 2 capsules by mouth daily. Rosemary capsule    [provider]  NON FORMULARY Take 2 capsules by mouth daily. Horsetail capsule    [provider]  pantoprazole (PROTONIX) 40 MG tablet Take 1 tablet (40 mg total) by mouth daily. 04/06/22   Amin, Jeanella Flattery, MD  Propylene Glycol (SYSTANE COMPLETE) 0.6 % SOLN Place 1 drop into both  eyes daily as needed (dry eyes).    [provider]  Red Yeast Rice 600 MG TABS Take 600 mg by mouth daily.    [provider]  RESTASIS 0.05 % ophthalmic emulsion Place 1 drop into both eyes 2 (two) times daily. 03/07/22   [provider]  vitamin B-12 (CYANOCOBALAMIN) 1000 MCG tablet Take 1,000 mcg by mouth daily.    [provider]      Allergies    Aspirin, Codeine, Doxycycline, and Other    Review of Systems   Review of Systems  Physical Exam Updated Vital Signs BP (!) 153/83 (BP Location: Right Arm)   Pulse 86   Temp 99 F (37.2 C)    Resp 18   Ht 5\' 5"  (1.651 m)   Wt 67.6 kg   SpO2 99%   BMI 24.79 kg/m  Physical Exam  ED Results / Procedures / Treatments   Labs (all labs ordered are listed, but only abnormal results are displayed) Labs Reviewed - No data to display  EKG None  Radiology DG Shoulder Right  Result Date: 09/27/2022 CLINICAL DATA:  Fall EXAM: RIGHT SHOULDER - 2+ VIEW COMPARISON:  02/21/2021 FINDINGS: There is no evidence of fracture or dislocation. Similar degenerative changes of the glenohumeral joint. Soft tissues are unremarkable. IMPRESSION: Negative. Electronically Signed   By: Davina Poke D.O.   On: 09/27/2022 13:34   DG Knee 1-2 Views Right  Result Date: 09/27/2022 CLINICAL DATA:  Fall EXAM: RIGHT KNEE - 1-2 VIEW COMPARISON:  02/21/2021 FINDINGS: Status post right total knee arthroplasty. Arthroplasty components are in their expected alignment without periprosthetic lucency or fracture. Small knee joint effusion versus postoperative synovial thickening. Soft tissues within normal limits. IMPRESSION: 1. Status post right total knee arthroplasty without evidence of hardware complication. 2. Small knee joint effusion versus postoperative synovial thickening. Electronically Signed   By: Davina Poke D.O.   On: 09/27/2022 13:31   DG Hip Unilat W or Wo Pelvis 2-3 Views Right  Result Date: 09/27/2022 CLINICAL DATA:  Fall EXAM: DG HIP (WITH OR WITHOUT PELVIS) 2-3V RIGHT COMPARISON:  02/04/2019 FINDINGS: There is no evidence of hip fracture or dislocation. Osteoarthritis of the bilateral hips, moderate on the left and mild on the right. IMPRESSION: Negative. Electronically Signed   By: Davina Poke D.O.   On: 09/27/2022 13:30    Procedures Procedures  {Document cardiac monitor, telemetry assessment procedure when appropriate:1}  Medications Ordered in ED Medications - No data to display  ED Course/ Medical Decision Making/ A&P                           Medical Decision  Making  ***  {Document critical care time when appropriate:1} {Document review of labs and clinical decision tools ie heart score, Chads2Vasc2 etc:1}  {Document your independent review of radiology images, and any outside records:1} {Document your discussion with family members, caretakers, and with consultants:1} {Document social determinants of health affecting pt's care:1} {Document your decision making why or why not admission, treatments were needed:1} Final Clinical Impression(s) / ED Diagnoses Final diagnoses:  None    Rx / DC Orders ED Discharge Orders     None

## 2022-09-27 NOTE — Discharge Instructions (Signed)
You were seen in the emergency department for a fall. The imaging of your hip, shoulder and knee are all normal. Please return to the emergency department for significant worsening symptoms or if you lose consciousness. Please follow-up with your primary care provider as needed.

## 2022-09-27 NOTE — ED Triage Notes (Signed)
Pt standing on stand reaching for hight object , fell from 3 ft hight , right shoulder pain . Right hip and right knee pain . No loc.

## 2022-09-27 NOTE — ED Provider Triage Note (Signed)
Emergency Medicine Provider Triage Evaluation Note  April Hunt , a 72 y.o. female  was evaluated in triage.  Pt complains of was up on a stool reaching for something in the basement. States that the stool slipped out from under her and she fell onto her right side. Denies striking her head or loss of consciousness. Denies dizziness, lightheadedness, chest pain, shortness of breath prior to or after the fall. Not anticoagulated. Has mild right sided weakness from previous stroke which has not acutely worsened.   Review of Systems  Positive: See above Negative:   Physical Exam  BP (!) 153/83 (BP Location: Right Arm)   Pulse 86   Temp 99 F (37.2 C)   Resp 18   Ht 5\' 5"  (1.651 m)   Wt 67.6 kg   SpO2 99%   BMI 24.79 kg/m  Gen:   Awake, no distress   Resp:  Normal effort  MSK:   Moves extremities without difficulty  Other:    Medical Decision Making  Medically screening exam initiated at 3:54 PM.  Appropriate orders placed.  April Hunt was informed that the remainder of the evaluation will be completed by another provider, this initial triage assessment does not replace that evaluation, and the importance of remaining in the ED until their evaluation is complete.     April Hillier, PA-C 09/28/22 [phone]

## 2023-01-19 ENCOUNTER — Emergency Department (HOSPITAL_BASED_OUTPATIENT_CLINIC_OR_DEPARTMENT_OTHER): Payer: 59

## 2023-01-19 ENCOUNTER — Emergency Department (HOSPITAL_BASED_OUTPATIENT_CLINIC_OR_DEPARTMENT_OTHER)
Admission: EM | Admit: 2023-01-19 | Discharge: 2023-01-19 | Disposition: A | Discharge status: Home / Self Care | Payer: 59 | Attending: Emergency Medicine | Admitting: Emergency Medicine

## 2023-01-19 ENCOUNTER — Encounter (HOSPITAL_BASED_OUTPATIENT_CLINIC_OR_DEPARTMENT_OTHER): Payer: Self-pay | Admitting: Urology

## 2023-01-19 ENCOUNTER — Other Ambulatory Visit: Payer: Self-pay

## 2023-01-19 DIAGNOSIS — M545 Low back pain, unspecified: Secondary | ICD-10-CM | POA: Diagnosis present

## 2023-01-19 DIAGNOSIS — W19XXXA Unspecified fall, initial encounter: Secondary | ICD-10-CM

## 2023-01-19 DIAGNOSIS — M5440 Lumbago with sciatica, unspecified side: Secondary | ICD-10-CM | POA: Insufficient documentation

## 2023-01-19 DIAGNOSIS — Z7982 Long term (current) use of aspirin: Secondary | ICD-10-CM | POA: Insufficient documentation

## 2023-01-19 MED ORDER — ACETAMINOPHEN 500 MG PO TABS
1000.0000 mg | ORAL_TABLET | Freq: Once | ORAL | Status: AC
  Administered 2023-01-19: 1000 mg via ORAL
  Filled 2023-01-19: qty 2

## 2023-01-19 MED ORDER — LIDOCAINE 5 % EX PTCH
1.0000 | MEDICATED_PATCH | CUTANEOUS | Status: DC
  Administered 2023-01-19: 1 via TRANSDERMAL
  Filled 2023-01-19: qty 1

## 2023-01-19 MED ORDER — HYDROCODONE-ACETAMINOPHEN 5-325 MG PO TABS: 1.0000 | ORAL_TABLET | ORAL | 0 refills | Status: AC | PRN

## 2023-01-19 MED ORDER — OXYCODONE HCL 5 MG PO TABS
5.0000 mg | ORAL_TABLET | Freq: Once | ORAL | Status: AC
  Administered 2023-01-19: 5 mg via ORAL
  Filled 2023-01-19: qty 1

## 2023-01-19 MED ORDER — PREDNISONE 20 MG PO TABS: 40.0000 mg | ORAL_TABLET | Freq: Every day | ORAL | 0 refills | Status: AC

## 2023-01-19 MED ORDER — DIAZEPAM 5 MG PO TABS
5.0000 mg | ORAL_TABLET | Freq: Once | ORAL | Status: AC
  Administered 2023-01-19: 5 mg via ORAL
  Filled 2023-01-19: qty 1

## 2023-01-19 NOTE — ED Provider Notes (Signed)
[facility]ERGENCY DEPARTMENT AT [facility] Provider Note   CSN: [id number] Arrival date & time: 01/19/23  1426     History  Chief Complaint  Patient presents with   Fall   Back Pain    April Hunt is a 72 y.o. female.  72 y.o female with a PMH of Lumbar radiculopathy presents to the ED with a chief complaint of low back pain s/p fall x 5 days. Patient reports she was rolling her walker when suddenly she hit a pot on the ground, reports falling forward and flipping over striking her lower back.  Reports exacerbated pain to the lumbar spine worsened with ambulation along with sitting down, has tried ibuprofen, ice, heat without any improvement in symptoms.  She did not strike her head and did not lose conscious and is currently on no blood thinners.  She does have an underlying history of lumbar radiculopathy, supposed to start physical therapy next week however she has not been able to get any relief due to her pain. Patient is currently followed by pain management but reports the only medication she is getting currently is gabapentin which is not helping with her symptoms.  The history is provided by the patient and medical records.  Fall Pertinent negatives include no chest pain, no abdominal pain and no shortness of breath.  Back Pain Associated symptoms: no abdominal pain, no chest pain and no fever        Home Medications Prior to Admission medications   Medication Sig Start Date End Date Taking? Authorizing Provider  HYDROcodone-acetaminophen (NORCO/VICODIN) 5-325 MG tablet Take 1 tablet by mouth every 4 (four) hours as needed for up to 3 days. 01/19/23 01/22/23 Yes Keylor Rands, PA-C  predniSONE (DELTASONE) 20 MG tablet Take 2 tablets (40 mg total) by mouth daily for 5 days. 01/19/23 01/24/23 Yes Holli Rengel, PA-C  albuterol (VENTOLIN HFA) 108 (90 Base) MCG/ACT inhaler Inhale 1 puff into the lungs every 4 (four) hours as needed for shortness of breath. 06/09/16    [provider]  Alpha-D-Galactosidase (ECK FOOD ENZYME PO) Take 1 tablet by mouth daily.    [provider]  Ascorbic Acid (VITAMIN C) 1000 MG tablet Take 1,000 mg by mouth daily.    [provider]  aspirin EC 81 MG tablet Take 1 tablet (81 mg total) by mouth daily with breakfast. Swallow whole. 04/06/22   Amin, Loura Halt, MD  atorvastatin (LIPITOR) 20 MG tablet Take 1 tablet (20 mg total) by mouth daily. 04/05/22   Amin, Loura Halt, MD  B Complex Vitamins (VITAMIN B-COMPLEX) TABS Take 1 tablet by mouth daily.    [provider]  BIOTIN PO Take 1 tablet by mouth daily.    [provider]  Calcium Carbonate-Vitamin D (CALCIUM-VITAMIN D) 600-200 MG-UNIT CAPS Take 4 capsules by mouth daily.    [provider]  CINNAMON PO Take 2 tablets by mouth daily.    [provider]  co-enzyme Q-10 30 MG capsule Take 30 mg by mouth daily.    [provider]  cyclobenzaprine (FLEXERIL) 10 MG tablet Take 10 mg by mouth 3 (three) times daily as needed for muscle spasms. 11/21/21   [provider]  erythromycin ophthalmic ointment Place 1 Application into both eyes at bedtime. 02/02/21   [provider]  fexofenadine (ALLEGRA) 180 MG tablet Take 180 mg by mouth daily.    [provider]  fluticasone (FLONASE) 50 MCG/ACT nasal spray Place 2 sprays into  the nose daily as needed. For allergies 05/10/13   Emilia Beck, PA-C  gabapentin (NEURONTIN) 100 MG capsule Take 200 mg by mouth at bedtime. 08/11/15   [provider]  guaiFENesin (MUCINEX) 600 MG 12 hr tablet Take 1 tablet (600 mg total) by mouth 2 (two) times daily. Patient taking differently: Take 600 mg by mouth 2 (two) times daily as needed for cough or to loosen phlegm. 11/03/15   [name], [name], PA-C  ketoconazole (NIZORAL) 2 % cream Apply 1 Application topically daily as needed for irritation. 05/23/18   [provider]  L-LYSINE PO Take 1  tablet by mouth daily.    [provider]  linaclotide Karlene Einstein) 290 MCG CAPS capsule Take 290 mcg by mouth daily before breakfast. 01/04/21   [provider]  meloxicam (MOBIC) 7.5 MG tablet Take 1 tablet (7.5 mg total) by mouth daily. 09/27/22   Cristopher Peru, PA-C  Multiple Vitamins-Minerals (MULTIVITAMIN WITH MINERALS) tablet Take 1 tablet by mouth daily.    [provider]  nabumetone (RELAFEN) 750 MG tablet Take 750 mg by mouth daily.    [provider]  NON FORMULARY Take 2 capsules by mouth daily. Rosemary capsule    [provider]  NON FORMULARY Take 2 capsules by mouth daily. Horsetail capsule    [provider]  pantoprazole (PROTONIX) 40 MG tablet Take 1 tablet (40 mg total) by mouth daily. 04/06/22   Amin, Loura Halt, MD  Propylene Glycol (SYSTANE COMPLETE) 0.6 % SOLN Place 1 drop into both eyes daily as needed (dry eyes).    [provider]  Red Yeast Rice 600 MG TABS Take 600 mg by mouth daily.    [provider]  RESTASIS 0.05 % ophthalmic emulsion Place 1 drop into both eyes 2 (two) times daily. 03/07/22   [provider]  vitamin B-12 (CYANOCOBALAMIN) 1000 MCG tablet Take 1,000 mcg by mouth daily.    [provider]      Allergies    Aspirin, Codeine, Doxycycline, and Other    Review of Systems   Review of Systems  Constitutional:  Negative for fever.  Respiratory:  Negative for shortness of breath.   Cardiovascular:  Negative for chest pain.  Gastrointestinal:  Negative for abdominal pain, nausea and vomiting.  Musculoskeletal:  Positive for back pain and myalgias.  All other systems reviewed and are negative.   Physical Exam Updated Vital Signs BP (!) 174/73 (BP Location: Left Arm)   Pulse (!) 104   Temp 98.5 F (36.9 C) (Oral)   Resp 18   Ht 5\' 5"  (1.651 m)   Wt 67.6 kg   SpO2 100%   BMI 24.80 kg/m  Physical Exam Vitals and nursing note reviewed.  Constitutional:       Appearance: Normal appearance.  HENT:     Head: Normocephalic and atraumatic.     Mouth/Throat:     Mouth: Mucous membranes are moist.  Eyes:     Pupils: Pupils are equal, round, and reactive to light.  Cardiovascular:     Rate and Rhythm: Normal rate.  Pulmonary:     Effort: Pulmonary effort is normal.  Abdominal:     General: Abdomen is flat.  Musculoskeletal:        General: Tenderness present.     Cervical back: Normal range of motion and neck supple.     Lumbar back: Spasms, tenderness and bony tenderness present. Decreased range of motion.     Comments: Midline  tenderness to the lumbar spine, decrease ROM due to pain worst right > left. Sensation is intact. Antalgic gait.   Skin:    General: Skin is warm and dry.  Neurological:     Mental Status: She is alert and oriented to person, place, and time.     ED Results / Procedures / Treatments   Labs (all labs ordered are listed, but only abnormal results are displayed) Labs Reviewed - No data to display  EKG None  Radiology CT Lumbar Spine Wo Contrast  Result Date: 01/19/2023 CLINICAL DATA:  Lumbar radiculopathy, trauma. Low back pain radiating down the right leg. Recent fall. EXAM: CT LUMBAR SPINE WITHOUT CONTRAST TECHNIQUE: Multidetector CT imaging of the lumbar spine was performed without intravenous contrast administration. Multiplanar CT image reconstructions were also generated. RADIATION DOSE REDUCTION: This exam was performed according to the departmental dose-optimization program which includes automated exposure control, adjustment of the mA and/or kV according to patient size and/or use of iterative reconstruction technique. COMPARISON:  Lumbar spine MRI 04/05/2022 FINDINGS: Segmentation: 5 lumbar type vertebrae. Alignment: Straightening of the normal lumbar lordosis. Mild lumbar dextroscoliosis. Chronic trace retrolisthesis of L1 on L2 and L2 on L3. Vertebrae: No acute fracture or suspicious osseous lesion.  Prior L4-5 PLIF with solid arthrodesis. Paraspinal and other soft tissues: Large left upper pole renal cyst, partially covered on the prior MRI and measuring approximately 6.3 cm today with no follow-up imaging recommended. Mild abdominal aortic atherosclerosis without aneurysm. Disc levels: Multilevel disc and facet degeneration. Severe right neural foraminal stenosis at L5-S1 due to a chronic right foraminal disc extrusion and advanced facet arthrosis with resultant right L5 nerve impingement, suspected to have progressed from the prior MRI. Left lateral recess stenosis and moderate to severe left neural foraminal stenosis at L3-4 due to disc bulging, a chronic left foraminal disc protrusion, and facet hypertrophy. IMPRESSION: 1. No acute osseous abnormality. 2. 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 3. Moderate to severe left neural foraminal stenosis at L3-4. 4.  Aortic Atherosclerosis (ICD10-I70.0). Electronically Signed   By: Sebastian Ache M.D.   On: 01/19/2023 15:51    Procedures Procedures    Medications Ordered in ED Medications  lidocaine (LIDODERM) 5 % 1 patch (1 patch Transdermal Patch Applied 01/19/23 1532)  oxyCODONE (Oxy IR/ROXICODONE) immediate release tablet 5 mg (5 mg Oral Given 01/19/23 1532)  acetaminophen (TYLENOL) tablet 1,000 mg (1,000 mg Oral Given 01/19/23 1532)  diazepam (VALIUM) tablet 5 mg (5 mg Oral Given 01/19/23 1532)    ED Course/ Medical Decision Making/ A&P                             Medical Decision Making Amount and/or Complexity of Data Reviewed Radiology: ordered.  Risk OTC drugs. Prescription drug management.    Patient here with low back pain s/p fall x 5 days ago. Rolled over from her walker, and exacerbation of her back pain since this fall.  Reports taking ibuprofen, gabapentin, multiple over-the-counter medications without any improvement in her symptoms.  She  did just have imaging last month by her orthopedist Dr. Christell Constant, in addition she was seen by a spine specialist recommended physical therapy along with perhaps further surgical intervention if she fails this.  Exam with worsen weakness with RIGHT leg extension, palpable pain along the midline. No bruising noted. Given medication to help with symptomatic  treatment.   CT lumbar spine obtained: IMPRESSION:  1. No acute osseous abnormality.  2. 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  3. Moderate to severe left neural foraminal stenosis at L3-4    These results were discussed at length with patient, she is currently followed by a spine specialist over at [facility], she is going to be doing physical therapy in order to help with her back, however they did discuss surgical intervention as well.  She was given medication to help with acute on chronic pain after her fall, we discussed a short course of steroids along with pain medication, she is also on gabapentin at home.  Patient is agreeable to plan and treatment, stable for discharge.   Portions of this note were generated with Scientist, clinical (histocompatibility and immunogenetics). Dictation errors may occur despite best attempts at proofreading.   Final Clinical Impression(s) / ED Diagnoses Final diagnoses:  Fall, initial encounter  Acute midline low back pain with sciatica, sciatica laterality unspecified    Rx / DC Orders ED Discharge Orders          Ordered    predniSONE (DELTASONE) 20 MG tablet  Daily        01/19/23 1557    HYDROcodone-acetaminophen (NORCO/VICODIN) 5-325 MG tablet  Every 4 hours PRN        01/19/23 1606              Claude Manges, PA-C 01/19/23 1608    Virgina Norfolk, DO 01/22/23 0701

## 2023-01-19 NOTE — Discharge Instructions (Addendum)
You were given a short prescription of steroids to help with your pain, please take 2 tablets daily for the next 7 days.  I have also provided stronger medication to help with your symptoms, please take this as prescribed.    Follow up with your spine specialist at your earliest convenience.

## 2023-01-19 NOTE — ED Triage Notes (Signed)
Pt states fall on Monday and having lower back pain radiating down right leg to foot  Denies head injury with fall or LOC   H/o sciatica with surgery

## 2023-01-19 NOTE — ED Notes (Signed)
Patient transported to CT 

## 2023-01-19 NOTE — ED Notes (Signed)
Discharge paperwork reviewed entirely with patient, including follow up care. Pain was under control. The patient received instruction and coaching on their prescriptions, and all follow-up questions were answered.  Pt verbalized understanding as well as all parties involved. No questions or concerns voiced at the time of discharge. No acute distress noted.   The pt walked out with her wheeled walker without incident. Ambulatory without issue, self powered. Pt went to the bathroom then departed the ED after waiting for her ride.

## 2023-07-30 NOTE — Progress Notes (Signed)
CARDIOLOGY CONSULT NOTE       Patient ID: April Hunt MRN: [id number] DOB/AGE: Dec 12, 1950 72 y.o.  Referring Physician: [name] Primary Physician: [name], NP Primary Cardiologist: New Reason for Consultation: Tachcyardia   HPI:  72 y.o. with chronic back pain, asthma referred for tachycardia. Seen by Primary 03/28/23 complained of higher HR;s with watch indicating ? Afib. Normally HR in 70-80 range and at time for about 2 weeks seemed 90-110 bpm using nebulizer more with some dyspnea. No chest pain or syncope TSH was normal Hct 37.8 09/05/22 She is a high energy excitable person. 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 Last Assessment & Plan:  Currently stable with PRN albuterol.   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   Pure hypercholesterolemia    Refusal of blood transfusions as patient is Jehovah's Witness    Sinusitis 03/04/2013   Tinea pedis 03/19/2012   Tinea versicolor 03/19/2012    Family History  Problem Relation Age of Onset   Hyperlipidemia Mother    Hypertension Mother    Diabetes Father    Tuberculosis Father    Heart disease Paternal Aunt    Cancer Paternal Uncle        colon cancer   Cancer Cousin        breast    Social History   Socioeconomic History   Marital status: Divorced    Spouse name: Not on file   Number of children: 1   Years of education: Not on file   Highest education level: Not on file  Occupational History   Occupation: TEACHER    Employer: [employer]  Tobacco Use   Smoking status: Never   Smokeless tobacco: Never  Vaping Use   Vaping status: Never Used  Substance and Sexual Activity   Alcohol use: Not Currently    Comment: rarely   Drug use: No   Sexual activity: Not on file  Other Topics Concern   Not on file  Social History Narrative   Caffeine Use: "all the time"   Regular exercise:  Walks daily.  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   Divorced   She has one son age 40.   Lives with her mother and her dog [name]   Social Determinants of Health   Financial Resource Strain: Patient Declined (01/22/2023)   Received from [facility], [facility]   Overall Financial Resource Strain (CARDIA)    Difficulty of Paying Living Expenses: Patient declined  Food Insecurity: Patient Declined (01/22/2023)   Received from [facility], [facility]   Hunger Vital Sign    Worried About Running Out of Food in the Last Year: Patient declined    Ran Out of Food in the Last Year: Patient declined  Transportation Needs: Patient Declined (01/22/2023)   Received from [facility], [facility]   PRAPARE - Transportation    Lack of Transportation (Medical): Patient  declined    Lack of Transportation (Non-Medical): Patient declined  Physical Activity: Not on file  Stress: Not on file  Social Connections: Unknown (01/15/2022)   Received from [facility], [facility]   Social Network    Social Network: Not on file  Intimate Partner Violence: Unknown (12/20/2021)   Received from [facility], [facility]   HITS    Physically Hurt: Not on file    Insult or Talk Down To: Not on file    Threaten Physical Harm: Not on file    Scream or Curse: Not on file    Past Surgical History:  Procedure Laterality Date   ABDOMINAL HYSTERECTOMY  1998   Still has cervix and both ovaries   APPENDECTOMY     BACK SURGERY     JOINT REPLACEMENT     scope bil knees   KNEE ARTHROSCOPY  1997   right knee   Lymph node resection  1999   benign   MENISCUS REPAIR  1995   left knee   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   DR CAFFREY   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   Procedure: RIGHT TOTAL KNEE ARTHROPLASTY;  Surgeon: Thera Flake., MD;  Location: [facility] OR;  Service: Orthopedics;  Laterality: Right;      Current Outpatient Medications:    albuterol (VENTOLIN HFA) 108 (90 Base) MCG/ACT inhaler, Inhale 1 puff into the lungs every 4 (four) hours as needed for shortness of breath., Disp: , Rfl:    Alpha-D-Galactosidase (ECK FOOD ENZYME PO), Take 1 tablet by mouth daily., Disp: , Rfl:    amoxicillin (AMOXIL) 500 MG tablet, SMARTSIG:4 Tablet(s) By Mouth, Disp: , Rfl:    Ascorbic Acid (VITAMIN C) 1000 MG tablet, Take 1,000 mg by mouth daily., Disp: , Rfl:    aspirin EC 81 MG tablet, Take 1 tablet (81 mg total) by mouth daily with breakfast. Swallow whole., Disp: 30 tablet, Rfl: 3   atorvastatin (LIPITOR) 20 MG tablet, Take 1 tablet (20 mg total) by mouth daily., Disp: 30 tablet, Rfl: 0   B Complex Vitamins (VITAMIN B-COMPLEX) TABS, Take 1 tablet by mouth daily., Disp: , Rfl:    benazepril-hydrochlorthiazide (LOTENSIN HCT) 20-25 MG tablet, Take 1 tablet by  mouth daily., Disp: , Rfl:    BIOTIN PO, Take 1 tablet by mouth daily., Disp: , Rfl:    Calcium Carbonate-Vitamin D (CALCIUM-VITAMIN D) 600-200 MG-UNIT CAPS, Take 4 capsules by mouth daily., Disp: , Rfl:    CINNAMON PO, Take 2 tablets by mouth daily., Disp: , Rfl:    co-enzyme Q-10 30 MG capsule, Take 30 mg by mouth daily., Disp: ,  Rfl:    cyclobenzaprine (FLEXERIL) 10 MG tablet, Take 10 mg by mouth 3 (three) times daily as needed for muscle spasms., Disp: , Rfl:    erythromycin ophthalmic ointment, Place 1 Application into both eyes at bedtime., Disp: , Rfl:    estradiol (ESTRACE) 0.1 MG/GM vaginal cream, Place vaginally., Disp: , Rfl:    famotidine (PEPCID) 40 MG tablet, Take by mouth., Disp: , Rfl:    fexofenadine (ALLEGRA) 180 MG tablet, Take 180 mg by mouth daily., Disp: , Rfl:    fluticasone (FLONASE) 50 MCG/ACT nasal spray, Place 2 sprays into the nose daily as needed. For allergies, Disp: , Rfl:    gabapentin (NEURONTIN) 100 MG capsule, Take 200 mg by mouth at bedtime., Disp: , Rfl:    guaiFENesin (MUCINEX) 600 MG 12 hr tablet, Take 1 tablet (600 mg total) by mouth 2 (two) times daily. (Patient taking differently: Take 600 mg by mouth 2 (two) times daily as needed for cough or to loosen phlegm.), Disp: 15 tablet, Rfl: 0   hydrochlorothiazide (MICROZIDE) 12.5 MG capsule, Take by mouth., Disp: , Rfl:    ibuprofen (ADVIL) 600 MG tablet, Take by mouth., Disp: , Rfl:    ipratropium (ATROVENT) 0.06 % nasal spray, 1 spray 2 (two) times a day for 30 days., Disp: , Rfl:    ketoconazole (NIZORAL) 2 % cream, Apply 1 Application topically daily as needed for irritation., Disp: , Rfl:    L-LYSINE PO, Take 1 tablet by mouth daily., Disp: , Rfl:    linaclotide (LINZESS) 290 MCG CAPS capsule, Take 290 mcg by mouth daily before breakfast., Disp: , Rfl:    loratadine (CLARITIN) 10 MG tablet, Take by mouth., Disp: , Rfl:    meclizine (ANTIVERT) 12.5 MG tablet, TAKE 1 TABLET(12.5 MG) BY MOUTH THREE TIMES  DAILY AS NEEDED, Disp: , Rfl:    meloxicam (MOBIC) 7.5 MG tablet, Take 1 tablet (7.5 mg total) by mouth daily., Disp: 12 tablet, Rfl: 0   methocarbamol (ROBAXIN) 500 MG tablet, Take by mouth., Disp: , Rfl:    montelukast (SINGULAIR) 10 MG tablet, Take by mouth., Disp: , Rfl:    Multiple Vitamins-Minerals (MULTIVITAMIN WITH MINERALS) tablet, Take 1 tablet by mouth daily., Disp: , Rfl:    nabumetone (RELAFEN) 750 MG tablet, Take 750 mg by mouth daily., Disp: , Rfl:    NON FORMULARY, Take 2 capsules by mouth daily. Rosemary capsule, Disp: , Rfl:    NON FORMULARY, Take 2 capsules by mouth daily. Horsetail capsule, Disp: , Rfl:    pantoprazole (PROTONIX) 20 MG tablet, Take by mouth., Disp: , Rfl:    pregabalin (LYRICA) 50 MG capsule, Take by mouth., Disp: , Rfl:    Propylene Glycol (SYSTANE COMPLETE) 0.6 % SOLN, Place 1 drop into both eyes daily as needed (dry eyes)., Disp: , Rfl:    Red Yeast Rice 600 MG TABS, Take 600 mg by mouth daily., Disp: , Rfl:    RESTASIS 0.05 % ophthalmic emulsion, Place 1 drop into both eyes 2 (two) times daily., Disp: , Rfl:    valACYclovir (VALTREX) 1000 MG tablet, Take by mouth., Disp: , Rfl:    vitamin B-12 (CYANOCOBALAMIN) 1000 MCG tablet, Take 1,000 mcg by mouth daily., Disp: , Rfl:     Physical Exam: Blood pressure 134/76, pulse 83, height 5\' 5"  (1.651 m), weight 143 lb 9.6 oz (65.1 kg), SpO2 94%.    Affect appropriate Healthy:  appears stated age HEENT: normal Neck supple with no adenopathy JVP normal no  bruits no thyromegaly Lungs clear with no wheezing and good diaphragmatic motion Heart:  S1/S2 no murmur, no rub, gallop or click PMI normal Abdomen: benighn, BS positve, no tenderness, no AAA no bruit.  No HSM or HJR Distal pulses intact with no bruits No edema Neuro non-focal Skin warm and dry No muscular weakness   Labs:   Lab Results  Component Value Date   WBC 5.0 04/05/2022   HGB 13.3 04/05/2022   HCT 39.6 04/05/2022   MCV 94.5  04/05/2022   PLT 236 04/05/2022   No results for input(s): "NA", "K", "CL", "CO2", "BUN", "CREATININE", "CALCIUM", "PROT", "BILITOT", "ALKPHOS", "ALT", "AST", "GLUCOSE" in the last 168 hours.  Invalid input(s): "LABALBU" No results found for: "CKTOTAL", "CKMB", "CKMBINDEX", "TROPONINI"  Lab Results  Component Value Date   CHOL 272 (H) 04/05/2022   CHOL 209 (H) 03/19/2012   Lab Results  Component Value Date   HDL 106 04/05/2022   HDL 74 03/19/2012   Lab Results  Component Value Date   LDLCALC 155 (H) 04/05/2022   LDLCALC 121 (H) 03/19/2012   Lab Results  Component Value Date   TRIG 57 04/05/2022   TRIG 70 03/19/2012   Lab Results  Component Value Date   CHOLHDL 2.6 04/05/2022   CHOLHDL 2.8 03/19/2012   No results found for: "LDLDIRECT"    Radiology: No results found.  EKG: SR rate 98 normal 04/03/22  Today SR Rate 80 normal    ASSESSMENT AND PLAN:   Tachycardia:  perceived Hct/TSH ok ECG normal 30 day monitor and TTE ordered to further assess  HTN:  ? On diuretic given above issues start low dose beta blocker Asthma:  albuterol PRN no active wheezing HLD:  continue statin labs with primary   TTE 30 day monitor   F/U PRN pending studies  Signed: Charlton Haws 08/07/2023, 11:23 AM

## 2023-08-07 ENCOUNTER — Encounter: Payer: Self-pay | Admitting: Cardiovascular Disease

## 2023-08-07 ENCOUNTER — Encounter: Payer: Self-pay | Admitting: *Deleted

## 2023-08-07 ENCOUNTER — Ambulatory Visit: Payer: 59 | Attending: Cardiovascular Disease | Admitting: Cardiovascular Disease

## 2023-08-07 VITALS — BP 134/76 | HR 83 | Ht 65.0 in | Wt 143.6 lb

## 2023-08-07 DIAGNOSIS — I10 Essential (primary) hypertension: Secondary | ICD-10-CM | POA: Diagnosis not present

## 2023-08-07 DIAGNOSIS — E782 Mixed hyperlipidemia: Secondary | ICD-10-CM

## 2023-08-07 DIAGNOSIS — R000 Tachycardia, unspecified: Secondary | ICD-10-CM

## 2023-08-07 NOTE — Patient Instructions (Signed)
Medication Instructions:  Your physician recommends that you continue on your current medications as directed. Please refer to the Current Medication list given to you today.  *If you need a refill on your cardiac medications before your next appointment, please call your pharmacy*  Lab Work: If you have labs (blood work) drawn today and your tests are completely normal, you will receive your results only by: MyChart Message (if you have MyChart) OR A paper copy in the mail If you have any lab test that is abnormal or we need to change your treatment, we will call you to review the results.  Testing/Procedures: Your physician has requested that you have an echocardiogram. Echocardiography is a painless test that uses sound waves to create images of your heart. It provides your doctor with information about the size and shape of your heart and how well your heart's chambers and valves are working. This procedure takes approximately one hour. There are no restrictions for this procedure. Please do NOT wear cologne, perfume, aftershave, or lotions (deodorant is allowed). Please arrive 15 minutes prior to your appointment time.  Please note: We ask at that you not bring children with you during ultrasound (echo/ vascular) testing. Due to room size and safety concerns, children are not allowed in the ultrasound rooms during exams. Our front office staff cannot provide observation of children in our lobby area while testing is being conducted. An adult accompanying a patient to their appointment will only be allowed in the ultrasound room at the discretion of the ultrasound technician under special circumstances. We apologize for any inconvenience. Your physician has recommended that you wear an event monitor. Event monitors are medical devices that record the heart's electrical activity. Doctors most often [place] these monitors to diagnose arrhythmias. Arrhythmias are problems with the speed or rhythm of the  heartbeat. The monitor is a small, portable device. You can wear one while you do your normal daily activities. This is usually used to diagnose what is causing palpitations/syncope (passing out).   Follow-Up: At [facility], you and your health needs are our priority.  As part of our continuing mission to provide you with exceptional heart care, we have created designated Provider Care Teams.  These Care Teams include your primary Cardiologist (physician) and Advanced Practice Providers (APPs -  Physician Assistants and Nurse Practitioners) who all work together to provide you with the care you need, when you need it.  We recommend signing up for the patient portal called "MyChart".  Sign up information is provided on this After Visit Summary.  MyChart is used to connect with patients for Virtual Visits (Telemedicine).  Patients are able to view lab/test results, encounter notes, upcoming appointments, etc.  Non-urgent messages can be sent to your provider as well.   To learn more about what you can do with MyChart, go to [email or web address].    Your next appointment:   As needed  Provider:   Charlton Haws, MD

## 2023-08-07 NOTE — Progress Notes (Signed)
Patient enrolled for [employer] to ship a 30 day cardiac event monitor to her address on file. 

## 2023-08-14 DIAGNOSIS — R000 Tachycardia, unspecified: Secondary | ICD-10-CM | POA: Diagnosis not present

## 2023-09-03 ENCOUNTER — Ambulatory Visit (HOSPITAL_COMMUNITY): Payer: 59 | Attending: Cardiovascular Disease

## 2023-09-03 DIAGNOSIS — R002 Palpitations: Secondary | ICD-10-CM | POA: Diagnosis not present

## 2023-09-03 DIAGNOSIS — R000 Tachycardia, unspecified: Secondary | ICD-10-CM | POA: Diagnosis present

## 2023-09-03 DIAGNOSIS — E7800 Pure hypercholesterolemia, unspecified: Secondary | ICD-10-CM | POA: Insufficient documentation

## 2023-09-03 DIAGNOSIS — I10 Essential (primary) hypertension: Secondary | ICD-10-CM | POA: Diagnosis not present

## 2023-09-03 LAB — ECHOCARDIOGRAM COMPLETE
Area-P 1/2: 3.56 cm2
S' Lateral: 2.6 cm

## 2023-09-20 ENCOUNTER — Ambulatory Visit: Payer: 59 | Attending: Cardiovascular Disease

## 2023-09-20 DIAGNOSIS — R000 Tachycardia, unspecified: Secondary | ICD-10-CM
